# Patient Record
Sex: Female | Born: 1995 | Race: Black or African American | Hispanic: No | Marital: Single | Smoking: Former smoker
Health system: Southern US, Community
[De-identification: ages and names within clinical notes are randomized; demographics above are authoritative.]

## PROBLEM LIST (undated history)

## (undated) ENCOUNTER — Inpatient Hospital Stay (HOSPITAL_COMMUNITY): Payer: Self-pay

## (undated) ENCOUNTER — Emergency Department (HOSPITAL_COMMUNITY): Admission: EM | Source: Home / Self Care

## (undated) DIAGNOSIS — M1990 Unspecified osteoarthritis, unspecified site: Secondary | ICD-10-CM

## (undated) DIAGNOSIS — J45909 Unspecified asthma, uncomplicated: Secondary | ICD-10-CM

## (undated) HISTORY — PX: WISDOM TOOTH EXTRACTION: SHX21

## (undated) HISTORY — PX: TONSILLECTOMY: SUR1361

## (undated) HISTORY — PX: ADENOIDECTOMY: SUR15

---
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
## 2014-02-12 ENCOUNTER — Emergency Department (HOSPITAL_COMMUNITY)
Admission: EM | Admit: 2014-02-12 | Discharge: 2014-02-12 | Disposition: A | Discharge status: Home / Self Care | Payer: Medicaid Other | Attending: Emergency Medicine | Admitting: Emergency Medicine

## 2014-02-12 ENCOUNTER — Encounter (HOSPITAL_COMMUNITY): Payer: Self-pay | Admitting: *Deleted

## 2014-02-12 DIAGNOSIS — J029 Acute pharyngitis, unspecified: Secondary | ICD-10-CM | POA: Diagnosis present

## 2014-02-12 DIAGNOSIS — Z880 Allergy status to penicillin: Secondary | ICD-10-CM | POA: Insufficient documentation

## 2014-02-12 DIAGNOSIS — J329 Chronic sinusitis, unspecified: Secondary | ICD-10-CM | POA: Diagnosis not present

## 2014-02-12 DIAGNOSIS — B349 Viral infection, unspecified: Secondary | ICD-10-CM | POA: Diagnosis not present

## 2014-02-12 DIAGNOSIS — Z79899 Other long term (current) drug therapy: Secondary | ICD-10-CM | POA: Insufficient documentation

## 2014-02-12 DIAGNOSIS — H7493 Unspecified disorder of middle ear and mastoid, bilateral: Secondary | ICD-10-CM | POA: Diagnosis not present

## 2014-02-12 DIAGNOSIS — Z7952 Long term (current) use of systemic steroids: Secondary | ICD-10-CM | POA: Insufficient documentation

## 2014-02-12 DIAGNOSIS — B9789 Other viral agents as the cause of diseases classified elsewhere: Secondary | ICD-10-CM

## 2014-02-12 DIAGNOSIS — J45909 Unspecified asthma, uncomplicated: Secondary | ICD-10-CM | POA: Diagnosis not present

## 2014-02-12 DIAGNOSIS — Z7951 Long term (current) use of inhaled steroids: Secondary | ICD-10-CM | POA: Diagnosis not present

## 2014-02-12 DIAGNOSIS — R1110 Vomiting, unspecified: Secondary | ICD-10-CM | POA: Diagnosis not present

## 2014-02-12 DIAGNOSIS — M1990 Unspecified osteoarthritis, unspecified site: Secondary | ICD-10-CM | POA: Insufficient documentation

## 2014-02-12 DIAGNOSIS — R51 Headache: Secondary | ICD-10-CM | POA: Diagnosis not present

## 2014-02-12 MED ORDER — ONDANSETRON HCL 4 MG PO TABS: 4.0000 mg | ORAL_TABLET | Freq: Four times a day (QID) | ORAL | Status: DC

## 2014-02-12 MED ORDER — FLUTICASONE PROPIONATE 50 MCG/ACT NA SUSP: 2.0000 | Freq: Every day | NASAL | Status: DC

## 2014-02-12 MED ORDER — NAPROXEN 500 MG PO TABS
500.0000 mg | ORAL_TABLET | Freq: Once | ORAL | Status: AC
  Administered 2014-02-12: 500 mg via ORAL
  Filled 2014-02-12: qty 1

## 2014-02-12 MED ORDER — NAPROXEN 500 MG PO TABS: 500.0000 mg | ORAL_TABLET | Freq: Two times a day (BID) | ORAL | Status: DC

## 2014-02-12 MED ORDER — SALINE SPRAY 0.65 % NA SOLN
1.0000 | Freq: Once | NASAL | Status: AC
  Administered 2014-02-12: 1 via NASAL
  Filled 2014-02-12: qty 44

## 2014-02-12 MED ORDER — TRAMADOL HCL 50 MG PO TABS
50.0000 mg | ORAL_TABLET | Freq: Once | ORAL | Status: AC
  Administered 2014-02-12: 50 mg via ORAL
  Filled 2014-02-12: qty 1

## 2014-02-12 MED ORDER — ONDANSETRON HCL 4 MG PO TABS
4.0000 mg | ORAL_TABLET | Freq: Once | ORAL | Status: AC
  Administered 2014-02-12: 4 mg via ORAL
  Filled 2014-02-12: qty 1

## 2014-02-12 NOTE — Discharge Instructions (Signed)
Recommend that you use Flonase, 2 sprays in each nostril daily, for sinus congestion. Take naproxen for body aches and headache. You may also take this for fever. Take Zofran as prescribed for nausea to prevent further vomiting. Be sure to drink plenty of fluids and get plenty of rest. Follow-up with your primary care doctor to ensure resolution of symptoms.  Sinusitis Sinusitis is redness, soreness, and inflammation of the paranasal sinuses. Paranasal sinuses are air pockets within the bones of your face (beneath the eyes, the middle of the forehead, or above the eyes). In healthy paranasal sinuses, mucus is able to drain out, and air is able to circulate through them by way of your nose. However, when your paranasal sinuses are inflamed, mucus and air can become trapped. This can allow bacteria and other germs to grow and cause infection. Sinusitis can develop quickly and last only a short time (acute) or continue over a long period (chronic). Sinusitis that lasts for more than 12 weeks is considered chronic.  CAUSES  Causes of sinusitis include:  Allergies.  Structural abnormalities, such as displacement of the cartilage that separates your nostrils (deviated septum), which can decrease the air flow through your nose and sinuses and affect sinus drainage.  Functional abnormalities, such as when the small hairs (cilia) that line your sinuses and help remove mucus do not work properly or are not present. SIGNS AND SYMPTOMS  Symptoms of acute and chronic sinusitis are the same. The primary symptoms are pain and pressure around the affected sinuses. Other symptoms include:  Upper toothache.  Earache.  Headache.  Bad breath.  Decreased sense of smell and taste.  A cough, which worsens when you are lying flat.  Fatigue.  Fever.  Thick drainage from your nose, which often is green and may contain pus (purulent).  Swelling and warmth over the affected sinuses. DIAGNOSIS  Your health  care provider will perform a physical exam. During the exam, your health care provider may:  Look in your nose for signs of abnormal growths in your nostrils (nasal polyps).  Tap over the affected sinus to check for signs of infection.  View the inside of your sinuses (endoscopy) using an imaging device that has a light attached (endoscope). If your health care provider suspects that you have chronic sinusitis, one or more of the following tests may be recommended:  Allergy tests.  Nasal culture. A sample of mucus is taken from your nose, sent to a lab, and screened for bacteria.  Nasal cytology. A sample of mucus is taken from your nose and examined by your health care provider to determine if your sinusitis is related to an allergy. TREATMENT  Most cases of acute sinusitis are related to a viral infection and will resolve on their own within 10 days. Sometimes medicines are prescribed to help relieve symptoms (pain medicine, decongestants, nasal steroid sprays, or saline sprays).  However, for sinusitis related to a bacterial infection, your health care provider will prescribe antibiotic medicines. These are medicines that will help kill the bacteria causing the infection.  Rarely, sinusitis is caused by a fungal infection. In theses cases, your health care provider will prescribe antifungal medicine. For some cases of chronic sinusitis, surgery is needed. Generally, these are cases in which sinusitis recurs more than 3 times per year, despite other treatments. HOME CARE INSTRUCTIONS   Drink plenty of water. Water helps thin the mucus so your sinuses can drain more easily.  Use a humidifier.  Inhale steam 3  to 4 times a day (for example, sit in the bathroom with the shower running).  Apply a warm, moist washcloth to your face 3 to 4 times a day, or as directed by your health care provider.  Use saline nasal sprays to help moisten and clean your sinuses.  Take medicines only as  directed by your health care provider.  If you were prescribed either an antibiotic or antifungal medicine, finish it all even if you start to feel better. SEEK IMMEDIATE MEDICAL CARE IF:  You have increasing pain or severe headaches.  You have nausea, vomiting, or drowsiness.  You have swelling around your face.  You have vision problems.  You have a stiff neck.  You have difficulty breathing. MAKE SURE YOU:   Understand these instructions.  Will watch your condition.  Will get help right away if you are not doing well or get worse. Document Released: 02/11/2005 Document Revised: 06/28/2013 Document Reviewed: 02/26/2011 [facility] Patient Information 2015 [place], [place]. This information is not intended to replace advice given to you by your health care provider. Make sure you discuss any questions you have with your health care provider.

## 2014-02-12 NOTE — ED Notes (Addendum)
Pt reports "head cold", sore throat and h/a x 2 days.  Pt also c/o cough

## 2014-02-12 NOTE — ED Provider Notes (Signed)
CSN: [id number]     Arrival date & time 02/12/14  2102 History  This chart was scribed for non-physician practitioner, Antony MaduraKelly Carole Doner, PA-C, working with Rolan BuccoMelanie Belfi, MD by Evon Slackerrance Branch, ED Scribe. This patient was seen in room WTR9/WTR9 and the patient's care was started at 9:18 PM.    Chief Complaint  Patient presents with  . Sore Throat  . Generalized Body Aches   The history is provided by the patient. No language interpreter was used.   HPI Comments: Misty Sanders is a 18 y.o. female who presents to the Emergency Department complaining of sinus pressure onset 2 days prior. Pt states she has associated headache, sore throat, postnasal drip, congestion, rhinorrhea, subjective fever and vomiting x2 per day. Pt states she is not able to tolerate food or liquids secondary to nausea and emesis. She has had approximately 2 episodes of emesis per day over the last 48 hours. Pt states she has tried aleve that has provided some relief. She states she has tried Dayquil with no relief. Pt states she has also tried using her Nasonex with no relief. Pt states she is unsure of sick contacts. Denies difficulty swallowing, shortness of breath, drooling, abdominal pain, and diarrhea.  Past Medical History  Diagnosis Date  . Asthma   . Arthritis    Past Surgical History  Procedure Laterality Date  . Tonsillectomy    . Wisdom tooth extraction     Family History  Problem Relation Age of Onset  . Diabetes Mother   . Hypertension Mother   . Hyperlipidemia Mother    History  Substance Use Topics  . Smoking status: Never Smoker   . Smokeless tobacco: Not on file  . Alcohol Use: No   OB History    No data available      Review of Systems  Constitutional: Positive for fever.  HENT: Positive for congestion, postnasal drip, rhinorrhea, sinus pressure and sore throat. Negative for trouble swallowing.   Gastrointestinal: Positive for vomiting.  Neurological: Positive for headaches.  All  other systems reviewed and are negative.   Allergies  Peanuts; Penicillins; and Sulfa antibiotics  Home Medications   Prior to Admission medications   Medication Sig Start Date End Date Taking? Authorizing Provider  albuterol (PROVENTIL HFA;VENTOLIN HFA) 108 (90 BASE) MCG/ACT inhaler Inhale 1-2 puffs into the lungs every 6 (six) hours as needed for wheezing or shortness of breath.   Yes Historical Provider, MD  albuterol (PROVENTIL) (2.5 MG/3ML) 0.083% nebulizer solution Take 2.5 mg by nebulization every 6 (six) hours as needed for wheezing or shortness of breath.   Yes Historical Provider, MD  diphenhydrAMINE (BENADRYL) 25 mg capsule Take 1 capsule (25 mg total) by mouth every 6 (six) hours as needed for itching. 08/07/13  Yes Derwood KaplanAnkit Nanavati, MD  guaifenesin (ROBITUSSIN) 100 MG/5ML syrup Take 200 mg by mouth 3 (three) times daily as needed for cough.   Yes Historical Provider, MD  medroxyPROGESTERone (DEPO-PROVERA) 150 MG/ML injection Inject 150 mg into the muscle continuous.    Yes Historical Provider, MD  mometasone (NASONEX) 50 MCG/ACT nasal spray Place 2 sprays into the nose daily.   Yes Historical Provider, MD  naproxen sodium (ANAPROX) 220 MG tablet Take 440 mg by mouth 2 (two) times daily as needed (pain).   Yes Historical Provider, MD  Pseudoephedrine-Ibuprofen 30-200 MG TABS Take 2 tablets by mouth every 6 (six) hours as needed (cough).   Yes Historical Provider, MD  hydrocortisone cream 1 % Apply to affected  area 2 times daily Patient not taking: Reported on 02/12/2014 08/07/13   Derwood KaplanAnkit Nanavati, MD  naproxen (NAPROSYN) 500 MG tablet Take 1 tablet (500 mg total) by mouth 2 (two) times daily. 02/12/14   Antony MaduraKelly Pasha Gadison, PA-C  ondansetron (ZOFRAN) 4 MG tablet Take 1 tablet (4 mg total) by mouth every 6 (six) hours. 02/12/14   Antony MaduraKelly Dung Salinger, PA-C  predniSONE (DELTASONE) 50 MG tablet Take 1 tablet (50 mg total) by mouth daily. Patient not taking: Reported on 02/12/2014 08/07/13   Derwood KaplanAnkit Nanavati,  MD   Triage Vitals: BP 119/71 mmHg  Pulse 98  Temp(Src) 98.5 F (36.9 C) (Oral)  Resp 18  SpO2 100%  LMP 02/07/2014   Physical Exam  Constitutional: She is oriented to person, place, and time. She appears well-developed and well-nourished. No distress.  Nontoxic/nonseptic appearing  HENT:  Head: Normocephalic and atraumatic.  Right Ear: External ear and ear canal normal. Tympanic membrane is not retracted and not bulging. A middle ear effusion is present.  Left Ear: External ear and ear canal normal. Tympanic membrane is not retracted and not bulging. A middle ear effusion is present.  Nose: Right sinus exhibits maxillary sinus tenderness and frontal sinus tenderness. Left sinus exhibits maxillary sinus tenderness and frontal sinus tenderness.  Mouth/Throat: Uvula is midline, oropharynx is clear and moist and mucous membranes are normal.  +audible nasal congestion. B/l nares patent. Patient tolerating secretions without difficulty. Tonsils absent.  Eyes: Conjunctivae and EOM are normal. No scleral icterus.  Neck: Normal range of motion.  No nuchal rigidity or meningismus  Cardiovascular: Normal rate, regular rhythm and intact distal pulses.   Pulmonary/Chest: Effort normal and breath sounds normal. No respiratory distress. She has no wheezes. She has no rales.  Respirations even and unlabored.  Musculoskeletal: Normal range of motion.  Neurological: She is alert and oriented to person, place, and time. She exhibits normal muscle tone. Coordination normal.  GCS 15. Speech is goal oriented. Patient moves extremities without ataxia.  Skin: Skin is warm and dry. No rash noted. She is not diaphoretic. No erythema. No pallor.  Psychiatric: She has a normal mood and affect. Her behavior is normal.  Nursing note and vitals reviewed.   ED Course  Procedures (including critical care time) DIAGNOSTIC STUDIES: Oxygen Saturation is 100% on RA, normal by my interpretation.    COORDINATION OF  CARE: 9:40 PM-Discussed treatment plan with pt at bedside and pt agreed to plan.    Labs Review Labs Reviewed - No data to display  Imaging Review No results found.   EKG Interpretation None      MDM   Final diagnoses:  Viral sinusitis    Patient complaining of symptoms of sinusitis. Mild to moderate symptoms of clear/yellow nasal discharge/congestion and scratchy throat with cough for less than 10 days. Patient is afebrile. No concern for acute bacterial rhinosinusitis; likely viral in nature. Patient discharged with symptomatic treatment. Patient instructions given for warm saline nasal washes. Recommendations for follow-up with primary care physician. Patient discharged in good condition with no unaddressed concerns.  I personally performed the services described in this documentation, which was scribed in my presence. The recorded information has been reviewed and is accurate.   Filed Vitals:   02/12/14 2128  BP: 119/71  Pulse: 98  Temp: 98.5 F (36.9 C)  TempSrc: Oral  Resp: 18  SpO2: 100%      Antony MaduraKelly Drayke Grabel, PA-C 02/12/14 2222  Rolan BuccoMelanie Belfi, MD 02/12/14 2358

## 2014-06-05 ENCOUNTER — Encounter (HOSPITAL_COMMUNITY): Payer: Self-pay | Admitting: Emergency Medicine

## 2014-06-05 ENCOUNTER — Emergency Department (HOSPITAL_COMMUNITY): Payer: Medicaid Other

## 2014-06-05 ENCOUNTER — Emergency Department (HOSPITAL_COMMUNITY)
Admission: EM | Admit: 2014-06-05 | Discharge: 2014-06-05 | Disposition: A | Discharge status: Home / Self Care | Payer: Medicaid Other | Attending: Emergency Medicine | Admitting: Emergency Medicine

## 2014-06-05 DIAGNOSIS — Z880 Allergy status to penicillin: Secondary | ICD-10-CM | POA: Insufficient documentation

## 2014-06-05 DIAGNOSIS — Y9372 Activity, wrestling: Secondary | ICD-10-CM | POA: Diagnosis not present

## 2014-06-05 DIAGNOSIS — W2201XA Walked into wall, initial encounter: Secondary | ICD-10-CM | POA: Insufficient documentation

## 2014-06-05 DIAGNOSIS — Z79899 Other long term (current) drug therapy: Secondary | ICD-10-CM | POA: Insufficient documentation

## 2014-06-05 DIAGNOSIS — Y9289 Other specified places as the place of occurrence of the external cause: Secondary | ICD-10-CM | POA: Diagnosis not present

## 2014-06-05 DIAGNOSIS — Y998 Other external cause status: Secondary | ICD-10-CM | POA: Diagnosis not present

## 2014-06-05 DIAGNOSIS — S2232XA Fracture of one rib, left side, initial encounter for closed fracture: Secondary | ICD-10-CM | POA: Diagnosis not present

## 2014-06-05 DIAGNOSIS — S29001A Unspecified injury of muscle and tendon of front wall of thorax, initial encounter: Secondary | ICD-10-CM | POA: Diagnosis present

## 2014-06-05 DIAGNOSIS — Z8739 Personal history of other diseases of the musculoskeletal system and connective tissue: Secondary | ICD-10-CM | POA: Insufficient documentation

## 2014-06-05 DIAGNOSIS — J45909 Unspecified asthma, uncomplicated: Secondary | ICD-10-CM | POA: Insufficient documentation

## 2014-06-05 DIAGNOSIS — Z3202 Encounter for pregnancy test, result negative: Secondary | ICD-10-CM | POA: Insufficient documentation

## 2014-06-05 DIAGNOSIS — Z720 Tobacco use: Secondary | ICD-10-CM | POA: Diagnosis not present

## 2014-06-05 LAB — POC URINE PREG, ED: Preg Test, Ur: NEGATIVE

## 2014-06-05 MED ORDER — HYDROCODONE-ACETAMINOPHEN 5-325 MG PO TABS: 1.0000 | ORAL_TABLET | Freq: Four times a day (QID) | ORAL | Status: DC | PRN

## 2014-06-05 MED ORDER — IBUPROFEN 800 MG PO TABS: 800.0000 mg | ORAL_TABLET | Freq: Three times a day (TID) | ORAL | Status: DC | PRN

## 2014-06-05 NOTE — ED Provider Notes (Signed)
CSN: [id number]     Arrival date & time 06/05/14  1227 History   First MD Initiated Contact with Patient 06/05/14 1255     Chief Complaint  Patient presents with  . Chest Pain     (Consider location/radiation/quality/duration/timing/severity/associated sxs/prior Treatment) HPI Patient presents to the emergency department with upper left chest discomfort that started 2 days ago after roughhousing and wrestling with her cousin.  The patient states she was hit up against a wall and that is when the pain started.  She stated initially it started in her back and wrapped around to the front.  The main pain is in the front part of her upper chest.  She states that it hurts to take a deep breath and palpate the area over the rib.  She denies nausea, vomiting, weakness, dizziness, headache, blurred vision, back pain, neck pain, cough, runny nose, sore throat, fever, shortness of breath or syncope.  The patient states she did not take any medications prior to arrival Past Medical History  Diagnosis Date  . Asthma   . Arthritis    Past Surgical History  Procedure Laterality Date  . Tonsillectomy    . Wisdom tooth extraction     Family History  Problem Relation Age of Onset  . Diabetes Mother   . Hypertension Mother   . Hyperlipidemia Mother    History  Substance Use Topics  . Smoking status: Current Every Day Smoker    Types: Cigarettes  . Smokeless tobacco: Not on file  . Alcohol Use: No   OB History    No data available     Review of Systems  All other systems negative except as documented in the HPI. All pertinent positives and negatives as reviewed in the HPI.  Allergies  Peanuts; Penicillins; and Sulfa antibiotics  Home Medications   Prior to Admission medications   Medication Sig Start Date End Date Taking? Authorizing Provider  albuterol (PROVENTIL HFA;VENTOLIN HFA) 108 (90 BASE) MCG/ACT inhaler Inhale 1-2 puffs into the lungs every 6 (six) hours as needed for wheezing  or shortness of breath.   Yes Historical Provider, MD  ibuprofen (ADVIL,MOTRIN) 200 MG tablet Take 400 mg by mouth every 6 (six) hours as needed for mild pain.   Yes Historical Provider, MD  naproxen sodium (ANAPROX) 220 MG tablet Take 440 mg by mouth 2 (two) times daily as needed (pain).   Yes Historical Provider, MD  diphenhydrAMINE (BENADRYL) 25 mg capsule Take 1 capsule (25 mg total) by mouth every 6 (six) hours as needed for itching. Patient not taking: Reported on 06/05/2014 08/07/13   Derwood KaplanAnkit Nanavati, MD  hydrocortisone cream 1 % Apply to affected area 2 times daily Patient not taking: Reported on 02/12/2014 08/07/13   Derwood KaplanAnkit Nanavati, MD  medroxyPROGESTERone (DEPO-PROVERA) 150 MG/ML injection Inject 150 mg into the muscle continuous.     Historical Provider, MD  naproxen (NAPROSYN) 500 MG tablet Take 1 tablet (500 mg total) by mouth 2 (two) times daily. Patient not taking: Reported on 06/05/2014 02/12/14   Antony MaduraKelly Humes, PA-C  ondansetron (ZOFRAN) 4 MG tablet Take 1 tablet (4 mg total) by mouth every 6 (six) hours. Patient not taking: Reported on 06/05/2014 02/12/14   Antony MaduraKelly Humes, PA-C  predniSONE (DELTASONE) 50 MG tablet Take 1 tablet (50 mg total) by mouth daily. Patient not taking: Reported on 02/12/2014 08/07/13   Derwood KaplanAnkit Nanavati, MD   BP 104/56 mmHg  Pulse 81  Temp(Src) 98.2 F (36.8 C)  Resp 16  Ht   (1.676 m)  Wt 151 lb (68.493 kg)  BMI 24.38 kg/m2  SpO2 100% Physical Exam  Constitutional: She is oriented to person, place, and time. She appears well-developed and well-nourished. No distress.  HENT:  Head: Normocephalic and atraumatic.  Mouth/Throat: Oropharynx is clear and moist.  Eyes: Pupils are equal, round, and reactive to light.  Neck: Normal range of motion. Neck supple.  Cardiovascular: Normal rate, regular rhythm and normal heart sounds.  Exam reveals no gallop and no friction rub.   No murmur heard. Pulmonary/Chest: Effort normal and breath sounds normal. No  respiratory distress. She has no decreased breath sounds. She has no wheezes. She exhibits tenderness and bony tenderness.    Musculoskeletal: She exhibits no edema.  Neurological: She is alert and oriented to person, place, and time. She exhibits normal muscle tone. Coordination normal.  Skin: Skin is warm and dry. No rash noted. No erythema.  Nursing note and vitals reviewed.   ED Course  Procedures (including critical care time) Labs Review Labs Reviewed - No data to display  Imaging Review No results found.   EKG Interpretation   Date/Time:  Sunday June 05 2014 12:34:24 EDT Ventricular Rate:  71 PR Interval:  155 QRS Duration: 66 QT Interval:  355 QTC Calculation: 386 R Axis:   75 Text Interpretation:  Sinus rhythm Baseline wander When compared with ECG  of 05/01/2013 No significant change was found Confirmed by [facility]  MD,  Nicholos Johns [phone]) on 06/05/2014 1:01:19 PM      MDM   Final diagnoses:  None   patient be treated for a second rib fracture on the left.  Told to return here as needed.  The patient agrees the plan.  All questions are answered.  Also advised to follow-up with her primary care Dr. for recheck      Charlestine Night, PA-C 06/08/14 1523  Samuel Jester, DO 06/08/14 1553

## 2014-06-05 NOTE — ED Notes (Signed)
Pt c/o upper left side chest pain x 2 days.  Pt states that couple days ago she was wrestling with her cousin and hit the wall. Pt states that pain originated in her back and moved around to the front now. Pt states that pain is worse with movement and coughing.

## 2014-06-05 NOTE — Discharge Instructions (Signed)
REturn here as needed. Follow up with your doctor for a recheck °

## 2014-11-18 ENCOUNTER — Emergency Department (HOSPITAL_COMMUNITY)
Admission: EM | Admit: 2014-11-18 | Discharge: 2014-11-18 | Disposition: A | Discharge status: Home / Self Care | Payer: Medicaid Other | Attending: Emergency Medicine | Admitting: Emergency Medicine

## 2014-11-18 ENCOUNTER — Encounter (HOSPITAL_COMMUNITY): Payer: Self-pay | Admitting: *Deleted

## 2014-11-18 DIAGNOSIS — Z79899 Other long term (current) drug therapy: Secondary | ICD-10-CM | POA: Diagnosis not present

## 2014-11-18 DIAGNOSIS — L293 Anogenital pruritus, unspecified: Secondary | ICD-10-CM | POA: Diagnosis present

## 2014-11-18 DIAGNOSIS — J45909 Unspecified asthma, uncomplicated: Secondary | ICD-10-CM | POA: Diagnosis not present

## 2014-11-18 DIAGNOSIS — Z3202 Encounter for pregnancy test, result negative: Secondary | ICD-10-CM | POA: Diagnosis not present

## 2014-11-18 DIAGNOSIS — Z720 Tobacco use: Secondary | ICD-10-CM | POA: Diagnosis not present

## 2014-11-18 DIAGNOSIS — M1990 Unspecified osteoarthritis, unspecified site: Secondary | ICD-10-CM | POA: Insufficient documentation

## 2014-11-18 DIAGNOSIS — B9689 Other specified bacterial agents as the cause of diseases classified elsewhere: Secondary | ICD-10-CM

## 2014-11-18 DIAGNOSIS — Z880 Allergy status to penicillin: Secondary | ICD-10-CM | POA: Insufficient documentation

## 2014-11-18 DIAGNOSIS — N760 Acute vaginitis: Secondary | ICD-10-CM | POA: Insufficient documentation

## 2014-11-18 LAB — WET PREP, GENITAL
TRICH WET PREP: NONE SEEN
Yeast Wet Prep HPF POC: NONE SEEN

## 2014-11-18 LAB — PREGNANCY, URINE: Preg Test, Ur: NEGATIVE

## 2014-11-18 MED ORDER — IBUPROFEN 200 MG PO TABS: 600.0000 mg | ORAL_TABLET | Freq: Once | ORAL | Status: DC

## 2014-11-18 MED ORDER — METRONIDAZOLE 500 MG PO TABS: 500.0000 mg | ORAL_TABLET | Freq: Two times a day (BID) | ORAL | Status: DC

## 2014-11-18 NOTE — Progress Notes (Signed)
Pt states she had previous seen Dr Renaye Rakers but is looking for another pcp Pt encouraged to Please contact the Dept of Social services case worker to have a local medicaid accepting primary care doctor entered pror to going to one from list Cm also offered pt a list of [place] uninsured providers and resources CM discussed and provided written information for uninsured accepting pcps, discussed the importance of pcp vs EDP services for f/u care, www.needymeds.org, www.goodrx.com, discounted pharmacies and other [employer] such as [employer] [facility] [facility] , [employer], affordable care act, financial assistance, uninsured dental services, [place] med assist, DSS and  health department  Reviewed resources for [employer] uninsured accepting pcps like [facility], family medicine at [facility] [employer] [employer], [facility], [facility], local urgent care centers, [facility], [place] [facility], general medical clinics, family services of the [place], [facility] urgent care plus others, medication resources, [facility] out patient pharmacies and housing Pt voiced understanding and appreciation of resources provided   Provided [facility] contact information

## 2014-11-18 NOTE — ED Notes (Signed)
Pt presents to ed with c/o vaginal itching and discomfort x 4-5 days. Pt denies pain, sts "I don't know how to explain, it's not painful it's just discomfort". Pt sts she is sexually active and use depo as bc.

## 2014-11-18 NOTE — Discharge Instructions (Signed)
Bacterial Vaginosis Bacterial vaginosis is a vaginal infection that occurs when the normal balance of bacteria in the vagina is disrupted. It results from an overgrowth of certain bacteria. This is the most common vaginal infection in women of childbearing age. Treatment is important to prevent complications, especially in pregnant women, as it can cause a premature delivery. CAUSES  Bacterial vaginosis is caused by an increase in harmful bacteria that are normally present in smaller amounts in the vagina. Several different kinds of bacteria can cause bacterial vaginosis. However, the reason that the condition develops is not fully understood. RISK FACTORS Certain activities or behaviors can put you at an increased risk of developing bacterial vaginosis, including:  Having a new sex partner or multiple sex partners.  Douching.  Using an intrauterine device (IUD) for contraception. Women do not get bacterial vaginosis from toilet seats, bedding, swimming pools, or contact with objects around them. SIGNS AND SYMPTOMS  Some women with bacterial vaginosis have no signs or symptoms. Common symptoms include:  Grey vaginal discharge.  A fishlike odor with discharge, especially after sexual intercourse.  Itching or burning of the vagina and vulva.  Burning or pain with urination. DIAGNOSIS  Your health care provider will take a medical history and examine the vagina for signs of bacterial vaginosis. A sample of vaginal fluid may be taken. Your health care provider will look at this sample under a microscope to check for bacteria and abnormal cells. A vaginal pH test may also be done.  TREATMENT  Bacterial vaginosis may be treated with antibiotic medicines. These may be given in the form of a pill or a vaginal cream. A second round of antibiotics may be prescribed if the condition comes back after treatment.  HOME CARE INSTRUCTIONS   Only take over-the-counter or prescription medicines as  directed by your health care provider.  If antibiotic medicine was prescribed, take it as directed. Make sure you finish it even if you start to feel better.  Do not have sex until treatment is completed.  Tell all sexual partners that you have a vaginal infection. They should see their health care provider and be treated if they have problems, such as a mild rash or itching.  Practice safe sex by using condoms and only having one sex partner. SEEK MEDICAL CARE IF:   Your symptoms are not improving after 3 days of treatment.  You have increased discharge or pain.  You have a fever. MAKE SURE YOU:   Understand these instructions.  Will watch your condition.  Will get help right away if you are not doing well or get worse. FOR MORE INFORMATION  Centers for Disease Control and Prevention, Division of STD Prevention: www.cdc.gov/std American Sexual Health Association (ASHA): www.ashastd.org  Document Released: 02/11/2005 Document Revised: 12/02/2012 Document Reviewed: 09/23/2012 ExitCare Patient Information 2015 Exit[employer]. This information is not intended to replace advice given to you by your health care provider. Make sure you discuss any questions you have with your health care provider.  

## 2014-11-18 NOTE — ED Provider Notes (Signed)
CSN: [id number]     Arrival date & time 11/18/14  1335 History   First MD Initiated Contact with Patient 11/18/14 1524     Chief Complaint  Patient presents with  . Vaginal Itching     HPI Patient presents to emergency department with complaints of vaginal itching or vaginal discomfort.  No new sexual partners.  Denies dysuria or urinary frequency.  No herpetic lesions of the nose by the patient.  No history of herpes.   Past Medical History  Diagnosis Date  . Asthma   . Arthritis    Past Surgical History  Procedure Laterality Date  . Tonsillectomy    . Wisdom tooth extraction     Family History  Problem Relation Age of Onset  . Diabetes Mother   . Hypertension Mother   . Hyperlipidemia Mother    Social History  Substance Use Topics  . Smoking status: Current Every Day Smoker    Types: Cigarettes  . Smokeless tobacco: None  . Alcohol Use: No   OB History    No data available     Review of Systems  All other systems reviewed and are negative.     Allergies  Peanuts; Penicillins; and Sulfa antibiotics  Home Medications   Prior to Admission medications   Medication Sig Start Date End Date Taking? Authorizing Provider  albuterol (PROVENTIL HFA;VENTOLIN HFA) 108 (90 BASE) MCG/ACT inhaler Inhale 1-2 puffs into the lungs every 6 (six) hours as needed for wheezing or shortness of breath.   Yes Historical Provider, MD  diphenhydrAMINE (BENADRYL) 25 mg capsule Take 1 capsule (25 mg total) by mouth every 6 (six) hours as needed for itching. 08/07/13  Yes Derwood Kaplan, MD  medroxyPROGESTERone (DEPO-PROVERA) 150 MG/ML injection Inject 150 mg into the muscle every 3 (three) months.    Yes Historical Provider, MD  predniSONE (DELTASONE) 50 MG tablet Take 1 tablet (50 mg total) by mouth daily. Patient taking differently: Take 50 mg by mouth daily as needed (asthema).  08/07/13  Yes Ankit Nanavati, MD   BP 111/66 mmHg  Pulse 68  Temp(Src) 98.1 F (36.7 C) (Oral)  Resp 15   SpO2 100% Physical Exam  Constitutional: She is oriented to person, place, and time. She appears well-developed and well-nourished. No distress.  HENT:  Head: Normocephalic and atraumatic.  Eyes: EOM are normal.  Neck: Normal range of motion.  Cardiovascular: Normal rate, regular rhythm and normal heart sounds.   Pulmonary/Chest: Effort normal and breath sounds normal.  Abdominal: Soft. She exhibits no distension. There is no tenderness.  Genitourinary:  Normal external genitalia.  No lesions noted.  Moderate vaginal discharge noted.  No significant cervical motion tenderness.  No adnexal masses or fullness.  Musculoskeletal: Normal range of motion.  Neurological: She is alert and oriented to person, place, and time.  Skin: Skin is warm and dry.  Psychiatric: She has a normal mood and affect. Judgment normal.  Nursing note and vitals reviewed.   ED Course  Procedures (including critical care time) Labs Review Labs Reviewed  WET PREP, GENITAL - Abnormal; Notable for the following:    Clue Cells Wet Prep HPF POC MANY (*)    WBC, Wet Prep HPF POC MANY (*)    All other components within normal limits  PREGNANCY, URINE  GC/CHLAMYDIA PROBE AMP ([place]) NOT AT [facility]    Imaging Review No results found. I have personally reviewed and evaluated these images and lab results as part of my medical decision-making.  EKG Interpretation None      MDM   Final diagnoses:  None   Suspect bacterial vaginosis.  Discharge home with Flagyl.    Azalia Bilis, MD 11/18/14 [phone]

## 2014-11-21 LAB — GC/CHLAMYDIA PROBE AMP (~~LOC~~) NOT AT ARMC
Chlamydia: NEGATIVE
NEISSERIA GONORRHEA: NEGATIVE

## 2015-09-03 ENCOUNTER — Emergency Department (HOSPITAL_COMMUNITY)
Admission: EM | Admit: 2015-09-03 | Discharge: 2015-09-03 | Disposition: A | Discharge status: Home / Self Care | Payer: Self-pay | Attending: Emergency Medicine | Admitting: Emergency Medicine

## 2015-09-03 ENCOUNTER — Ambulatory Visit (HOSPITAL_COMMUNITY)
Admission: EM | Admit: 2015-09-03 | Discharge: 2015-09-03 | Discharge status: Absent without leave | Payer: No Typology Code available for payment source

## 2015-09-03 ENCOUNTER — Encounter (HOSPITAL_COMMUNITY): Payer: Self-pay

## 2015-09-03 DIAGNOSIS — Z91010 Allergy to peanuts: Secondary | ICD-10-CM | POA: Insufficient documentation

## 2015-09-03 DIAGNOSIS — J45909 Unspecified asthma, uncomplicated: Secondary | ICD-10-CM | POA: Insufficient documentation

## 2015-09-03 DIAGNOSIS — Z79899 Other long term (current) drug therapy: Secondary | ICD-10-CM | POA: Insufficient documentation

## 2015-09-03 DIAGNOSIS — Z91040 Latex allergy status: Secondary | ICD-10-CM | POA: Insufficient documentation

## 2015-09-03 DIAGNOSIS — F17210 Nicotine dependence, cigarettes, uncomplicated: Secondary | ICD-10-CM | POA: Insufficient documentation

## 2015-09-03 DIAGNOSIS — N6489 Other specified disorders of breast: Secondary | ICD-10-CM | POA: Insufficient documentation

## 2015-09-03 DIAGNOSIS — N644 Mastodynia: Secondary | ICD-10-CM

## 2015-09-03 DIAGNOSIS — Z3202 Encounter for pregnancy test, result negative: Secondary | ICD-10-CM | POA: Insufficient documentation

## 2015-09-03 LAB — URINE MICROSCOPIC-ADD ON: WBC UA: NONE SEEN WBC/hpf (ref 0–5)

## 2015-09-03 LAB — I-STAT BETA HCG BLOOD, ED (MC, WL, AP ONLY): I-stat hCG, quantitative: <5 m[IU]/mL (ref <5)

## 2015-09-03 LAB — URINALYSIS, ROUTINE W REFLEX MICROSCOPIC
BILIRUBIN URINE: NEGATIVE
GLUCOSE, UA: NEGATIVE mg/dL
KETONES UR: NEGATIVE mg/dL
Leukocytes, UA: NEGATIVE
Nitrite: NEGATIVE
PROTEIN: NEGATIVE mg/dL
Specific Gravity, Urine: 1.021 (ref 1.005–1.030)
pH: 7 (ref 5.0–8.0)

## 2015-09-03 NOTE — Discharge Instructions (Signed)

## 2015-09-03 NOTE — ED Notes (Addendum)
Patient complains of 3 days of breast tenderness and spotting, usually has no bleeding since on Depo, took home preg test several days ago that was negative. No discharge, no abdominal complaints, no cramping.

## 2015-09-03 NOTE — ED Provider Notes (Signed)
CSN: [id number]     Arrival date & time 09/03/15  1307 History   First MD Initiated Contact with Patient 09/03/15 1322     Chief Complaint  Patient presents with  . Pregnancy test   . breast soreness      (Consider location/radiation/quality/duration/timing/severity/associated sxs/prior Treatment) HPI   20 y.o. female presents today G0 P0 complaining of breast tenderness and thinks she may be pregnant. Unknown last menstrual period as she has been on Her shots. She states that her last shot was in March. She has not had any abdominal pain, vaginal discharge, or cramping. She feels that the right breast is somewhat more tender than the left breast. She has not noted any discharge, redness, or fluctuance. She has not had any similar symptoms in the past. Past Medical History  Diagnosis Date  . Asthma   . Arthritis    Past Surgical History  Procedure Laterality Date  . Tonsillectomy    . Wisdom tooth extraction     Family History  Problem Relation Age of Onset  . Diabetes Mother   . Hypertension Mother   . Hyperlipidemia Mother    Social History  Substance Use Topics  . Smoking status: Current Every Day Smoker    Types: Cigarettes  . Smokeless tobacco: None  . Alcohol Use: No   OB History    No data available     Review of Systems  All other systems reviewed and are negative.     Allergies  Peanuts; Penicillins; Sulfa antibiotics; and Latex  Home Medications   Prior to Admission medications   Medication Sig Start Date End Date Taking? Authorizing Provider  albuterol (PROVENTIL HFA;VENTOLIN HFA) 108 (90 BASE) MCG/ACT inhaler Inhale 1-2 puffs into the lungs every 6 (six) hours as needed for wheezing or shortness of breath.    Historical Provider, MD  diphenhydrAMINE (BENADRYL) 25 mg capsule Take 1 capsule (25 mg total) by mouth every 6 (six) hours as needed for itching. 08/07/13   Derwood KaplanAnkit Nanavati, MD  medroxyPROGESTERone (DEPO-PROVERA) 150 MG/ML injection Inject 150 mg  into the muscle every 3 (three) months.     Historical Provider, MD  metroNIDAZOLE (FLAGYL) 500 MG tablet Take 1 tablet (500 mg total) by mouth 2 (two) times daily. 11/18/14   Azalia BilisKevin Campos, MD  predniSONE (DELTASONE) 50 MG tablet Take 1 tablet (50 mg total) by mouth daily. Patient taking differently: Take 50 mg by mouth daily as needed (asthema).  08/07/13   Ankit Nanavati, MD   BP 104/59 mmHg  Pulse 61  Temp(Src) 98 F (36.7 C) (Oral)  Resp 16  SpO2 100% Physical Exam  Constitutional: She is oriented to person, place, and time. She appears well-developed and well-nourished. No distress.  HENT:  Head: Normocephalic and atraumatic.  Right Ear: External ear normal.  Left Ear: External ear normal.  Nose: Nose normal.  Eyes: Conjunctivae and EOM are normal. Pupils are equal, round, and reactive to light.  Neck: Normal range of motion. Neck supple.  Pulmonary/Chest: Effort normal.  Breasts are mildly tender bilaterally with no masses, or fluctuance or erythema noted.  Abdominal: Soft. Bowel sounds are normal.  Musculoskeletal: Normal range of motion.  Neurological: She is alert and oriented to person, place, and time. She exhibits normal muscle tone. Coordination normal.  Skin: Skin is warm and dry.  Psychiatric: She has a normal mood and affect. Her behavior is normal. Thought content normal.  Nursing note and vitals reviewed.   ED Course  Procedures (including  critical care time) Labs Review Labs Reviewed  URINALYSIS, ROUTINE W REFLEX MICROSCOPIC (NOT AT [facility]) - Abnormal; Notable for the following:    APPearance CLOUDY (*)    Hgb urine dipstick SMALL (*)    All other components within normal limits  URINE MICROSCOPIC-ADD ON - Abnormal; Notable for the following:    Squamous Epithelial / LPF 6-30 (*)    Bacteria, UA RARE (*)    All other components within normal limits  I-STAT BETA HCG BLOOD, ED ([facility], [facility], [facility] ONLY)    Imaging Review No results found. I have personally reviewed  and evaluated these images and lab results as part of my medical decision-making.   EKG Interpretation None      MDM   Final diagnoses:  Breast tenderness in female   Patient counseled regarding need for birth control and follow-up and voices understanding    Margarita Grizzle, MD 09/03/15 1512

## 2015-11-22 ENCOUNTER — Inpatient Hospital Stay (HOSPITAL_COMMUNITY)
Admission: AD | Admit: 2015-11-22 | Discharge: 2015-11-22 | Disposition: A | Discharge status: Home / Self Care | Payer: Self-pay | Source: Ambulatory Visit | Attending: Family Medicine | Admitting: Family Medicine

## 2015-11-22 ENCOUNTER — Encounter (HOSPITAL_COMMUNITY): Payer: Self-pay | Admitting: *Deleted

## 2015-11-22 DIAGNOSIS — A5901 Trichomonal vulvovaginitis: Secondary | ICD-10-CM | POA: Insufficient documentation

## 2015-11-22 DIAGNOSIS — F17210 Nicotine dependence, cigarettes, uncomplicated: Secondary | ICD-10-CM | POA: Insufficient documentation

## 2015-11-22 DIAGNOSIS — A599 Trichomoniasis, unspecified: Secondary | ICD-10-CM

## 2015-11-22 DIAGNOSIS — N730 Acute parametritis and pelvic cellulitis: Secondary | ICD-10-CM | POA: Insufficient documentation

## 2015-11-22 DIAGNOSIS — Z880 Allergy status to penicillin: Secondary | ICD-10-CM | POA: Insufficient documentation

## 2015-11-22 LAB — WET PREP, GENITAL
Sperm: NONE SEEN
Yeast Wet Prep HPF POC: NONE SEEN

## 2015-11-22 LAB — URINALYSIS, ROUTINE W REFLEX MICROSCOPIC
BILIRUBIN URINE: NEGATIVE
GLUCOSE, UA: NEGATIVE mg/dL
KETONES UR: NEGATIVE mg/dL
Nitrite: NEGATIVE
Protein, ur: NEGATIVE mg/dL
Specific Gravity, Urine: >1.03 — ABNORMAL HIGH (ref 1.005–1.030)
pH: 6 (ref 5.0–8.0)

## 2015-11-22 LAB — URINE MICROSCOPIC-ADD ON

## 2015-11-22 LAB — POCT PREGNANCY, URINE: Preg Test, Ur: NEGATIVE

## 2015-11-22 MED ORDER — AZITHROMYCIN 250 MG PO TABS
1000.0000 mg | ORAL_TABLET | Freq: Once | ORAL | Status: AC
  Administered 2015-11-22: 1000 mg via ORAL
  Filled 2015-11-22: qty 4

## 2015-11-22 MED ORDER — AZITHROMYCIN 500 MG PO TABS: 1000.0000 mg | ORAL_TABLET | Freq: Once | ORAL | 0 refills | Status: AC

## 2015-11-22 MED ORDER — METRONIDAZOLE 500 MG PO TABS
2000.0000 mg | ORAL_TABLET | Freq: Once | ORAL | Status: AC
  Administered 2015-11-22: 2000 mg via ORAL
  Filled 2015-11-22: qty 4

## 2015-11-22 MED ORDER — MOXIFLOXACIN HCL 400 MG PO TABS: 400.0000 mg | ORAL_TABLET | Freq: Every day | ORAL | 0 refills | Status: AC

## 2015-11-22 NOTE — Discharge Instructions (Signed)
Pelvic Inflammatory Disease °Pelvic inflammatory disease (PID) refers to an infection in some or all of the female organs. The infection can be in the uterus, ovaries, fallopian tubes, or the surrounding tissues in the pelvis. PID can cause abdominal or pelvic pain that comes on suddenly (acute pelvic pain). PID is a serious infection because it can lead to lasting (chronic) pelvic pain or the inability to have children (infertility). °CAUSES °This condition is most often caused by an infection that is spread during sexual contact. However, the infection can also be caused by the normal bacteria that are found in the vaginal tissues if these bacteria travel upward into the reproductive organs. PID can also occur following: °· The birth of a baby. °· A miscarriage. °· An abortion. °· Major pelvic surgery. °· The use of an intrauterine device (IUD). °· A sexual assault. °RISK FACTORS °This condition is more likely to develop in women who: °· Are younger than 21 years of age. °· Are sexually active at a young age. °· Use nonbarrier contraception. °· Have multiple sexual partners. °· Have sex with someone who has symptoms of an STD (sexually transmitted disease). °· Use oral contraception. °At times, certain behaviors can also increase the possibility of getting PID, such as: °· Using a vaginal douche. °· Having an IUD in place. °SYMPTOMS °Symptoms of this condition include: °· Abdominal or pelvic pain. °· Fever. °· Chills. °· Abnormal vaginal discharge. °· Abnormal uterine bleeding. °· Unusual pain shortly after the end of a menstrual period. °· Painful urination. °· Pain with sexual intercourse. °· Nausea and vomiting. °DIAGNOSIS °To diagnose this condition, your health care provider will do a physical exam and take your medical history. A pelvic exam typically reveals great tenderness in the uterus and the surrounding pelvic tissues. You may also have tests, such as: °· Lab tests, including a pregnancy test, blood  tests, and urine test. °· Culture tests of the vagina and cervix to check for an STD. °· Ultrasound. °· A laparoscopic procedure to look inside the pelvis. °· Examining vaginal secretions under a microscope. °TREATMENT °Treatment for this condition may involve one or more approaches. °· Antibiotic medicines may be prescribed to be taken by mouth. °· Sexual partners may need to be treated if the infection is caused by an STD. °· For more severe cases, hospitalization may be needed to give antibiotics directly into a vein through an IV tube. °· Surgery may be needed if other treatments do not help, but this is rare. °It may take weeks until you are completely well. If you are diagnosed with PID, you should also be checked for human immunodeficiency virus (HIV). Your health care provider may test you for infection again 3 months after treatment. You should not have unprotected sex. °HOME CARE INSTRUCTIONS °· Take over-the-counter and prescription medicines only as told by your health care provider. °· If you were prescribed an antibiotic medicine, take it as told by your health care provider. Do not stop taking the antibiotic even if you start to feel better. °· Do not have sexual intercourse until treatment is completed or as told by your health care provider. If PID is confirmed, your recent sexual partners will need treatment, especially if you had unprotected sex. °· Keep all follow-up visits as told by your health care provider. This is important. °SEEK MEDICAL CARE IF: °· You have increased or abnormal vaginal discharge. °· Your pain does not improve. °· You vomit. °· You have a fever. °· You   cannot tolerate your medicines.  Your partner has an STD.  You have pain when you urinate. SEEK IMMEDIATE MEDICAL CARE IF:  You have increased abdominal or pelvic pain.  You have chills.  Your symptoms are not better in 72 hours even with treatment.   This information is not intended to replace advice given to  you by your health care provider. Make sure you discuss any questions you have with your health care provider.   Document Released: 02/11/2005 Document Revised: 11/02/2014 Document Reviewed: 03/21/2014 Elsevier Interactive Patient Education 2016 Else[employer] Inc.  Trichomonas Test The trichomonas test is done to diagnose trichomoniasis, an infection caused by an organism called Trichomonas. Trichomoniasis is a sexually transmitted infection (STI). In women, it causes vaginal infections. In men, it can cause the tube that carries urine (urethra) to become inflamed (urethritis). You may have this test as a part of a routine screening for STIs or if you have symptoms of trichomoniasis. To perform the test, your health care provider will take a sample of discharge. The sample is taken from the vagina or cervix in women and from the urethra in men. A urine sample can also be used for testing. RESULTS It is your responsibility to obtain your test results. Ask the lab or department performing the test when and how you will get your results. Contact your health care provider to discuss any questions you have about your results.  Meaning of Negative Test Results A negative test means you do not have trichomoniasis. Follow your health care provider's directions about any follow-up testing.  Meaning of Positive Test Results A positive test result means you have an active infection that needs to be treated with antibiotic medicine. All your current sexual partners must also be treated or it is likely you will get reinfected.  If your test is positive, your health care provider will start you on medicine and may advise you to:  Not have sexual intercourse until your infection has cleared up.  Use a latex condom properly every time you have sexual intercourse.  Limit the number of sexual partners you have. The more partners you have, the greater your risk of contracting trichomoniasis or another STI.  Tell  all sexual partners about your infection so they can also be treated and to prevent reinfection.   This information is not intended to replace advice given to you by your health care provider. Make sure you discuss any questions you have with your health care provider.   Document Released: 03/16/2004 Document Revised: 03/04/2014 Document Reviewed: 02/23/2013 Elsevier Interactive Patient Education [employer] Inc[employer]

## 2015-11-22 NOTE — MAU Note (Signed)
Pt states she has a yeast infection which is very uncomfortable.  Unable to get an appointment with her PCP.  Has vaginal irritation & itching, has white discharge, is on her period right now.

## 2015-11-22 NOTE — MAU Provider Note (Signed)
History     CSN: [id number]  Arrival date and time: 11/22/15 [id number]   First Provider Initiated Contact with Patient 11/22/15 [phone]      Chief Complaint  Patient presents with  . vaginal irritation   HPI   [name] is a 20 y.o. female G0P0000 here with pelvic pain and vaginal irritation. Symptoms started last Friday however worsened in the last day. She can't get comfortable due to the irritation she feels in her vagina. She has one sexual partner, however they are not longer today. She denies a history of STI's. She denies fever.   OB History    Gravida Para Term Preterm AB Living   0 0 0 0 0 0   SAB TAB Ectopic Multiple Live Births   0 0 0 0 0      Past Medical History:  Diagnosis Date  . Arthritis   . Asthma     Past Surgical History:  Procedure Laterality Date  . ADENOIDECTOMY    . TONSILLECTOMY    . WISDOM TOOTH EXTRACTION      Family History  Problem Relation Age of Onset  . Diabetes Mother   . Hypertension Mother   . Hyperlipidemia Mother     Social History  Substance Use Topics  . Smoking status: Current Every Day Smoker    Packs/day: 0.25    Types: Cigarettes  . Smokeless tobacco: Never Used  . Alcohol use No    Allergies:  Allergies  Allergen Reactions  . Peanuts [Peanut Oil] Hives  . Penicillins Swelling    Has patient had a PCN reaction causing immediate rash, facial/tongue/throat swelling, SOB or lightheadedness with hypotension: Yes Has patient had a PCN reaction causing severe rash involving mucus membranes or skin necrosis: not sure Has patient had a PCN reaction that required hospitalization Yes Has patient had a PCN reaction occurring within the last 10 years: Yes If all of the above answers are "NO", then may proceed with Cephalosporin use.   . Sulfa Antibiotics Swelling  . Latex Rash    Prescriptions Prior to Admission  Medication Sig Dispense Refill Last Dose  . Multiple Vitamins-Minerals (MULTIVITAMIN PO) Take 1  tablet by mouth every morning.   Past Week at Unknown time  . albuterol (PROVENTIL HFA;VENTOLIN HFA) 108 (90 BASE) MCG/ACT inhaler Inhale 1-2 puffs into the lungs every 6 (six) hours as needed for wheezing or shortness of breath.   rescue  . diphenhydrAMINE (BENADRYL) 25 mg capsule Take 1 capsule (25 mg total) by mouth every 6 (six) hours as needed for itching. (Patient not taking: Reported on 11/22/2015) 30 capsule 0 Not Taking at Unknown time  . metroNIDAZOLE (FLAGYL) 500 MG tablet Take 1 tablet (500 mg total) by mouth 2 (two) times daily. (Patient not taking: Reported on 11/22/2015) 14 tablet 0 Not Taking at Unknown time  . predniSONE (DELTASONE) 50 MG tablet Take 1 tablet (50 mg total) by mouth daily. (Patient not taking: Reported on 11/22/2015) 5 tablet 0 Not Taking at Unknown time   Results for orders placed or performed during the hospital encounter of 11/22/15 (from the past 24 hour(s))  Urinalysis, Routine w reflex microscopic ([facility])     Status: Abnormal   Collection Time: 11/22/15  8:34 AM  Result Value Ref Range   Color, Urine YELLOW YELLOW   APPearance CLEAR CLEAR   Specific Gravity, Urine >1.030 (H) 1.005 - 1.030   pH 6.0 5.0 - 8.0   Glucose, UA NEGATIVE  NEGATIVE mg/dL   Hgb urine dipstick LARGE (A) NEGATIVE   Bilirubin Urine NEGATIVE NEGATIVE   Ketones, ur NEGATIVE NEGATIVE mg/dL   Protein, ur NEGATIVE NEGATIVE mg/dL   Nitrite NEGATIVE NEGATIVE   Leukocytes, UA MODERATE (A) NEGATIVE  Urine microscopic-add on     Status: Abnormal   Collection Time: 11/22/15  8:34 AM  Result Value Ref Range   Squamous Epithelial / LPF 0-5 (A) NONE SEEN   WBC, UA 6-30 0 - 5 WBC/hpf   RBC / HPF 0-5 0 - 5 RBC/hpf   Bacteria, UA FEW (A) NONE SEEN   Urine-Other TRICHOMONAS PRESENT   Pregnancy, urine POC     Status: None   Collection Time: 11/22/15  9:02 AM  Result Value Ref Range   Preg Test, Ur NEGATIVE NEGATIVE  Wet prep, genital     Status: Abnormal   Collection Time: 11/22/15  9:10 AM   Result Value Ref Range   Yeast Wet Prep HPF POC NONE SEEN NONE SEEN   Trich, Wet Prep PRESENT (A) NONE SEEN   Clue Cells Wet Prep HPF POC PRESENT (A) NONE SEEN   WBC, Wet Prep HPF POC MANY (A) NONE SEEN   Sperm NONE SEEN     Review of Systems  Constitutional: Negative for chills and fever.  Gastrointestinal: Positive for abdominal pain and nausea. Negative for vomiting.  Genitourinary: Negative for dysuria, frequency and urgency.   Physical Exam   Blood pressure 98/58, pulse 64, temperature 98.4 F (36.9 C), temperature source Oral, resp. rate 16, last menstrual period 11/20/2015.  Physical Exam  Constitutional: She is oriented to person, place, and time. She appears well-developed and well-nourished. No distress.  Respiratory: Effort normal.  GI: Soft. She exhibits no distension. There is no tenderness. There is no rebound.  Genitourinary:  Genitourinary Comments: Vagina - Small amount of pink, creamy vaginal discharge, no odor  Cervix -+ contact bleeding, no active bleeding  Bimanual exam: Cervix closed, + CMT  Adnexa non tender, no masses bilaterally GC/Chlam, wet prep done Chaperone present for exam.   Musculoskeletal: Normal range of motion.  Neurological: She is alert and oriented to person, place, and time.  Skin: Skin is warm. She is not diaphoretic.  Psychiatric: Her behavior is normal.    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   Assessment and Plan   A:  1. PID (acute pelvic inflammatory disease)   2. Trichomonas infection      P:  Discharge home in stable condition Rx: Moxifloxicin, Azithromycin to repeat in 1 week Partner needs to be notified Condoms always HIV, GC pending Return if symptoms worsen    Duane Lope, NP 11/22/2015 3:42 PM

## 2015-11-23 LAB — GC/CHLAMYDIA PROBE AMP (~~LOC~~) NOT AT ARMC
Chlamydia: NEGATIVE
Neisseria Gonorrhea: NEGATIVE

## 2015-11-23 LAB — HIV ANTIBODY (ROUTINE TESTING W REFLEX): HIV SCREEN 4TH GENERATION: NONREACTIVE

## 2015-11-24 ENCOUNTER — Other Ambulatory Visit: Payer: Self-pay | Admitting: Certified Nurse Midwife

## 2015-11-24 MED ORDER — LEVOFLOXACIN 500 MG PO TABS: 500.0000 mg | ORAL_TABLET | Freq: Every day | ORAL | 0 refills | Status: AC

## 2015-11-24 MED ORDER — LEVOFLOXACIN 500 MG PO TABS: 500.0000 mg | ORAL_TABLET | Freq: Every day | ORAL | 0 refills | Status: DC

## 2015-11-24 MED FILL — levoFLOXacin 500 MG TABS: 500 | 14 days supply | Qty: 14 | Fill #0

## 2015-11-24 NOTE — Progress Notes (Signed)
Pt seen 2 days ago in MAU for PID. She returns today requesting a less expensive alternative to Moxifloxacin since it's not covered by insurance. Rx for Levofloxacin given to pt.

## 2015-12-28 ENCOUNTER — Encounter (HOSPITAL_COMMUNITY): Payer: Self-pay | Admitting: *Deleted

## 2015-12-28 ENCOUNTER — Inpatient Hospital Stay (HOSPITAL_COMMUNITY)
Admission: AD | Admit: 2015-12-28 | Discharge: 2015-12-29 | Disposition: A | Discharge status: Home / Self Care | Payer: Medicare Other | Source: Ambulatory Visit | Attending: Obstetrics & Gynecology | Admitting: Obstetrics & Gynecology

## 2015-12-28 DIAGNOSIS — B373 Candidiasis of vulva and vagina: Secondary | ICD-10-CM

## 2015-12-28 DIAGNOSIS — R102 Pelvic and perineal pain: Secondary | ICD-10-CM | POA: Insufficient documentation

## 2015-12-28 DIAGNOSIS — Z880 Allergy status to penicillin: Secondary | ICD-10-CM | POA: Insufficient documentation

## 2015-12-28 DIAGNOSIS — Z3202 Encounter for pregnancy test, result negative: Secondary | ICD-10-CM | POA: Insufficient documentation

## 2015-12-28 DIAGNOSIS — F17210 Nicotine dependence, cigarettes, uncomplicated: Secondary | ICD-10-CM | POA: Insufficient documentation

## 2015-12-28 DIAGNOSIS — B3731 Acute candidiasis of vulva and vagina: Secondary | ICD-10-CM

## 2015-12-28 LAB — POCT PREGNANCY, URINE: PREG TEST UR: NEGATIVE

## 2015-12-28 NOTE — MAU Note (Signed)
Pt reports her stomach has been hurting today. Denies nausea, vomiting, diarrhea or fever.

## 2015-12-29 ENCOUNTER — Encounter (HOSPITAL_COMMUNITY): Payer: Self-pay | Admitting: *Deleted

## 2015-12-29 DIAGNOSIS — B373 Candidiasis of vulva and vagina: Secondary | ICD-10-CM | POA: Diagnosis not present

## 2015-12-29 LAB — URINE MICROSCOPIC-ADD ON

## 2015-12-29 LAB — URINALYSIS, ROUTINE W REFLEX MICROSCOPIC
BILIRUBIN URINE: NEGATIVE
Glucose, UA: NEGATIVE mg/dL
Hgb urine dipstick: NEGATIVE
Ketones, ur: NEGATIVE mg/dL
NITRITE: NEGATIVE
PH: 6.5 (ref 5.0–8.0)
Protein, ur: NEGATIVE mg/dL
SPECIFIC GRAVITY, URINE: 1.025 (ref 1.005–1.030)

## 2015-12-29 LAB — GC/CHLAMYDIA PROBE AMP (~~LOC~~) NOT AT ARMC
CHLAMYDIA, DNA PROBE: NEGATIVE
Neisseria Gonorrhea: NEGATIVE

## 2015-12-29 LAB — WET PREP, GENITAL
SPERM: NONE SEEN
TRICH WET PREP: NONE SEEN

## 2015-12-29 MED ORDER — FLUCONAZOLE 150 MG PO TABS: 150.0000 mg | ORAL_TABLET | Freq: Once | ORAL | 0 refills | Status: AC

## 2015-12-29 NOTE — MAU Provider Note (Signed)
History     CSN: [id number]  Arrival date and time: 12/28/15 2338   First Provider Initiated Contact with Patient 12/29/15 0024      Chief Complaint  Patient presents with  . Pelvic Pain  . Vaginal Discharge   Pelvic Pain  The patient's primary symptoms include pelvic pain and vaginal discharge. This is a new problem. The current episode started in the past 7 days. The problem occurs constantly. The problem has been gradually worsening. Pain severity now: 7/10. The problem affects both sides. She is not pregnant. Associated symptoms include abdominal pain. Pertinent negatives include no chills, constipation, diarrhea, dysuria, fever, frequency, nausea, urgency or vomiting. The vaginal discharge was white and malodorous. There has been no bleeding. Nothing aggravates the symptoms. She has tried nothing for the symptoms. She is sexually active. She uses nothing for contraception. Her menstrual history has been irregular (LMP sometime in september ).   Past Medical History:  Diagnosis Date  . Arthritis   . Asthma     Past Surgical History:  Procedure Laterality Date  . ADENOIDECTOMY    . TONSILLECTOMY    . WISDOM TOOTH EXTRACTION      Family History  Problem Relation Age of Onset  . Diabetes Mother   . Hypertension Mother   . Hyperlipidemia Mother     Social History  Substance Use Topics  . Smoking status: Current Every Day Smoker    Packs/day: 0.25    Types: Cigarettes  . Smokeless tobacco: Never Used  . Alcohol use No    Allergies:  Allergies  Allergen Reactions  . Peanuts [Peanut Oil] Hives  . Penicillins Swelling    Has patient had a PCN reaction causing immediate rash, facial/tongue/throat swelling, SOB or lightheadedness with hypotension: Yes Has patient had a PCN reaction causing severe rash involving mucus membranes or skin necrosis: not sure Has patient had a PCN reaction that required hospitalization Yes Has patient had a PCN reaction occurring within the  last 10 years: Yes If all of the above answers are "NO", then may proceed with Cephalosporin use.   . Sulfa Antibiotics Swelling  . Latex Rash    Prescriptions Prior to Admission  Medication Sig Dispense Refill Last Dose  . albuterol (PROVENTIL HFA;VENTOLIN HFA) 108 (90 BASE) MCG/ACT inhaler Inhale 1-2 puffs into the lungs every 6 (six) hours as needed for wheezing or shortness of breath.   rescue  . Multiple Vitamins-Minerals (MULTIVITAMIN PO) Take 1 tablet by mouth every morning.   Past Week at Unknown time    Review of Systems  Constitutional: Negative for chills and fever.  Gastrointestinal: Positive for abdominal pain. Negative for constipation, diarrhea, nausea and vomiting.  Genitourinary: Positive for pelvic pain and vaginal discharge. Negative for dysuria, frequency and urgency.   Physical Exam   Blood pressure (!) 99/52, pulse 70, temperature 99 F (37.2 C), temperature source Oral, resp. rate 17, height 5\' 6"  (1.676 m), weight 145 lb (65.8 kg), last menstrual period 11/10/2015, SpO2 100 %.  Physical Exam  Nursing note and vitals reviewed. Constitutional: She is oriented to person, place, and time. She appears well-developed and well-nourished. No distress.  HENT:  Head: Normocephalic.  Cardiovascular: Normal rate.   Respiratory: Effort normal.  GI: Soft. There is no tenderness. There is no rebound.  Genitourinary:  Genitourinary Comments:  External: no lesion Vagina: large amount of thick, white discharge Cervix: pink, smooth Uterus: Patient refuses bimanual exam.    Neurological: She is alert and oriented to person,  place, and time.  Skin: Skin is warm and dry.  Psychiatric: She has a normal mood and affect.    Results for orders placed or performed during the hospital encounter of 12/28/15 (from the past 24 hour(s))  Urinalysis, Routine w reflex microscopic ([facility])     Status: Abnormal   Collection Time: 12/28/15 11:50 PM  Result Value Ref Range    Color, Urine YELLOW YELLOW   APPearance CLEAR CLEAR   Specific Gravity, Urine 1.025 1.005 - 1.030   pH 6.5 5.0 - 8.0   Glucose, UA NEGATIVE NEGATIVE mg/dL   Hgb urine dipstick NEGATIVE NEGATIVE   Bilirubin Urine NEGATIVE NEGATIVE   Ketones, ur NEGATIVE NEGATIVE mg/dL   Protein, ur NEGATIVE NEGATIVE mg/dL   Nitrite NEGATIVE NEGATIVE   Leukocytes, UA SMALL (A) NEGATIVE  Urine microscopic-add on     Status: Abnormal   Collection Time: 12/28/15 11:50 PM  Result Value Ref Range   Squamous Epithelial / LPF 6-30 (A) NONE SEEN   WBC, UA 0-5 0 - 5 WBC/hpf   RBC / HPF 0-5 0 - 5 RBC/hpf   Bacteria, UA FEW (A) NONE SEEN   Crystals CA OXALATE CRYSTALS (A) NEGATIVE   Urine-Other MUCOUS PRESENT   Pregnancy, urine POC     Status: None   Collection Time: 12/29/15 12:00 AM  Result Value Ref Range   Preg Test, Ur NEGATIVE NEGATIVE  Wet prep, genital     Status: Abnormal   Collection Time: 12/29/15 12:20 AM  Result Value Ref Range   Yeast Wet Prep HPF POC PRESENT (A) NONE SEEN   Trich, Wet Prep NONE SEEN NONE SEEN   Clue Cells Wet Prep HPF POC PRESENT (A) NONE SEEN   WBC, Wet Prep HPF POC FEW (A) NONE SEEN   Sperm NONE SEEN     MAU Course  Procedures  MDM   Assessment and Plan   1. Yeast infection involving the vagina and surrounding area    DC home Comfort measures reviewed  RX: diflucan as directed #2  Return to MAU as needed  Follow-up Information    [facility] .   Contact information: [address] [place] [id number] [phone]           Tawnya CrookHogan, Kikue Gerhart Donovan 12/29/2015, 12:25 AM

## 2015-12-29 NOTE — Discharge Instructions (Signed)
Monilial Vaginitis °Vaginitis in a soreness, swelling and redness (inflammation) of the vagina and vulva. Monilial vaginitis is not a sexually transmitted infection. °CAUSES  °Yeast vaginitis is caused by yeast (candida) that is normally found in your vagina. With a yeast infection, the candida has overgrown in number to a point that upsets the chemical balance. °SYMPTOMS  °· White, thick vaginal discharge. °· Swelling, itching, redness and irritation of the vagina and possibly the lips of the vagina (vulva). °· Burning or painful urination. °· Painful intercourse. °DIAGNOSIS  °Things that may contribute to monilial vaginitis are: °· Postmenopausal and virginal states. °· Pregnancy. °· Infections. °· Being tired, sick or stressed, especially if you had monilial vaginitis in the past. °· Diabetes. Good control will help lower the chance. °· Birth control pills. °· Tight fitting garments. °· Using bubble bath, feminine sprays, douches or deodorant tampons. °· Taking certain medications that kill germs (antibiotics). °· Sporadic recurrence can occur if you become ill. °TREATMENT  °Your caregiver will give you medication. °· There are several kinds of anti monilial vaginal creams and suppositories specific for monilial vaginitis. For recurrent yeast infections, use a suppository or cream in the vagina 2 times a week, or as directed. °· Anti-monilial or steroid cream for the itching or irritation of the vulva may also be used. Get your caregiver's permission. °· Painting the vagina with methylene blue solution may help if the monilial cream does not work. °· Eating yogurt may help prevent monilial vaginitis. °HOME CARE INSTRUCTIONS  °· Finish all medication as prescribed. °· Do not have sex until treatment is completed or after your caregiver tells you it is okay. °· Take warm sitz baths. °· Do not douche. °· Do not use tampons, especially scented ones. °· Wear cotton underwear. °· Avoid tight pants and panty  hose. °· Tell your sexual partner that you have a yeast infection. They should go to their caregiver if they have symptoms such as mild rash or itching. °· Your sexual partner should be treated as well if your infection is difficult to eliminate. °· Practice safer sex. Use condoms. °· Some vaginal medications cause latex condoms to fail. Vaginal medications that harm condoms are: °· Cleocin cream. °· Butoconazole (Femstat®). °· Terconazole (Terazol®) vaginal suppository. °· Miconazole (Monistat®) (may be purchased over the counter). °SEEK MEDICAL CARE IF:  °· You have a temperature by mouth above 102° F (38.9° C). °· The infection is getting worse after 2 days of treatment. °· The infection is not getting better after 3 days of treatment. °· You develop blisters in or around your vagina. °· You develop vaginal bleeding, and it is not your menstrual period. °· You have pain when you urinate. °· You develop intestinal problems. °· You have pain with sexual intercourse. °  °This information is not intended to replace advice given to you by your health care provider. Make sure you discuss any questions you have with your health care provider. °  °Document Released: 11/21/2004 Document Revised: 05/06/2011 Document Reviewed: 08/15/2014 °Elsevier Interactive Patient Education ©2016 Elsevier Inc. ° °Probiotics °WHAT ARE PROBIOTICS? °Probiotics are the good bacteria and yeasts that live in your body and keep you and your digestive system healthy. Probiotics also help your body's defense (immune) system and protect your body against bad bacterial growth.  °Certain foods contain probiotics, such as yogurt. Probiotics can also be purchased as a supplement. As with any supplement or drug, it is important to discuss its use with your health care   provider.  °WHAT AFFECTS THE BALANCE OF BACTERIA IN MY BODY? °The balance of bacteria in your body can be affected by:  °· Antibiotic medicines. Antibiotics are sometimes necessary to  treat infection. Unfortunately, they may kill good or friendly bacteria in your body as well as the bad bacteria. This may lead to stomach problems like diarrhea, gas, and cramping. °· Disease. Some conditions are the result of an overgrowth of bad bacteria, yeasts, parasites, or fungi. These conditions include:   °¨ Infectious diarrhea. °¨ Stomach and respiratory infections. °¨ Skin infections. °¨ Irritable bowel syndrome (IBS). °¨ Inflammatory bowel diseases. °¨ Ulcer due to Helicobacter pylori (H. pylori) infection. °¨ Tooth decay and periodontal disease. °¨ Vaginal infections. °Stress and poor diet may also lower the good bacteria in your body.  °WHAT TYPE OF PROBIOTIC IS RIGHT FOR ME? °Probiotics are available over the counter at your local pharmacy, health food, or grocery store. They come in many different forms, combinations of strains, and dosing strengths. Some may need to be refrigerated. Always read the label for storage and usage instructions. °Specific strains have been shown to be more effective for certain conditions. Ask your health care provider what option is best for you.  °WHY WOULD I NEED PROBIOTICS? °There are many reasons your health care provider might recommend a probiotic supplement, including:  °· Diarrhea. °· Constipation. °· IBS. °· Respiratory infections. °· Yeast infections. °· Acne, eczema, and other skin conditions. °· Frequent urinary tract infections (UTIs). °ARE THERE SIDE EFFECTS OF PROBIOTICS? °Some people experience mild side effects when taking probiotics. Side effects are usually temporary and may include:  °· Gas. °· Bloating. °· Cramping. °Rarely, serious side effects, such as infection or immune system changes, may occur. °WHAT ELSE DO I NEED TO KNOW ABOUT PROBIOTICS?  °· There are many different strains of probiotics. Certain strains may be more effective depending on your condition. Probiotics are available in varying doses. Ask your health care provider which probiotic  you should use and how often.   °· If you are taking probiotics along with antibiotics, it is generally recommended to wait at least 2 hours between taking the antibiotic and taking the probiotic.   °FOR MORE INFORMATION:  °National Center for Complementary and Alternative Medicine http://nccam.nih.gov/ °  °This information is not intended to replace advice given to you by your health care provider. Make sure you discuss any questions you have with your health care provider. °  °Document Released: 09/08/2013 Document Reviewed: 09/08/2013 °Elsevier Interactive Patient Education ©2016 Elsevier Inc. ° °

## 2015-12-31 ENCOUNTER — Emergency Department (HOSPITAL_COMMUNITY): Payer: Medicaid Other

## 2015-12-31 ENCOUNTER — Encounter (HOSPITAL_COMMUNITY): Payer: Self-pay

## 2015-12-31 ENCOUNTER — Emergency Department (HOSPITAL_COMMUNITY)
Admission: EM | Admit: 2015-12-31 | Discharge: 2015-12-31 | Disposition: A | Discharge status: Home / Self Care | Payer: Medicaid Other | Attending: Emergency Medicine | Admitting: Emergency Medicine

## 2015-12-31 DIAGNOSIS — F1210 Cannabis abuse, uncomplicated: Secondary | ICD-10-CM

## 2015-12-31 DIAGNOSIS — R109 Unspecified abdominal pain: Secondary | ICD-10-CM

## 2015-12-31 DIAGNOSIS — R1084 Generalized abdominal pain: Secondary | ICD-10-CM

## 2015-12-31 DIAGNOSIS — Z91010 Allergy to peanuts: Secondary | ICD-10-CM | POA: Insufficient documentation

## 2015-12-31 DIAGNOSIS — Z91040 Latex allergy status: Secondary | ICD-10-CM | POA: Insufficient documentation

## 2015-12-31 DIAGNOSIS — J45909 Unspecified asthma, uncomplicated: Secondary | ICD-10-CM | POA: Insufficient documentation

## 2015-12-31 DIAGNOSIS — F17210 Nicotine dependence, cigarettes, uncomplicated: Secondary | ICD-10-CM | POA: Insufficient documentation

## 2015-12-31 LAB — URINALYSIS, ROUTINE W REFLEX MICROSCOPIC
BILIRUBIN URINE: NEGATIVE
Glucose, UA: NEGATIVE mg/dL
HGB URINE DIPSTICK: NEGATIVE
Ketones, ur: NEGATIVE mg/dL
Leukocytes, UA: NEGATIVE
Nitrite: NEGATIVE
PROTEIN: NEGATIVE mg/dL
Specific Gravity, Urine: 1.045 — ABNORMAL HIGH (ref 1.005–1.030)
pH: 7 (ref 5.0–8.0)

## 2015-12-31 LAB — COMPREHENSIVE METABOLIC PANEL
ALK PHOS: 58 U/L (ref 38–126)
ALT: 17 U/L (ref 14–54)
AST: 21 U/L (ref 15–41)
Albumin: 3.9 g/dL (ref 3.5–5.0)
Anion gap: 4 — ABNORMAL LOW (ref 5–15)
BILIRUBIN TOTAL: 0.8 mg/dL (ref 0.3–1.2)
BUN: 8 mg/dL (ref 6–20)
CALCIUM: 8.9 mg/dL (ref 8.9–10.3)
CO2: 23 mmol/L (ref 22–32)
CREATININE: 0.95 mg/dL (ref 0.44–1.00)
Chloride: 109 mmol/L (ref 101–111)
GFR calc Af Amer: >60 mL/min (ref >60)
Glucose, Bld: 88 mg/dL (ref 65–99)
Potassium: 4.2 mmol/L (ref 3.5–5.1)
Sodium: 136 mmol/L (ref 135–145)
TOTAL PROTEIN: 6.7 g/dL (ref 6.5–8.1)

## 2015-12-31 LAB — CBC WITH DIFFERENTIAL/PLATELET
BASOS ABS: 0 10*3/uL (ref 0.0–0.1)
Basophils Relative: 0 %
Eosinophils Absolute: 0.2 10*3/uL (ref 0.0–0.7)
Eosinophils Relative: 3 %
HEMATOCRIT: 37.3 % (ref 36.0–46.0)
Hemoglobin: 12.9 g/dL (ref 12.0–15.0)
LYMPHS PCT: 38 %
Lymphs Abs: 2.5 10*3/uL (ref 0.7–4.0)
MCH: 31.3 pg (ref 26.0–34.0)
MCHC: 34.6 g/dL (ref 30.0–36.0)
MCV: 90.5 fL (ref 78.0–100.0)
MONO ABS: 0.6 10*3/uL (ref 0.1–1.0)
Monocytes Relative: 10 %
NEUTROS ABS: 3.3 10*3/uL (ref 1.7–7.7)
Neutrophils Relative %: 49 %
Platelets: 184 10*3/uL (ref 150–400)
RBC: 4.12 MIL/uL (ref 3.87–5.11)
RDW: 13 % (ref 11.5–15.5)
WBC: 6.6 10*3/uL (ref 4.0–10.5)

## 2015-12-31 LAB — I-STAT BETA HCG BLOOD, ED (MC, WL, AP ONLY)

## 2015-12-31 MED ORDER — SODIUM CHLORIDE 0.9 % IV BOLUS (SEPSIS)
1000.0000 mL | Freq: Once | INTRAVENOUS | Status: AC
  Administered 2015-12-31: 1000 mL via INTRAVENOUS

## 2015-12-31 MED ORDER — MORPHINE SULFATE (PF) 2 MG/ML IV SOLN
2.0000 mg | Freq: Once | INTRAVENOUS | Status: AC
  Administered 2015-12-31: 2 mg via INTRAVENOUS
  Filled 2015-12-31: qty 1

## 2015-12-31 MED ORDER — ONDANSETRON 8 MG PO TBDP: ORAL_TABLET | ORAL | 0 refills | Status: DC

## 2015-12-31 MED ORDER — IOPAMIDOL (ISOVUE-300) INJECTION 61%
100.0000 mL | Freq: Once | INTRAVENOUS | Status: AC | PRN
  Administered 2015-12-31: 100 mL via INTRAVENOUS

## 2015-12-31 MED ORDER — MORPHINE SULFATE (PF) 2 MG/ML IV SOLN: 4.0000 mg | Freq: Once | INTRAVENOUS | Status: DC

## 2015-12-31 MED ORDER — MORPHINE SULFATE (PF) 2 MG/ML IV SOLN
4.0000 mg | Freq: Once | INTRAVENOUS | Status: AC
Start: 2015-12-31 — End: 2015-12-31
  Administered 2015-12-31: 4 mg via INTRAVENOUS
  Filled 2015-12-31: qty 2

## 2015-12-31 NOTE — ED Notes (Signed)
Bed: [id number] Expected date:  Expected time:  Means of arrival:  Comments: Lawerance CruelStarr

## 2015-12-31 NOTE — ED Notes (Signed)
Informed ER provider that pt complains of abdominal pain, legs being numb, being hungry, abdominal bloating.  No respiratory or acute distress noted alert and oriented x 3 call light in reach no reaction to medication noted.

## 2015-12-31 NOTE — ED Notes (Signed)
No respiratory or acute distress noted alert and oriented x 3 call light in reach. 

## 2015-12-31 NOTE — Discharge Instructions (Addendum)
1. Medications: zofran, usual home medications °2. Treatment: rest, drink plenty of fluids, advance diet slowly °3. Follow Up: Please followup with your primary doctor in 2 days for discussion of your diagnoses and further evaluation after today's visit; if you do not have a primary care doctor use the resource guide provided to find one; Please return to the ER for persistent vomiting, high fevers or worsening symptoms ° °

## 2015-12-31 NOTE — ED Triage Notes (Signed)
Right sided abdominal pain for 3-4 days [facility]

## 2015-12-31 NOTE — ED Provider Notes (Signed)
[facility]-EMERGENCY DEPT Provider Note   CSN: [id number] Arrival date & time: 12/31/15  0207     History   Chief Complaint Chief Complaint  Patient presents with  . Abdominal Pain    HPI Misty Sanders is a 20 y.o. female with a hx of asthma, arthritis presents to the Emergency Department complaining of gradual, persistent, progressively worsening right mid-low sided abdominal pain onset 3-4 days ago.  Pt was at the MAU on 12/29/15 and she was treated for a vaginal yeast infection.  She reports no relief with the treatment.  Pt reports she is anxious and vomiting x1.  Pt without melena, hematochezia, fever, chills.  No migration or radiation.  Pt vomited x1 around 8pm - stomach contents NBNB.  She reports no ASA or antiflammatory medications.  No vaginal or UA symptoms.   Nothing makes it better and nothing makes it worse.  Pt denies fever, chills, diarrhea .    The history is provided by the patient and medical records. No language interpreter was used.    Past Medical History:  Diagnosis Date  . Arthritis   . Asthma     There are no active problems to display for this patient.   Past Surgical History:  Procedure Laterality Date  . ADENOIDECTOMY    . TONSILLECTOMY    . WISDOM TOOTH EXTRACTION      OB History    Gravida Para Term Preterm AB Living   0 0 0 0 0 0   SAB TAB Ectopic Multiple Live Births   0 0 0 0 0       Home Medications    Prior to Admission medications   Medication Sig Start Date End Date Taking? Authorizing Provider  albuterol (PROVENTIL HFA;VENTOLIN HFA) 108 (90 BASE) MCG/ACT inhaler Inhale 1-2 puffs into the lungs every 6 (six) hours as needed for wheezing or shortness of breath.   Yes Historical Provider, MD  Multiple Vitamins-Minerals (MULTIVITAMIN PO) Take 1 tablet by mouth every morning.   Yes Historical Provider, MD  ondansetron (ZOFRAN ODT) 8 MG disintegrating tablet 8mg  ODT q4 hours prn nausea 12/31/15   Dierdre Forth, PA-C    Family  History Family History  Problem Relation Age of Onset  . Diabetes Mother   . Hypertension Mother   . Hyperlipidemia Mother     Social History Social History  Substance Use Topics  . Smoking status: Current Every Day Smoker    Packs/day: 0.25    Types: Cigarettes  . Smokeless tobacco: Never Used  . Alcohol use No     Allergies   Peanuts [peanut oil]; Penicillins; Sulfa antibiotics; and Latex   Review of Systems Review of Systems  Gastrointestinal: Positive for abdominal pain, nausea and vomiting (x1 today).  All other systems reviewed and are negative.    Physical Exam Updated Vital Signs BP 104/60   Pulse 71   Temp 97.8 F (36.6 C) (Oral)   Resp 18   LMP 11/10/2015   Physical Exam  Constitutional: She appears well-developed and well-nourished. No distress.  Awake, alert, nontoxic appearance  HENT:  Head: Normocephalic and atraumatic.  Mouth/Throat: Oropharynx is clear and moist. No oropharyngeal exudate.  Eyes: Conjunctivae are normal. No scleral icterus.  Neck: Normal range of motion. Neck supple.  Cardiovascular: Normal rate, regular rhythm and intact distal pulses.   Pulmonary/Chest: Effort normal and breath sounds normal. No respiratory distress. She has no wheezes.  Equal chest expansion  Abdominal: Soft. Bowel sounds are normal. She exhibits  no mass. There is no tenderness. There is no rebound and no guarding.  Musculoskeletal: Normal range of motion. She exhibits no edema.  Neurological: She is alert.  Speech is clear and goal oriented Moves extremities without ataxia  Skin: Skin is warm and dry. She is not diaphoretic.  Psychiatric: She has a normal mood and affect.  Nursing note and vitals reviewed.    ED Treatments / Results  Labs (all labs ordered are listed, but only abnormal results are displayed) Labs Reviewed  COMPREHENSIVE METABOLIC PANEL - Abnormal; Notable for the following:       Result Value   Anion gap 4 (*)    All other  components within normal limits  CBC WITH DIFFERENTIAL/PLATELET  URINALYSIS, ROUTINE W REFLEX MICROSCOPIC (NOT AT [facility])  I-STAT BETA HCG BLOOD, ED ([facility], [facility], [facility] ONLY)    Radiology Ct Abdomen Pelvis W Contrast  Result Date: 12/31/2015 CLINICAL DATA:  Right lower quadrant pain for 4 days. EXAM: CT ABDOMEN AND PELVIS WITH CONTRAST TECHNIQUE: Multidetector CT imaging of the abdomen and pelvis was performed using the standard protocol following bolus administration of intravenous contrast. CONTRAST:  100mL ISOVUE-300 IOPAMIDOL (ISOVUE-300) INJECTION 61% COMPARISON:  None. FINDINGS: Lower chest: No acute abnormality. Hepatobiliary: No focal liver abnormality is seen. No gallstones, gallbladder wall thickening, or biliary dilatation. Pancreas: Unremarkable. No pancreatic ductal dilatation or surrounding inflammatory changes. Spleen: Normal in size without focal abnormality. Adrenals/Urinary Tract: Adrenal glands are unremarkable. Kidneys are normal, without renal calculi, focal lesion, or hydronephrosis. Bladder is unremarkable. Stomach/Bowel: The appendix is normal. Stomach, small bowel and colon are normal. Vascular/Lymphatic: No significant vascular findings are present. No enlarged abdominal or pelvic lymph nodes. Reproductive: Uterus and bilateral adnexa are unremarkable. Other: No focal inflammatory changes are evident in the abdomen or pelvis. There is scant volume free pelvic fluid. Musculoskeletal: No acute or significant osseous findings. IMPRESSION: Normal appendix.  No acute findings. Electronically Signed   By: Ellery Plunkaniel R Mitchell M.D.   On: 12/31/2015 06:15    Procedures Procedures (including critical care time)  Medications Ordered in ED Medications  sodium chloride 0.9 % bolus 1,000 mL (0 mLs Intravenous Stopped 12/31/15 0556)  morphine 2 MG/ML injection 4 mg (4 mg Intravenous Given 12/31/15 0500)  iopamidol (ISOVUE-300) 61 % injection 100 mL (100 mLs Intravenous Contrast Given 12/31/15 0551)    morphine 2 MG/ML injection 2 mg (2 mg Intravenous Given 12/31/15 0519)     Initial Impression / Assessment and Plan / ED Course  I have reviewed the triage vital signs and the nursing notes.  Pertinent labs & imaging results that were available during my care of the patient were reviewed by me and considered in my medical decision making (see chart for details).  Clinical Course     Patient is nontoxic, nonseptic appearing, in no apparent distress.  Patient's pain and other symptoms adequately managed in emergency department.  Fluid bolus given.  Labs, imaging and vitals reviewed.  Patient does not meet the SIRS or Sepsis criteria.  On repeat exam patient does not have a surgical abdomin and there are no peritoneal signs.  No indication of appendicitis, bowel obstruction, bowel perforation, cholecystitis, diverticulitis, PID or ectopic pregnancy.  I suspect that patient's pain is secondary to her chronic marijuana usage. Long discussion with her about this. She states this is not true and is unwilling to hear the possibility of such. Patient discharged home with symptomatic treatment and given strict instructions for follow-up with their primary care physician.  I have also discussed reasons to return immediately to the ER.  Patient expresses understanding and agrees with plan.     Final Clinical Impressions(s) / ED Diagnoses   Final diagnoses:  Abdominal cramping  Marijuana abuse  Generalized abdominal pain    New Prescriptions New Prescriptions   ONDANSETRON (ZOFRAN ODT) 8 MG DISINTEGRATING TABLET    8mg  ODT q4 hours prn nausea     Dierdre ForthHannah Alixandra Alfieri, PA-C 12/31/15 [id number]    April Palumbo, MD 12/31/15 [phone]

## 2016-01-03 ENCOUNTER — Inpatient Hospital Stay (HOSPITAL_COMMUNITY)
Admission: AD | Admit: 2016-01-03 | Discharge: 2016-01-04 | Disposition: A | Discharge status: Home / Self Care | Payer: Medicare Other | Source: Ambulatory Visit | Attending: Obstetrics and Gynecology | Admitting: Obstetrics and Gynecology

## 2016-01-03 DIAGNOSIS — Z3202 Encounter for pregnancy test, result negative: Secondary | ICD-10-CM | POA: Insufficient documentation

## 2016-01-03 DIAGNOSIS — F17210 Nicotine dependence, cigarettes, uncomplicated: Secondary | ICD-10-CM | POA: Insufficient documentation

## 2016-01-03 DIAGNOSIS — K5900 Constipation, unspecified: Secondary | ICD-10-CM | POA: Insufficient documentation

## 2016-01-03 DIAGNOSIS — Z880 Allergy status to penicillin: Secondary | ICD-10-CM | POA: Insufficient documentation

## 2016-01-03 DIAGNOSIS — R1013 Epigastric pain: Secondary | ICD-10-CM

## 2016-01-03 LAB — POCT PREGNANCY, URINE: Preg Test, Ur: NEGATIVE

## 2016-01-03 NOTE — MAU Note (Addendum)
Pt c/o rib pain and mid abdominal pain that started about 1 week ago. Denies vag bleeding or discharge. LMP: irregular-pt on depo. Denies n/v/d or constipation. Last BM was today. [facility] and was given nausea medication-has not needed it. Had CT scan-everything was normal. Took oxycodone that someone had given her [facility], but none today. Has not tried to take any other medications such as Tylenol or Ibuprofen since.

## 2016-01-04 ENCOUNTER — Encounter (HOSPITAL_COMMUNITY): Payer: Self-pay | Admitting: *Deleted

## 2016-01-04 DIAGNOSIS — R1013 Epigastric pain: Secondary | ICD-10-CM | POA: Diagnosis not present

## 2016-01-04 LAB — URINALYSIS, ROUTINE W REFLEX MICROSCOPIC
Bilirubin Urine: NEGATIVE
Glucose, UA: NEGATIVE mg/dL
HGB URINE DIPSTICK: NEGATIVE
Ketones, ur: NEGATIVE mg/dL
LEUKOCYTES UA: NEGATIVE
NITRITE: NEGATIVE
PROTEIN: NEGATIVE mg/dL
Specific Gravity, Urine: 1.015 (ref 1.005–1.030)
pH: 7 (ref 5.0–8.0)

## 2016-01-04 LAB — CBC WITH DIFFERENTIAL/PLATELET
BASOS ABS: 0 10*3/uL (ref 0.0–0.1)
Basophils Relative: 0 %
Eosinophils Absolute: 0.4 10*3/uL (ref 0.0–0.7)
Eosinophils Relative: 5 %
HCT: 34.8 % — ABNORMAL LOW (ref 36.0–46.0)
Hemoglobin: 12 g/dL (ref 12.0–15.0)
LYMPHS PCT: 48 %
Lymphs Abs: 4 10*3/uL (ref 0.7–4.0)
MCH: 31.3 pg (ref 26.0–34.0)
MCHC: 34.5 g/dL (ref 30.0–36.0)
MCV: 90.6 fL (ref 78.0–100.0)
Monocytes Absolute: 0.4 10*3/uL (ref 0.1–1.0)
Monocytes Relative: 5 %
NEUTROS ABS: 3.4 10*3/uL (ref 1.7–7.7)
Neutrophils Relative %: 42 %
PLATELETS: 202 10*3/uL (ref 150–400)
RBC: 3.84 MIL/uL — AB (ref 3.87–5.11)
RDW: 13.1 % (ref 11.5–15.5)
WBC: 8.1 10*3/uL (ref 4.0–10.5)

## 2016-01-04 LAB — COMPREHENSIVE METABOLIC PANEL
ALT: 14 U/L (ref 14–54)
ANION GAP: 4 — AB (ref 5–15)
AST: 18 U/L (ref 15–41)
Albumin: 3.4 g/dL — ABNORMAL LOW (ref 3.5–5.0)
Alkaline Phosphatase: 54 U/L (ref 38–126)
BILIRUBIN TOTAL: 0.6 mg/dL (ref 0.3–1.2)
BUN: 9 mg/dL (ref 6–20)
CHLORIDE: 107 mmol/L (ref 101–111)
CO2: 28 mmol/L (ref 22–32)
Calcium: 9 mg/dL (ref 8.9–10.3)
Creatinine, Ser: 1 mg/dL (ref 0.44–1.00)
GFR calc Af Amer: >60 mL/min (ref >60)
Glucose, Bld: 89 mg/dL (ref 65–99)
POTASSIUM: 4 mmol/L (ref 3.5–5.1)
Sodium: 139 mmol/L (ref 135–145)
TOTAL PROTEIN: 6.3 g/dL — AB (ref 6.5–8.1)

## 2016-01-04 LAB — LIPASE, BLOOD: LIPASE: 21 U/L (ref 11–51)

## 2016-01-04 LAB — AMYLASE: AMYLASE: 78 U/L (ref 28–100)

## 2016-01-04 MED ORDER — GI COCKTAIL ~~LOC~~
30.0000 mL | Freq: Once | ORAL | Status: AC
  Administered 2016-01-04: 30 mL via ORAL
  Filled 2016-01-04: qty 30

## 2016-01-04 MED ORDER — KETOROLAC TROMETHAMINE 10 MG PO TABS: 10.0000 mg | ORAL_TABLET | Freq: Four times a day (QID) | ORAL | 0 refills | Status: DC | PRN

## 2016-01-04 MED ORDER — FAMOTIDINE 20 MG PO TABS: 20.0000 mg | ORAL_TABLET | Freq: Two times a day (BID) | ORAL | 1 refills | Status: DC

## 2016-01-04 MED ORDER — POLYETHYLENE GLYCOL 3350 17 GM/SCOOP PO POWD: 17.0000 g | Freq: Every day | ORAL | 2 refills | Status: DC | PRN

## 2016-01-04 MED ORDER — KETOROLAC TROMETHAMINE 60 MG/2ML IM SOLN
60.0000 mg | Freq: Once | INTRAMUSCULAR | Status: AC
  Administered 2016-01-04: 60 mg via INTRAMUSCULAR
  Filled 2016-01-04: qty 2

## 2016-01-04 NOTE — MAU Note (Signed)
Pt ate spaghetti and sausage biscuit today and reports her stomach has been bloated for the past four days.  She reports taking gas X  One week ago but not since. Says the jpain was up in her right rib earlier today and now in her mid abdomen just above her navel.

## 2016-01-04 NOTE — MAU Provider Note (Signed)
Chief Complaint: Abdominal Pain   First Provider Initiated Contact with Patient 01/04/16 0032     SUBJECTIVE HPI: Misty Sanders is a 20 y.o. female who presents to Maternity Admissions reporting upper abdominal pain 1 week. Was evaluated at [facility] ED 12/31/2015 for abdominal pain. Normal CT.  Location: Entire upper abdomen Quality: Soreness Severity: 7/10 on pain scale Duration: One week Context: None Timing: Constant Modifying factors: None. No relationship to eating or defecation. Hasn't tried anything for the pain. Was prescribed nausea medicine last week ADD. Did not take it because she does not think she needs it. Associated signs and symptoms: Positive for vomiting 11 week ago. Negative for fever, chills, nausea, diarrhea, constipation. However, upon further questioning she does report some straining with and difficulty passing some bowel movements  Past Medical History:  Diagnosis Date  . Arthritis   . Asthma    OB History  Gravida Para Term Preterm AB Living  0 0 0 0 0 0  SAB TAB Ectopic Multiple Live Births  0 0 0 0 0       Past Surgical History:  Procedure Laterality Date  . ADENOIDECTOMY    . TONSILLECTOMY    . WISDOM TOOTH EXTRACTION     Social History   Social History  . Marital status: Single    Spouse name: N/A  . Number of children: N/A  . Years of education: N/A   Occupational History  . Not on file.   Social History Main Topics  . Smoking status: Current Every Day Smoker    Packs/day: 0.25    Types: Cigarettes  . Smokeless tobacco: Never Used  . Alcohol use No  . Drug use:     Types: Marijuana     Comment: Nov 5th   . Sexual activity: Yes    Birth control/ protection: None     Comment: last sex 23 Dec 2015   Other Topics Concern  . Not on file   Social History Narrative  . No narrative on file   Family History  Problem Relation Age of Onset  . Diabetes Mother   . Hypertension Mother   . Hyperlipidemia Mother    No  current facility-administered medications on file prior to encounter.    Current Outpatient Prescriptions on File Prior to Encounter  Medication Sig Dispense Refill  . albuterol (PROVENTIL HFA;VENTOLIN HFA) 108 (90 BASE) MCG/ACT inhaler Inhale 1-2 puffs into the lungs every 6 (six) hours as needed for wheezing or shortness of breath.    . Multiple Vitamins-Minerals (MULTIVITAMIN PO) Take 1 tablet by mouth every morning.    . ondansetron (ZOFRAN ODT) 8 MG disintegrating tablet 8mg  ODT q4 hours prn nausea 4 tablet 0   Allergies  Allergen Reactions  . Peanuts [Peanut Oil] Hives  . Penicillins Swelling    Has patient had a PCN reaction causing immediate rash, facial/tongue/throat swelling, SOB or lightheadedness with hypotension: Yes Has patient had a PCN reaction causing severe rash involving mucus membranes or skin necrosis: not sure Has patient had a PCN reaction that required hospitalization Yes Has patient had a PCN reaction occurring within the last 10 years: Yes If all of the above answers are "NO", then may proceed with Cephalosporin use.   . Sulfa Antibiotics Swelling  . Latex Rash    I have reviewed patient's Past Medical Hx, Surgical Hx, Family Hx, Social Hx, medications and allergies.   Review of Systems  Constitutional: Negative for appetite change, chills and fever.  Gastrointestinal: Positive for abdominal pain, constipation and vomiting (1). Negative for abdominal distention, blood in stool, diarrhea and nausea.  Genitourinary: Negative for dysuria, flank pain, vaginal bleeding and vaginal discharge.  Musculoskeletal: Negative for back pain.  Allergic/Immunologic: Negative for food allergies.    OBJECTIVE Patient Vitals for the past 24 hrs:  BP Temp Temp src Pulse Resp SpO2 Height Weight  01/03/16 2337 113/64 98.1 F (36.7 C) Oral 71 18 100 % 5\' 6"  (1.676 m) 149 lb (67.6 kg)   Constitutional: Well-developed, well-nourished female in no acute distress.   Cardiovascular: normal rate Respiratory: normal rate and effort.  GI: Abd soft, non-tender. Pos BS x 4 Neurologic: Alert and oriented x 4.  GU: Neg CVAT.  SPECULUM EXAM: Deferred  LAB RESULTS Results for orders placed or performed during the hospital encounter of 01/03/16 (from the past 24 hour(s))  Urinalysis, Routine w reflex microscopic (not at [facility])     Status: None   Collection Time: 01/03/16 11:35 PM  Result Value Ref Range   Color, Urine YELLOW YELLOW   APPearance CLEAR CLEAR   Specific Gravity, Urine 1.015 1.005 - 1.030   pH 7.0 5.0 - 8.0   Glucose, UA NEGATIVE NEGATIVE mg/dL   Hgb urine dipstick NEGATIVE NEGATIVE   Bilirubin Urine NEGATIVE NEGATIVE   Ketones, ur NEGATIVE NEGATIVE mg/dL   Protein, ur NEGATIVE NEGATIVE mg/dL   Nitrite NEGATIVE NEGATIVE   Leukocytes, UA NEGATIVE NEGATIVE  Pregnancy, urine POC     Status: None   Collection Time: 01/03/16 11:43 PM  Result Value Ref Range   Preg Test, Ur NEGATIVE NEGATIVE  CBC with Differential/Platelet     Status: Abnormal   Collection Time: 01/04/16 12:16 AM  Result Value Ref Range   WBC 8.1 4.0 - 10.5 K/uL   RBC 3.84 (L) 3.87 - 5.11 MIL/uL   Hemoglobin 12.0 12.0 - 15.0 g/dL   HCT 16.1 (L) 09.6 - 04.5 %   MCV 90.6 78.0 - 100.0 fL   MCH 31.3 26.0 - 34.0 pg   MCHC 34.5 30.0 - 36.0 g/dL   RDW 40.9 81.1 - 91.4 %   Platelets 202 150 - 400 K/uL   Neutrophils Relative % 42 %   Neutro Abs 3.4 1.7 - 7.7 K/uL   Lymphocytes Relative 48 %   Lymphs Abs 4.0 0.7 - 4.0 K/uL   Monocytes Relative 5 %   Monocytes Absolute 0.4 0.1 - 1.0 K/uL   Eosinophils Relative 5 %   Eosinophils Absolute 0.4 0.0 - 0.7 K/uL   Basophils Relative 0 %   Basophils Absolute 0.0 0.0 - 0.1 K/uL  Comprehensive metabolic panel     Status: Abnormal   Collection Time: 01/04/16 12:16 AM  Result Value Ref Range   Sodium 139 135 - 145 mmol/L   Potassium 4.0 3.5 - 5.1 mmol/L   Chloride 107 101 - 111 mmol/L   CO2 28 22 - 32 mmol/L   Glucose, Bld 89 65 -  99 mg/dL   BUN 9 6 - 20 mg/dL   Creatinine, Ser 7.82 0.44 - 1.00 mg/dL   Calcium 9.0 8.9 - 95.6 mg/dL   Total Protein 6.3 (L) 6.5 - 8.1 g/dL   Albumin 3.4 (L) 3.5 - 5.0 g/dL   AST 18 15 - 41 U/L   ALT 14 14 - 54 U/L   Alkaline Phosphatase 54 38 - 126 U/L   Total Bilirubin 0.6 0.3 - 1.2 mg/dL   GFR calc non Af Amer >60 >60 mL/min  GFR calc Af Amer >60 >60 mL/min   Anion gap 4 (L) 5 - 15  Lipase, blood     Status: None   Collection Time: 01/04/16 12:16 AM  Result Value Ref Range   Lipase 21 11 - 51 U/L  Amylase     Status: None   Collection Time: 01/04/16 12:16 AM  Result Value Ref Range   Amylase 78 28 - 100 U/L    IMAGING Ct Abdomen Pelvis W Contrast  Result Date: 12/31/2015 CLINICAL DATA:  Right lower quadrant pain for 4 days. EXAM: CT ABDOMEN AND PELVIS WITH CONTRAST TECHNIQUE: Multidetector CT imaging of the abdomen and pelvis was performed using the standard protocol following bolus administration of intravenous contrast. CONTRAST:  100mL ISOVUE-300 IOPAMIDOL (ISOVUE-300) INJECTION 61% COMPARISON:  None. FINDINGS: Lower chest: No acute abnormality. Hepatobiliary: No focal liver abnormality is seen. No gallstones, gallbladder wall thickening, or biliary dilatation. Pancreas: Unremarkable. No pancreatic ductal dilatation or surrounding inflammatory changes. Spleen: Normal in size without focal abnormality. Adrenals/Urinary Tract: Adrenal glands are unremarkable. Kidneys are normal, without renal calculi, focal lesion, or hydronephrosis. Bladder is unremarkable. Stomach/Bowel: The appendix is normal. Stomach, small bowel and colon are normal. Vascular/Lymphatic: No significant vascular findings are present. No enlarged abdominal or pelvic lymph nodes. Reproductive: Uterus and bilateral adnexa are unremarkable. Other: No focal inflammatory changes are evident in the abdomen or pelvis. There is scant volume free pelvic fluid. Musculoskeletal: No acute or significant osseous findings.  IMPRESSION: Normal appendix.  No acute findings. Electronically Signed   By: Ellery Plunkaniel R Mitchell M.D.   On: 12/31/2015 06:15    MAU COURSE Orders Placed This Encounter  Procedures  . Urinalysis, Routine w reflex microscopic (not at [facility])  . CBC with Differential/Platelet  . Comprehensive metabolic panel  . Lipase, blood  . Amylase  . Pregnancy, urine POC  . Discharge patient   Meds ordered this encounter  Medications  . simethicone (MYLICON) 80 MG chewable tablet    Sig: Chew 80 mg by mouth every 6 (six) hours as needed for flatulence.  Marland Kitchen. ketorolac (TORADOL) injection 60 mg  . gi cocktail (Maalox,Lidocaine,Donnatal)   Pain now 2/10 on pain scale. Patient sleeping.  MDM - Upper abdominal pain of uncertain ideology. CBC, CNM, amylase, lipase normal. Suspect constipatio/trapped gas although patient denies these. May also be experiencing reflux. We'll treat for both of these and see if pain resolves. Patient is nontoxic-appearing. No surgical abdomen.  ASSESSMENT 1. Acute epigastric pain   2.      Constiption  PLAN Discharge home in stable condition. Abdominal pain precautions [facility] for non-OB/GYN emergencies. Follow-up Information    Primary care provider of your choice Follow up.   Why:  As needed if no improvement after 2-3 days of treatment.       [facility] .   Contact information: [address]  [place] [phone]       [facility] Follow up.   Contact information: [address] [place] [phone]           Medication List    STOP taking these medications   MULTIVITAMIN PO   ondansetron 8 MG disintegrating tablet Commonly known as:  ZOFRAN ODT     TAKE these medications   albuterol 108 (90 Base) MCG/ACT inhaler Commonly known as:  PROVENTIL HFA;VENTOLIN HFA Inhale 1-2 puffs into the lungs every 6 (six)  hours as  needed for wheezing or shortness of breath.   famotidine 20 MG tablet Commonly known as:  PEPCID Take 1 tablet (20 mg total) by mouth 2 (two) times daily.   ketorolac 10 MG tablet Commonly known as:  TORADOL Take 1 tablet (10 mg total) by mouth every 6 (six) hours as needed.   polyethylene glycol powder powder Commonly known as:  GLYCOLAX/MIRALAX Take 17 g by mouth daily as needed for moderate constipation.   simethicone 80 MG chewable tablet Commonly known as:  MYLICON Chew 80 mg by mouth every 6 (six) hours as needed for flatulence.        Dorathy KinsmanVirginia Darryle Dennie, CNM 01/04/2016  1:13 AM

## 2016-01-04 NOTE — Discharge Instructions (Signed)
° °  Abdominal Pain, Adult Many things can cause abdominal pain. Usually, abdominal pain is not caused by a disease and will improve without treatment. It can often be observed and treated at home. Your health care provider will do a physical exam and possibly order blood tests and X-rays to help determine the seriousness of your pain. However, in many cases, more time must pass before a clear cause of the pain can be found. Before that point, your health care provider may not know if you need more testing or further treatment. HOME CARE INSTRUCTIONS Monitor your abdominal pain for any changes. The following actions may help to alleviate any discomfort you are experiencing:  Only take over-the-counter or prescription medicines as directed by your health care provider.  Do not take laxatives unless directed to do so by your health care provider.  Try a clear liquid diet (broth, tea, or water) as directed by your health care provider. Slowly move to a bland diet as tolerated. SEEK MEDICAL CARE IF:  You have unexplained abdominal pain.  You have abdominal pain associated with nausea or diarrhea.  You have pain when you urinate or have a bowel movement.  You experience abdominal pain that wakes you in the night.  You have abdominal pain that is worsened or improved by eating food.  You have abdominal pain that is worsened with eating fatty foods.  You have a fever. SEEK IMMEDIATE MEDICAL CARE IF:  Your pain does not go away within 2 hours.  You keep throwing up (vomiting).  Your pain is felt only in portions of the abdomen, such as the right side or the left lower portion of the abdomen.  You pass bloody or black tarry stools. MAKE SURE YOU:  Understand these instructions.  Will watch your condition.  Will get help right away if you are not doing well or get worse.   This information is not intended to replace advice given to you by your health care provider. Make sure you  discuss any questions you have with your health care provider.   Document Released: 11/21/2004 Document Revised: 11/02/2014 Document Reviewed: 10/21/2012 Elsevier Interactive Patient Education [employer]2016 Elsevier Inc.  Primary Care Resources:  [facility]  [address] [place] [address] Ph [phone] Every 2nd Saturday 9am-12pm  [email or web address] FREE Services  - [facility]  [address] [place] Ph [phone]  [address] [place] Ph [phone]  [email or web address] $45 per visit/Walk-in only  - [facility]  [address] [place] [address] Ph [phone]  1st & 3rd Saturday of each month 9:30am-12:30pm [email or web address] Sliding fee scale/Call to make an appointment  - [facility]  [address] [place] Ph [phone]  Hours Mon-Fri 9am-7pm & Sat 9am-1pm [email or web address] Visits start at $45 per visit/Call to make an appointment  [facility]  [address] [place] [address] Ph [phone]  Hours Mon-Wed 8:30am-5pm & Thurs 8:30am-8pm $5 per visit/Call for an eligibility appointment

## 2016-05-25 ENCOUNTER — Inpatient Hospital Stay (HOSPITAL_COMMUNITY)
Admission: AD | Admit: 2016-05-25 | Discharge: 2016-05-25 | Disposition: A | Discharge status: Home / Self Care | Payer: Medicare Other | Source: Ambulatory Visit | Attending: Obstetrics & Gynecology | Admitting: Obstetrics & Gynecology

## 2016-05-25 ENCOUNTER — Encounter (HOSPITAL_COMMUNITY): Payer: Self-pay | Admitting: *Deleted

## 2016-05-25 DIAGNOSIS — B9689 Other specified bacterial agents as the cause of diseases classified elsewhere: Secondary | ICD-10-CM | POA: Diagnosis not present

## 2016-05-25 DIAGNOSIS — N760 Acute vaginitis: Secondary | ICD-10-CM | POA: Insufficient documentation

## 2016-05-25 LAB — URINALYSIS, ROUTINE W REFLEX MICROSCOPIC
BILIRUBIN URINE: NEGATIVE
GLUCOSE, UA: NEGATIVE mg/dL
HGB URINE DIPSTICK: NEGATIVE
Ketones, ur: NEGATIVE mg/dL
Leukocytes, UA: NEGATIVE
Nitrite: NEGATIVE
PROTEIN: NEGATIVE mg/dL
Specific Gravity, Urine: 1.016 (ref 1.005–1.030)
pH: 7 (ref 5.0–8.0)

## 2016-05-25 LAB — WET PREP, GENITAL
Sperm: NONE SEEN
Trich, Wet Prep: NONE SEEN
WBC WET PREP: NONE SEEN
Yeast Wet Prep HPF POC: NONE SEEN

## 2016-05-25 LAB — POCT PREGNANCY, URINE: PREG TEST UR: NEGATIVE

## 2016-05-25 MED ORDER — FLUCONAZOLE 150 MG PO TABS: 150.0000 mg | ORAL_TABLET | Freq: Every day | ORAL | 0 refills | Status: DC

## 2016-05-25 MED ORDER — METRONIDAZOLE 500 MG PO TABS: 500.0000 mg | ORAL_TABLET | Freq: Two times a day (BID) | ORAL | 0 refills | Status: DC

## 2016-05-25 NOTE — Discharge Instructions (Signed)

## 2016-05-25 NOTE — MAU Note (Signed)
Patient had period 05/06/16 has irregular periods, started noticing discharge which has an odor and thick. No itching.

## 2016-05-25 NOTE — Progress Notes (Signed)
Venia Carbon NP discussed scripts and d/c plan with pt in Triage. WRitten and verbal d/c instructions given and understanding voiced.

## 2016-05-25 NOTE — Progress Notes (Signed)
Swabs collected by 'blind' swab by RN

## 2016-05-25 NOTE — MAU Provider Note (Signed)
S:   [name] is a 21 y.o. female G0P0000 non pregnant, here in MAU with complaints of vaginitis and irritation for a few days. She tried over the counter yeast infection medication without relief.    O:  GENERAL: Well-developed, well-nourished female in no acute distress.  LUNGS: Effort normal SKIN: Warm, dry and without erythema PSYCH: Normal mood and affect  Vitals:   05/25/16 1817 05/25/16 2026  BP: 107/63 109/63  Pulse: 61 64  Resp: 16 16  Temp: 98.2 F (36.8 C)    Results for orders placed or performed during the hospital encounter of 05/25/16 (from the past 48 hour(s))  Urinalysis, Routine w reflex microscopic     Status: None   Collection Time: 05/25/16  6:20 PM  Result Value Ref Range   Color, Urine YELLOW YELLOW   APPearance CLEAR CLEAR   Specific Gravity, Urine 1.016 1.005 - 1.030   pH 7.0 5.0 - 8.0   Glucose, UA NEGATIVE NEGATIVE mg/dL   Hgb urine dipstick NEGATIVE NEGATIVE   Bilirubin Urine NEGATIVE NEGATIVE   Ketones, ur NEGATIVE NEGATIVE mg/dL   Protein, ur NEGATIVE NEGATIVE mg/dL   Nitrite NEGATIVE NEGATIVE   Leukocytes, UA NEGATIVE NEGATIVE  Pregnancy, urine POC     Status: None   Collection Time: 05/25/16  6:31 PM  Result Value Ref Range   Preg Test, Ur NEGATIVE NEGATIVE    Comment:        THE SENSITIVITY OF THIS METHODOLOGY IS >24 mIU/mL   Wet prep, genital     Status: Abnormal   Collection Time: 05/25/16  7:36 PM  Result Value Ref Range   Yeast Wet Prep HPF POC NONE SEEN NONE SEEN   Trich, Wet Prep NONE SEEN NONE SEEN   Clue Cells Wet Prep HPF POC PRESENT (A) NONE SEEN   WBC, Wet Prep HPF POC NONE SEEN NONE SEEN    Comment: MANY BACTERIA SEEN   Sperm NONE SEEN      MDM  Patient self collected swabs   A:  1. Bacterial vaginitis   2. Acute vaginitis     P:  Discharge home in stable condition Rx: Flagyl, diflucan to take once flagyl is complete Return to MAU for emergencies only  Duane Lope, NP 05/27/2016 8:40  AM

## 2016-05-25 NOTE — MAU Note (Signed)
Jennifer Rasch NP in Triage to discuss test results and plan of care with pt 

## 2016-05-27 LAB — GC/CHLAMYDIA PROBE AMP (~~LOC~~) NOT AT ARMC
CHLAMYDIA, DNA PROBE: NEGATIVE
NEISSERIA GONORRHEA: NEGATIVE

## 2016-07-18 ENCOUNTER — Emergency Department (HOSPITAL_COMMUNITY)
Admission: EM | Admit: 2016-07-18 | Discharge: 2016-07-18 | Disposition: A | Discharge status: Home / Self Care | Payer: Medicare Other | Attending: Emergency Medicine | Admitting: Emergency Medicine

## 2016-07-18 ENCOUNTER — Encounter (HOSPITAL_COMMUNITY): Payer: Self-pay | Admitting: Emergency Medicine

## 2016-07-18 DIAGNOSIS — J4520 Mild intermittent asthma, uncomplicated: Secondary | ICD-10-CM | POA: Diagnosis not present

## 2016-07-18 DIAGNOSIS — Z87891 Personal history of nicotine dependence: Secondary | ICD-10-CM | POA: Insufficient documentation

## 2016-07-18 DIAGNOSIS — Z91040 Latex allergy status: Secondary | ICD-10-CM | POA: Insufficient documentation

## 2016-07-18 DIAGNOSIS — J069 Acute upper respiratory infection, unspecified: Secondary | ICD-10-CM | POA: Diagnosis not present

## 2016-07-18 DIAGNOSIS — Z91010 Allergy to peanuts: Secondary | ICD-10-CM | POA: Diagnosis not present

## 2016-07-18 DIAGNOSIS — Z79899 Other long term (current) drug therapy: Secondary | ICD-10-CM | POA: Insufficient documentation

## 2016-07-18 DIAGNOSIS — R0602 Shortness of breath: Secondary | ICD-10-CM | POA: Diagnosis present

## 2016-07-18 MED ORDER — ALBUTEROL SULFATE HFA 108 (90 BASE) MCG/ACT IN AERS
2.0000 | INHALATION_SPRAY | RESPIRATORY_TRACT | Status: DC | PRN
  Administered 2016-07-18: 2 via RESPIRATORY_TRACT
  Filled 2016-07-18: qty 6.7

## 2016-07-18 MED ORDER — GUAIFENESIN 100 MG/5ML PO LIQD: 100.0000 mg | ORAL | 0 refills | Status: DC | PRN

## 2016-07-18 MED ORDER — PREDNISONE 20 MG PO TABS: ORAL_TABLET | ORAL | 0 refills | Status: DC

## 2016-07-18 MED ORDER — ALBUTEROL SULFATE (2.5 MG/3ML) 0.083% IN NEBU
5.0000 mg | INHALATION_SOLUTION | Freq: Once | RESPIRATORY_TRACT | Status: AC
  Administered 2016-07-18: 5 mg via RESPIRATORY_TRACT
  Filled 2016-07-18: qty 6

## 2016-07-18 NOTE — ED Provider Notes (Signed)
[facility]-EMERGENCY DEPT Provider Note   CSN: [id number] Arrival date & time: 07/18/16  2053   By signing my name below, I, Clarisse GougeXavier Herndon, attest that this documentation has been prepared under the direction [id number] in the presence of Fayrene HelperBowie Sahand Gosch, PA-C. Electronically signed, Clarisse GougeXavier Herndon, ED Scribe. 07/18/16. 9:57 PM.   History   Chief Complaint Chief Complaint  Patient presents with  . Asthma   The history is provided by the patient and medical records. No language interpreter was used.    Misty Sanders is a 21 y.o. female with h/o asthma who presents to the Emergency Department with concern for SOB onset while driving. Associated wheezing noted. She states this is consistent with h/o asthma flare ups. Productive cough noted also; she notes 1 sick contact with URI symptoms. Pt adds headache, congestion, body aches and sore throat. Pt states she has not had an inhaler since last year; she reports no asthma flare ups in 3 years. No fever or rhinorrhea noted. No other complaints at this time.     Past Medical History:  Diagnosis Date  . Arthritis   . Asthma     There are no active problems to display for this patient.   Past Surgical History:  Procedure Laterality Date  . ADENOIDECTOMY    . TONSILLECTOMY    . WISDOM TOOTH EXTRACTION      OB History    Gravida Para Term Preterm AB Living   0 0 0 0 0 0   SAB TAB Ectopic Multiple Live Births   0 0 0 0 0       Home Medications    Prior to Admission medications   Medication Sig Start Date End Date Taking? Authorizing Provider  albuterol (PROVENTIL HFA;VENTOLIN HFA) 108 (90 BASE) MCG/ACT inhaler Inhale 1-2 puffs into the lungs every 6 (six) hours as needed for wheezing or shortness of breath.    [provider]  famotidine (PEPCID) 20 MG tablet Take 1 tablet (20 mg total) by mouth 2 (two) times daily. 01/04/16   Katrinka BlazingSmith, [place], CNM  fluconazole (DIFLUCAN) 150 MG tablet Take 1 tablet (150 mg total) by mouth daily.  05/25/16   Rasch, Victorino DikeJennifer I, NP  ketorolac (TORADOL) 10 MG tablet Take 1 tablet (10 mg total) by mouth every 6 (six) hours as needed. 01/04/16   Katrinka BlazingSmith, [place], CNM  metroNIDAZOLE (FLAGYL) 500 MG tablet Take 1 tablet (500 mg total) by mouth 2 (two) times daily. 05/25/16   Rasch, Victorino DikeJennifer I, NP  polyethylene glycol powder (GLYCOLAX/MIRALAX) powder Take 17 g by mouth daily as needed for moderate constipation. 01/04/16   Katrinka BlazingSmith, [place], CNM  simethicone (MYLICON) 80 MG chewable tablet Chew 80 mg by mouth every 6 (six) hours as needed for flatulence.    [provider]    Family History Family History  Problem Relation Age of Onset  . Diabetes Mother   . Hypertension Mother   . Hyperlipidemia Mother     Social History Social History  Substance Use Topics  . Smoking status: Former Smoker    Packs/day: 0.25    Types: Cigarettes  . Smokeless tobacco: Never Used  . Alcohol use No     Allergies   Peanuts [peanut oil]; Penicillins; Sulfa antibiotics; and Latex   Review of Systems Review of Systems  Constitutional: Negative for fever.  HENT: Positive for congestion and sore throat. Negative for rhinorrhea.   Respiratory: Positive for cough, shortness of breath and wheezing.   Gastrointestinal: Negative for abdominal  pain.  Musculoskeletal: Positive for arthralgias and myalgias.  All other systems reviewed and are negative.    Physical Exam Updated Vital Signs BP 112/61 (BP Location: Right Arm)   Pulse 82   Temp 97.9 F (36.6 C) (Oral)   Resp (!) 24   Ht 5\' 6"  (1.676 m)   Wt 145 lb (65.8 kg)   SpO2 96%   BMI 23.40 kg/m   Physical Exam  Constitutional: She appears well-developed and well-nourished. No distress.  HENT:  Head: Atraumatic.  Right Ear: External ear normal.  Left Ear: External ear normal.  Nose: Rhinorrhea (mild) present.  Eyes: Conjunctivae are normal.  Neck: Neck supple.  Cardiovascular: Normal rate, regular rhythm and normal heart sounds.     Pulmonary/Chest: Effort normal and breath sounds normal. No respiratory distress. She has no wheezes. She has no rales.  Neurological: She is alert.  Skin: No rash noted.  Psychiatric: She has a normal mood and affect.  Nursing note and vitals reviewed.    ED Treatments / Results  DIAGNOSTIC STUDIES: Oxygen Saturation is 96% on RA, NL by my interpretation.    COORDINATION OF CARE: 9:56 PM-Discussed next steps with pt. Pt verbalized understanding and is agreeable with the plan. Will order medication. Pt prepared for d/c, advised of symptomatic care at home and return precautions.    Labs (all labs ordered are listed, but only abnormal results are displayed) Labs Reviewed - No data to display  EKG  EKG Interpretation None       Radiology No results found.  Procedures Procedures (including critical care time)  Medications Ordered in ED Medications  albuterol (PROVENTIL) (2.5 MG/3ML) 0.083% nebulizer solution 5 mg (5 mg Nebulization Given 07/18/16 2108)     Initial Impression / Assessment and Plan / ED Course  I have reviewed the triage vital signs and the nursing notes.  Pertinent labs & imaging results that were available during my care of the patient were reviewed by me and considered in my medical decision making (see chart for details).      Pt symptoms consistent with URI with mild asthma exacerbation. Pt will be discharged with symptomatic treatment.  Discussed return precautions.  Pt is hemodynamically stable & in NAD prior to discharge.   BP 112/61 (BP Location: Right Arm)   Pulse 82   Temp 97.9 F (36.6 C) (Oral)   Resp (!) 24   Ht 5\' 6"  (1.676 m)   Wt 65.8 kg (145 lb)   SpO2 96%   BMI 23.40 kg/m    Final Clinical Impressions(s) / ED Diagnoses   Final diagnoses:  Mild intermittent asthma without complication  Acute URI    New Prescriptions New Prescriptions   GUAIFENESIN (ROBITUSSIN) 100 MG/5ML LIQUID    Take 5-10 mLs (100-200 mg total) by  mouth every 4 (four) hours as needed for cough.   PREDNISONE (DELTASONE) 20 MG TABLET    3 tabs po day one, then 2 tabs daily x 4 days  \ I personally performed the services described in this documentation, which was scribed in my presence. The recorded information has been reviewed and is accurate.      Fayrene Helper, PA-C 07/18/16 2212    Little, Ambrose [place], MD 07/23/16 [phone]

## 2016-07-18 NOTE — ED Triage Notes (Signed)
Pt reports her asthma started flaring up today around 1300.  Pt exhibiting expiratory wheezes.  States she does not have an inhaler at home.  Pt ambulatory and A&O x4.

## 2016-07-19 ENCOUNTER — Emergency Department (HOSPITAL_COMMUNITY): Payer: Medicare Other

## 2016-07-19 ENCOUNTER — Encounter (HOSPITAL_COMMUNITY): Payer: Self-pay | Admitting: Emergency Medicine

## 2016-07-19 ENCOUNTER — Emergency Department (HOSPITAL_COMMUNITY)
Admission: EM | Admit: 2016-07-19 | Discharge: 2016-07-19 | Disposition: A | Discharge status: Home / Self Care | Payer: Medicare Other | Attending: Emergency Medicine | Admitting: Emergency Medicine

## 2016-07-19 DIAGNOSIS — Z79899 Other long term (current) drug therapy: Secondary | ICD-10-CM | POA: Diagnosis not present

## 2016-07-19 DIAGNOSIS — Z91040 Latex allergy status: Secondary | ICD-10-CM | POA: Diagnosis not present

## 2016-07-19 DIAGNOSIS — Z91010 Allergy to peanuts: Secondary | ICD-10-CM | POA: Diagnosis not present

## 2016-07-19 DIAGNOSIS — Z87891 Personal history of nicotine dependence: Secondary | ICD-10-CM | POA: Insufficient documentation

## 2016-07-19 DIAGNOSIS — R062 Wheezing: Secondary | ICD-10-CM | POA: Diagnosis present

## 2016-07-19 DIAGNOSIS — J45901 Unspecified asthma with (acute) exacerbation: Secondary | ICD-10-CM

## 2016-07-19 DIAGNOSIS — J45998 Other asthma: Secondary | ICD-10-CM | POA: Insufficient documentation

## 2016-07-19 MED ORDER — IPRATROPIUM-ALBUTEROL 0.5-2.5 (3) MG/3ML IN SOLN
3.0000 mL | Freq: Once | RESPIRATORY_TRACT | Status: AC
  Administered 2016-07-19: 3 mL via RESPIRATORY_TRACT
  Filled 2016-07-19: qty 3

## 2016-07-19 MED ORDER — PREDNISONE 20 MG PO TABS
60.0000 mg | ORAL_TABLET | Freq: Once | ORAL | Status: AC
  Administered 2016-07-19: 60 mg via ORAL
  Filled 2016-07-19: qty 3

## 2016-07-19 MED ORDER — ONDANSETRON 8 MG PO TBDP
8.0000 mg | ORAL_TABLET | Freq: Once | ORAL | Status: AC
  Administered 2016-07-19: 8 mg via ORAL
  Filled 2016-07-19: qty 1

## 2016-07-19 NOTE — ED Triage Notes (Signed)
Pt reports being seen on 07/18/16 for shortness of breath and wheezing. Pt was discharged and used prescribed inhaler. Pt states that she has been having increasing wheezing and coughing. Pt currently alert and oriented x 4. Wheezing noted throughout predominantly on right field.

## 2016-07-19 NOTE — ED Notes (Signed)
Patient currently vomiting light green liquid.

## 2016-07-19 NOTE — ED Notes (Signed)
MD notified patient wants pain medication.

## 2016-07-19 NOTE — ED Provider Notes (Signed)
[facility]-EMERGENCY DEPT Provider Note   CSN: [id number] Arrival date & time: 07/19/16  0227     History   Chief Complaint Chief Complaint  Patient presents with  . Wheezing    HPI Misty Sanders is a 21 y.o. female.  Patient presents to the emergency department with chief complaint of wheezing. She states that her asthma has flared up again. She was seen earlier this evening for the same. Was given an inhaler and a prescription for prednisone. She has not gotten the prednisone yet. She states that her symptoms worsened while she was sleeping. She was given a breathing treatment in triage, and reports some improvement. She states that she still feels tight in her chest. She denies any fevers or chills, but does report that she has had a cough. There are no other associated symptoms or modifying factors.   The history is provided by the patient. No language interpreter was used.    Past Medical History:  Diagnosis Date  . Arthritis   . Asthma     There are no active problems to display for this patient.   Past Surgical History:  Procedure Laterality Date  . ADENOIDECTOMY    . TONSILLECTOMY    . WISDOM TOOTH EXTRACTION      OB History    Gravida Para Term Preterm AB Living   0 0 0 0 0 0   SAB TAB Ectopic Multiple Live Births   0 0 0 0 0       Home Medications    Prior to Admission medications   Medication Sig Start Date End Date Taking? Authorizing Provider  albuterol (PROVENTIL HFA;VENTOLIN HFA) 108 (90 BASE) MCG/ACT inhaler Inhale 1-2 puffs into the lungs every 6 (six) hours as needed for wheezing or shortness of breath.    [provider]  famotidine (PEPCID) 20 MG tablet Take 1 tablet (20 mg total) by mouth 2 (two) times daily. 01/04/16   Katrinka Blazing, [place], CNM  fluconazole (DIFLUCAN) 150 MG tablet Take 1 tablet (150 mg total) by mouth daily. 05/25/16   Rasch, Victorino Dike I, NP  guaiFENesin (ROBITUSSIN) 100 MG/5ML liquid Take 5-10 mLs (100-200 mg total) by  mouth every 4 (four) hours as needed for cough. 07/18/16   Fayrene Helper, PA-C  ketorolac (TORADOL) 10 MG tablet Take 1 tablet (10 mg total) by mouth every 6 (six) hours as needed. 01/04/16   Katrinka Blazing, [place], CNM  metroNIDAZOLE (FLAGYL) 500 MG tablet Take 1 tablet (500 mg total) by mouth 2 (two) times daily. 05/25/16   Rasch, Victorino Dike I, NP  polyethylene glycol powder (GLYCOLAX/MIRALAX) powder Take 17 g by mouth daily as needed for moderate constipation. 01/04/16   Katrinka Blazing, [place], CNM  predniSONE (DELTASONE) 20 MG tablet 3 tabs po day one, then 2 tabs daily x 4 days 07/18/16   Fayrene Helper, PA-C  simethicone (MYLICON) 80 MG chewable tablet Chew 80 mg by mouth every 6 (six) hours as needed for flatulence.    [provider]    Family History Family History  Problem Relation Age of Onset  . Diabetes Mother   . Hypertension Mother   . Hyperlipidemia Mother     Social History Social History  Substance Use Topics  . Smoking status: Former Smoker    Packs/day: 0.25    Types: Cigarettes  . Smokeless tobacco: Never Used  . Alcohol use No     Allergies   Peanuts [peanut oil]; Penicillins; Sulfa antibiotics; and Latex   Review of Systems Review  of Systems  All other systems reviewed and are negative.    Physical Exam Updated Vital Signs BP 120/69 (BP Location: Left Arm)   Pulse 97   Temp 98.4 F (36.9 C) (Oral)   Resp (!) 24   Ht 5\' 6"  (1.676 m)   Wt 65.8 kg (145 lb)   LMP 06/06/2016   SpO2 99%   BMI 23.40 kg/m   Physical Exam  Constitutional: She is oriented to person, place, and time. She appears well-developed and well-nourished.  HENT:  Head: Normocephalic and atraumatic.  Eyes: Conjunctivae and EOM are normal. Pupils are equal, round, and reactive to light.  Neck: Normal range of motion. Neck supple.  Cardiovascular: Normal rate and regular rhythm.  Exam reveals no gallop and no friction rub.   No murmur heard. Pulmonary/Chest: Effort normal and breath sounds  normal. No respiratory distress. She has no wheezes. She has no rales. She exhibits no tenderness.  CTAB  Abdominal: Soft. Bowel sounds are normal. She exhibits no distension and no mass. There is no tenderness. There is no rebound and no guarding.  Musculoskeletal: Normal range of motion. She exhibits no edema or tenderness.  Neurological: She is alert and oriented to person, place, and time.  Skin: Skin is warm and dry.  Psychiatric: She has a normal mood and affect. Her behavior is normal. Judgment and thought content normal.  Nursing note and vitals reviewed.    ED Treatments / Results  Labs (all labs ordered are listed, but only abnormal results are displayed) Labs Reviewed - No data to display  EKG  EKG Interpretation None       Radiology Dg Chest 2 View  Result Date: 07/19/2016 CLINICAL DATA:  Increased wheezing and coughing. Previously seen on 07/18/2016 for shortness of breath and wheezing and was discharged with inhaler. History of asthma. Former smoker, quit 2 months ago. EXAM: CHEST  2 VIEW COMPARISON:  06/05/2014 FINDINGS: Normal heart size and pulmonary vascularity. No focal airspace disease or consolidation in the lungs. No blunting of costophrenic angles. No pneumothorax. Mediastinal contours appear intact. IMPRESSION: No active cardiopulmonary disease. Electronically Signed   By: Burman NievesWilliam  Stevens M.D.   On: 07/19/2016 03:21    Procedures Procedures (including critical care time)  Peak flow is adequate.  Medications Ordered in ED Medications  predniSONE (DELTASONE) tablet 60 mg (not administered)  ipratropium-albuterol (DUONEB) 0.5-2.5 (3) MG/3ML nebulizer solution 3 mL (not administered)  ondansetron (ZOFRAN-ODT) disintegrating tablet 8 mg (not administered)  ipratropium-albuterol (DUONEB) 0.5-2.5 (3) MG/3ML nebulizer solution 3 mL (3 mLs Nebulization Given 07/19/16 0250)     Initial Impression / Assessment and Plan / ED Course  I have reviewed the triage  vital signs and the nursing notes.  Pertinent labs & imaging results that were available during my care of the patient were reviewed by me and considered in my medical decision making (see chart for details).     Patient ambulated in ED with O2 saturations maintained >90, no current signs of respiratory distress. Lung exam improved after nebulizer treatment. Prednisone given in the ED and pt will bd dc with 5 day burst (given earlier today). Pt states they are breathing at baseline. Pt has been instructed to continue using prescribed medications and to speak with PCP about today's exacerbation.    Final Clinical Impressions(s) / ED Diagnoses   Final diagnoses:  Exacerbation of asthma, unspecified asthma severity, unspecified whether persistent    New Prescriptions New Prescriptions   No medications on file  Roxy Horseman, PA-C 07/19/16 4034    Gilda Crease, MD 07/19/16 [phone]

## 2016-07-19 NOTE — ED Notes (Signed)
Called Resp to do peak flow and treatment.

## 2016-07-19 NOTE — ED Notes (Signed)
Pt. Walked down the hall and back to her room on 97% room air and 114 heart rate. Pt. Gait steady on her feet.

## 2016-09-17 ENCOUNTER — Emergency Department (HOSPITAL_COMMUNITY)
Admission: EM | Admit: 2016-09-17 | Discharge: 2016-09-17 | Disposition: A | Discharge status: Home / Self Care | Payer: Medicare Other | Attending: Emergency Medicine | Admitting: Emergency Medicine

## 2016-09-17 ENCOUNTER — Encounter (HOSPITAL_COMMUNITY): Payer: Self-pay | Admitting: Emergency Medicine

## 2016-09-17 DIAGNOSIS — B9689 Other specified bacterial agents as the cause of diseases classified elsewhere: Secondary | ICD-10-CM

## 2016-09-17 DIAGNOSIS — N760 Acute vaginitis: Secondary | ICD-10-CM

## 2016-09-17 DIAGNOSIS — Z91010 Allergy to peanuts: Secondary | ICD-10-CM | POA: Insufficient documentation

## 2016-09-17 DIAGNOSIS — Z87891 Personal history of nicotine dependence: Secondary | ICD-10-CM | POA: Insufficient documentation

## 2016-09-17 DIAGNOSIS — B353 Tinea pedis: Secondary | ICD-10-CM | POA: Insufficient documentation

## 2016-09-17 DIAGNOSIS — J45909 Unspecified asthma, uncomplicated: Secondary | ICD-10-CM | POA: Insufficient documentation

## 2016-09-17 DIAGNOSIS — Z91040 Latex allergy status: Secondary | ICD-10-CM | POA: Insufficient documentation

## 2016-09-17 DIAGNOSIS — N898 Other specified noninflammatory disorders of vagina: Secondary | ICD-10-CM | POA: Diagnosis present

## 2016-09-17 LAB — URINALYSIS, ROUTINE W REFLEX MICROSCOPIC
Bilirubin Urine: NEGATIVE
Glucose, UA: NEGATIVE mg/dL
Hgb urine dipstick: NEGATIVE
Ketones, ur: 20 mg/dL — AB
Leukocytes, UA: NEGATIVE
Nitrite: NEGATIVE
Protein, ur: NEGATIVE mg/dL
Specific Gravity, Urine: 1.029 (ref 1.005–1.030)
pH: 6 (ref 5.0–8.0)

## 2016-09-17 LAB — WET PREP, GENITAL
Sperm: NONE SEEN
Trich, Wet Prep: NONE SEEN
Yeast Wet Prep HPF POC: NONE SEEN

## 2016-09-17 MED ORDER — METRONIDAZOLE 500 MG PO TABS
500.0000 mg | ORAL_TABLET | Freq: Once | ORAL | Status: AC
  Administered 2016-09-17: 500 mg via ORAL
  Filled 2016-09-17: qty 1

## 2016-09-17 MED ORDER — FLUCONAZOLE 200 MG PO TABS: 200.0000 mg | ORAL_TABLET | ORAL | 0 refills | Status: AC

## 2016-09-17 MED ORDER — METRONIDAZOLE 500 MG PO TABS: 500.0000 mg | ORAL_TABLET | Freq: Two times a day (BID) | ORAL | 0 refills | Status: AC

## 2016-09-17 MED FILL — FLUCONAZOLE 200 MG TABLET: 200 | 42 days supply | Qty: 6 | Fill #0

## 2016-09-17 MED FILL — metroNIDAZOLE 500 MG TABS: 500 | 7 days supply | Qty: 14 | Fill #0

## 2016-09-17 NOTE — ED Provider Notes (Signed)
Tinea pedis [facility]-EMERGENC[facility] DEPT Provider Note   CSN: [id number] Arrival date & time: 09/17/16  1041     History   Chief Complaint Chief Complaint  Patient presents with  . Vaginal Discharge  . Foot Pain    HPI Misty Sanders is a 21 y.o. female who presents today with complaints of persistent white vaginal discharge for 1 month and pruritic skin changes to bilateral feet for greater than 1 year. She states that vaginal discharge is malodorous and presents after her menstrual cycle frequently. Last menstrual cycle was 3 weeks ago and her symptoms began shortly thereafter. She states that she comes to the ED frequently and is treated for BV and yeast infections. Denies vaginal pain, abnormal vaginal bleeding, or vaginal itching. Has not tried anything for her symptoms. States that she is sexually active with one female partner who is not complaining of symptoms. Triage note makes mention of abdominal pain, but patient denies this. She also denies nausea, vomiting, fevers, chills, melena, hematochezia, dysuria, hematuria. She does endorse urinary frequency which she feels is related to her vaginal discharge.  Also complaining of pruritic rash to bilateral feet which has been progressively worsening for the past year. Rash is not painful. She has skin changes between the toes as well. She has tried multiple rounds of topical medication given to her by her primary care physician which have not been helpful. Also notes discoloration and brittle great toenails.   The history is provided by the patient.    Past Medical History:  Diagnosis Date  . Arthritis   . Asthma     There are no active problems to display for this patient.   Past Surgical History:  Procedure Laterality Date  . ADENOIDECTOMY    . TONSILLECTOMY    . WISDOM TOOTH EXTRACTION      OB History    Gravida Para Term Preterm AB Living   0 0 0 0 0 0   SAB TAB Ectopic Multiple Live Births   0 0 0 0 0       Home  Medications    Prior to Admission medications   Medication Sig Start Date End Date Taking? Authorizing Provider  albuterol (PROVENTIL HFA;VENTOLIN HFA) 108 (90 BASE) MCG/ACT inhaler Inhale 1-2 puffs into the lungs every 6 (six) hours as needed for wheezing or shortness of breath.   Yes [provider]  fluconazole (DIFLUCAN) 200 MG tablet Take 1 tablet (200 mg total) by mouth once a week. 09/17/16 09/23/16  Michela PitcherFawze, Kevis Qu A, PA-C  metroNIDAZOLE (FLAGYL) 500 MG tablet Take 1 tablet (500 mg total) by mouth 2 (two) times daily. 09/17/16 09/24/16  Jeanie SewerFawze, Mirriam Vadala A, PA-C    Family History Family History  Problem Relation Age of Onset  . Diabetes Mother   . Hypertension Mother   . Hyperlipidemia Mother     Social History Social History  Substance Use Topics  . Smoking status: Former Smoker    Packs/day: 0.25    Types: Cigarettes  . Smokeless tobacco: Never Used  . Alcohol use No     Allergies   Penicillins; Peanuts [peanut oil]; Sulfa antibiotics; and Latex   Review of Systems Review of Systems  Constitutional: Negative for chills and fever.  Respiratory: Negative for shortness of breath.   Cardiovascular: Negative for chest pain.  Gastrointestinal: Negative for abdominal pain, blood in stool, constipation, diarrhea, nausea and vomiting.  Genitourinary: Positive for frequency and vaginal discharge. Negative for dysuria, hematuria, urgency, vaginal bleeding and  vaginal pain.  Musculoskeletal: Negative for back pain.  Skin: Positive for rash.  Neurological: Negative for syncope.     Physical Exam Updated Vital Signs BP 108/75 (BP Location: Right Arm)   Pulse 70   Temp 98.2 F (36.8 C) (Oral)   Resp 17   SpO2 100%   Physical Exam  Constitutional: She appears well-developed and well-nourished. No distress.  HENT:  Head: Normocephalic and atraumatic.  Eyes: Conjunctivae are normal. Right eye exhibits no discharge. Left eye exhibits no discharge.  Neck: No JVD present.  No tracheal deviation present.  Cardiovascular: Normal rate and intact distal pulses.   2+ DP/PT pulses bilaterally  Pulmonary/Chest: Effort normal.  Abdominal: Soft. Bowel sounds are normal. She exhibits no distension. There is no tenderness.  Genitourinary:  Genitourinary Comments: Examination performed in the presence of a chaperone. No external lesions to the genitalia. Copious white malodorous discharge in the vaginal vault. Cervical os is closed with no bleeding. No masses or lesions to the vaginal wall. No cervical motion tenderness. no adnexal tenderness.  Musculoskeletal: Normal range of motion. She exhibits no edema.  Neurological: She is alert.  Skin: Skin is warm and dry. Capillary refill takes less than 2 seconds. Rash noted. No erythema.  Multiple hyperpigmented annular lesions to bilateral soles of feet. White, macerated skin in between the toes noted L>R. bilateral great toes dark and discolored and brittle in appearance. No tenderness to palpation elicited on palpation of the foot. No significant erythema, swelling, fluctuance, or abnormal drainage.  Psychiatric: She has a normal mood and affect. Her behavior is normal.  Nursing note and vitals reviewed.    ED Treatments / Results  Labs (all labs ordered are listed, but only abnormal results are displayed) Labs Reviewed  WET PREP, GENITAL - Abnormal; Notable for the following:       Result Value   Clue Cells Wet Prep HPF POC PRESENT (*)    WBC, Wet Prep HPF POC MANY (*)    All other components within normal limits  URINALYSIS, ROUTINE W REFLEX MICROSCOPIC - Abnormal; Notable for the following:    Ketones, ur 20 (*)    All other components within normal limits  GC/CHLAMYDIA PROBE AMP ([place]) NOT AT [facility]    EKG  EKG Interpretation None       Radiology No results found.  Procedures Procedures (including critical care time)  Medications Ordered in ED Medications  metroNIDAZOLE (FLAGYL) tablet 500 mg  (500 mg Oral Given 09/17/16 1431)     Initial Impression / Assessment and Plan / ED Course  I have reviewed the triage vital signs and the nursing notes.  Pertinent labs & imaging results that were available during my care of the patient were reviewed by me and considered in my medical decision making (see chart for details).     Patient with complaint of ongoing vaginal discharge which has been recurrent after every menstrual cycle, most recent 3 weeks ago. Also complaining of rash to both feet for 1 year. No complaint of abdominal pain and abdominal examination is unremarkable. Afebrile, vital signs are stable. Wet prep shows clue cells and WBCs consistent with BV. No evidence of yeast or trichomoniasis. Patient declines syphilis and HIV workup, which I feel is reasonable at this time. Will treat for BV with Flagyl. UA not concerning for UTI or nephrolithiasis. Patient also given prescription for boric acid vaginal suppositories. Recommend follow-up with OB/GYN for reevaluation of recurrent BV and yeast infections. Low suspicion of  PID, TOA, ectopic pregnancy, or ovarian torsion in the absence of pain or fever. Rash to bilateral feet consistent with tinea pedis. No evidence of superimposed cellulitis or abscess or other infection. Patient has failed topical antifungals, will treat with 6 week course of fluconazole per Up-to-Date recommendations. Recommend follow-up with podiatry for reevaluation. Discussed indications for return to the ED. Pt verbalized understanding of and agreement with plan and is safe for discharge home at this time.  Final Clinical Impressions(s) / ED Diagnoses   Final diagnoses:  BV (bacterial vaginosis)  Tinea pedis of both feet    New Prescriptions Discharge Medication List as of 09/17/2016  2:20 PM    START taking these medications   Details  fluconazole (DIFLUCAN) 200 MG tablet Take 1 tablet (200 mg total) by mouth once a week., Starting Tue 09/17/2016, Until Mon  09/23/2016, Print    metroNIDAZOLE (FLAGYL) 500 MG tablet Take 1 tablet (500 mg total) by mouth 2 (two) times daily., Starting Tue 09/17/2016, Until Tue 09/24/2016, Print         Luevenia Maxin, [place] A, PA-C 09/17/16 1448    Bethann Berkshire, MD 09/17/16 [phone]

## 2016-09-17 NOTE — ED Triage Notes (Signed)
Pt c/o low abdominal pain x 1 day, smooth and curdy malodorous vaginal discharge x several months, reports history of multiple yeast infections that are treated and then return. No vaginal pain, pruritus, or bleeding. Pt also c/o right foot pain x about one year, plantar surface of right foot covered in hyperpigmented macular lesions, with hypopigmented, boggy skin between toes, pt states these bleed intermittently, not currently bleeding.

## 2016-09-17 NOTE — Discharge Instructions (Signed)
Please take all of your antibiotics until finished!   You may develop abdominal discomfort or diarrhea from the antibiotic.  You may help offset this with probiotics which you can buy or get in yogurt. Do not eat  or take the probiotics until 2 hours after your antibiotic. Do not drink alcohol while on this antibiotic. Follow up with an OB/GYN for reevaluation of your recurrent BV and [place] infections.  Take the Diflucan once weekly for the next 6 weeks for treatment of your athlete's foot. Keep feet clean and dry. Wear clean socks. Follow-up with podiatry for reevaluation. Return to the ED immediately if any concerning signs or symptoms develop.

## 2016-09-18 LAB — GC/CHLAMYDIA PROBE AMP (~~LOC~~) NOT AT ARMC
Chlamydia: NEGATIVE
Neisseria Gonorrhea: NEGATIVE

## 2016-12-20 ENCOUNTER — Encounter (HOSPITAL_COMMUNITY): Payer: Self-pay | Admitting: Emergency Medicine

## 2016-12-20 ENCOUNTER — Emergency Department (HOSPITAL_COMMUNITY)
Admission: EM | Admit: 2016-12-20 | Discharge: 2016-12-20 | Disposition: A | Discharge status: Home / Self Care | Payer: Medicare Other | Attending: Emergency Medicine | Admitting: Emergency Medicine

## 2016-12-20 DIAGNOSIS — J069 Acute upper respiratory infection, unspecified: Secondary | ICD-10-CM | POA: Diagnosis not present

## 2016-12-20 DIAGNOSIS — Z79899 Other long term (current) drug therapy: Secondary | ICD-10-CM | POA: Insufficient documentation

## 2016-12-20 DIAGNOSIS — Z87891 Personal history of nicotine dependence: Secondary | ICD-10-CM | POA: Insufficient documentation

## 2016-12-20 DIAGNOSIS — J45909 Unspecified asthma, uncomplicated: Secondary | ICD-10-CM | POA: Insufficient documentation

## 2016-12-20 DIAGNOSIS — R05 Cough: Secondary | ICD-10-CM | POA: Diagnosis present

## 2016-12-20 DIAGNOSIS — Z91010 Allergy to peanuts: Secondary | ICD-10-CM | POA: Diagnosis not present

## 2016-12-20 DIAGNOSIS — Z91040 Latex allergy status: Secondary | ICD-10-CM | POA: Insufficient documentation

## 2016-12-20 MED ORDER — ALBUTEROL SULFATE HFA 108 (90 BASE) MCG/ACT IN AERS: 1.0000 | INHALATION_SPRAY | Freq: Four times a day (QID) | RESPIRATORY_TRACT | 0 refills | Status: DC | PRN

## 2016-12-20 MED ORDER — BENZONATATE 100 MG PO CAPS: 100.0000 mg | ORAL_CAPSULE | Freq: Three times a day (TID) | ORAL | 0 refills | Status: DC

## 2016-12-20 MED FILL — BENZONATATE 100 MG CAP: 100 | 7 days supply | Qty: 21 | Fill #0

## 2016-12-20 NOTE — ED Provider Notes (Signed)
[facility]   CSN: [id number] Arrival date & time: 12/20/16  0605     History   Chief Complaint Chief Complaint  Patient presents with  . Influenza    HPI Misty Sanders is a 21 y.o. female.  HPI   21 year old female presents today with complaints of upper respiratory congestion.  Patient reports approximately 2 days ago she started to develop nasal congestion and rhinorrhea.  She notes this slowly developed into sore throat and dry nonproductive cough.  Patient notes chills at home, no objective fever.  She notes using Mucinex last night, no medications prior to arrival.  Patient denies any shortness of breath.  Patient does report a history of asthma, reports that she does not use a daily inhaler.  Past Medical History:  Diagnosis Date  . Arthritis   . Asthma     There are no active problems to display for this patient.   Past Surgical History:  Procedure Laterality Date  . ADENOIDECTOMY    . TONSILLECTOMY    . WISDOM TOOTH EXTRACTION      OB History    Gravida Para Term Preterm AB Living   0 0 0 0 0 0   SAB TAB Ectopic Multiple Live Births   0 0 0 0 0       Home Medications    Prior to Admission medications   Medication Sig Start Date End Date Taking? Authorizing Provider  albuterol (PROVENTIL HFA;VENTOLIN HFA) 108 (90 Base) MCG/ACT inhaler Inhale 1-2 puffs into the lungs every 6 (six) hours as needed for wheezing or shortness of breath. 12/20/16   Waris Rodger, Tinnie GensJeffrey, Misty Sanders  benzonatate (TESSALON) 100 MG capsule Take 1 capsule (100 mg total) by mouth every 8 (eight) hours. 12/20/16   Misty MechanicHedges, Saidee Geremia, Misty Sanders    Family History Family History  Problem Relation Age of Onset  . Diabetes Mother   . Hypertension Mother   . Hyperlipidemia Mother     Social History Social History  Substance Use Topics  . Smoking status: Former Smoker    Packs/day: 0.25    Types: Cigarettes  . Smokeless tobacco: Never  Used  . Alcohol use No     Allergies   Penicillins; Peanuts [peanut oil]; Sulfa antibiotics; and Latex   Review of Systems Review of Systems  All other systems reviewed and are negative.    Physical Exam Updated Vital Signs BP 116/85   Pulse 84   Temp 99.7 F (37.6 C) (Oral)   Resp 18   LMP 12/16/2016 (Exact Date)   SpO2 100%   Physical Exam  Constitutional: She is oriented to person, place, and time. She appears well-developed and well-nourished.  HENT:  Head: Normocephalic and atraumatic.  Mouth/Throat: Uvula is midline and oropharynx is clear and moist. No oropharyngeal exudate, posterior oropharyngeal edema, posterior oropharyngeal erythema or tonsillar abscesses.  Eyes: Pupils are equal, round, and reactive to light. Conjunctivae are normal. Right eye exhibits no discharge. Left eye exhibits no discharge. No scleral icterus.  Neck: Normal range of motion. No JVD present. No tracheal deviation present.  Pulmonary/Chest: Effort normal and breath sounds normal. No stridor. No respiratory distress. She has no wheezes. She has no rales. She exhibits no tenderness.  Neurological: She is alert and oriented to person, place, and time. Coordination normal.  Psychiatric: She has a normal mood and affect. Her behavior is normal. Judgment and thought content normal.  Nursing note and vitals reviewed.    ED  Treatments / Results  Labs (all labs ordered are listed, but only abnormal results are displayed) Labs Reviewed - No data to display  EKG  EKG Interpretation None       Radiology No results found.  Procedures Procedures (including critical care time)  Medications Ordered in ED Medications - No data to display   Initial Impression / Assessment and Plan / ED Course  I have reviewed the triage vital signs and the nursing notes.  Pertinent labs & imaging results that were available during my care of the patient were reviewed by me and considered in my medical  decision making (see chart for details).     Final Clinical Impressions(s) / ED Diagnoses   Final diagnoses:  Upper respiratory tract infection, unspecified type    Labs:   Imaging:  Consults:  Therapeutics:  Discharge Meds: Albuterol, Tessalon Perles  Assessment/Plan: 21 year old female presents today with likely viral URI.  Low suspicion for influenza in this patient giving slow onset, afebrile and well appearance.  Patient has clear lung sounds here, low suspicion for acute bacterial infection.  Patient will be discharged home with cough medication, strict return precautions for any new or worsening signs or symptoms.  She will be given a refill of her albuterol in the event she does need it, no need for breathing treatment now.  Patient verbalized understanding and agree concerns at time discharge.     New Prescriptions Discharge Medication List as of 12/20/2016  7:55 AM    START taking these medications   Details  benzonatate (TESSALON) 100 MG capsule Take 1 capsule (100 mg total) by mouth every 8 (eight) hours., Starting Fri 12/20/2016, Print         Misty Sanders, Misty Sanders, Misty Sanders 12/20/16 0756    Misty Mechanic, Misty Sanders 12/20/16 1005    Palumbo, April, MD 12/21/16 1610

## 2016-12-20 NOTE — Discharge Instructions (Signed)
Please read attached information. If you experience any new or worsening signs or symptoms please return to the emergency room for evaluation. Please follow-up with your primary care provider or specialist as discussed. Please use medication prescribed only as directed and discontinue taking if you have any concerning signs or symptoms.   °

## 2016-12-20 NOTE — ED Triage Notes (Signed)
Patient here with fever, cough, runny nose and achyness.  Patient thinks she has the flu.  States that it has been going on for 2 days.  She took mucinex yesterday.

## 2016-12-20 NOTE — ED Notes (Signed)
ED Provider at bedside. 

## 2016-12-20 NOTE — ED Notes (Signed)
Patient verbalizes understanding of discharge instructions. Opportunity for questioning and answers were provided. 

## 2017-02-07 ENCOUNTER — Encounter (HOSPITAL_COMMUNITY): Payer: Self-pay

## 2017-02-07 ENCOUNTER — Emergency Department (HOSPITAL_COMMUNITY)
Admission: EM | Admit: 2017-02-07 | Discharge: 2017-02-07 | Disposition: A | Discharge status: Home / Self Care | Payer: Medicare Other | Attending: Emergency Medicine | Admitting: Emergency Medicine

## 2017-02-07 ENCOUNTER — Other Ambulatory Visit: Payer: Self-pay

## 2017-02-07 ENCOUNTER — Emergency Department (HOSPITAL_COMMUNITY): Payer: Medicare Other

## 2017-02-07 DIAGNOSIS — Y9389 Activity, other specified: Secondary | ICD-10-CM | POA: Insufficient documentation

## 2017-02-07 DIAGNOSIS — M542 Cervicalgia: Secondary | ICD-10-CM | POA: Diagnosis not present

## 2017-02-07 DIAGNOSIS — J45909 Unspecified asthma, uncomplicated: Secondary | ICD-10-CM | POA: Diagnosis not present

## 2017-02-07 DIAGNOSIS — M7918 Myalgia, other site: Secondary | ICD-10-CM | POA: Diagnosis not present

## 2017-02-07 DIAGNOSIS — S0990XA Unspecified injury of head, initial encounter: Secondary | ICD-10-CM | POA: Diagnosis present

## 2017-02-07 DIAGNOSIS — Z91040 Latex allergy status: Secondary | ICD-10-CM | POA: Insufficient documentation

## 2017-02-07 DIAGNOSIS — M546 Pain in thoracic spine: Secondary | ICD-10-CM

## 2017-02-07 DIAGNOSIS — Y998 Other external cause status: Secondary | ICD-10-CM | POA: Insufficient documentation

## 2017-02-07 DIAGNOSIS — Z87891 Personal history of nicotine dependence: Secondary | ICD-10-CM | POA: Insufficient documentation

## 2017-02-07 DIAGNOSIS — Z91010 Allergy to peanuts: Secondary | ICD-10-CM | POA: Insufficient documentation

## 2017-02-07 DIAGNOSIS — Y929 Unspecified place or not applicable: Secondary | ICD-10-CM | POA: Insufficient documentation

## 2017-02-07 MED ORDER — ACETAMINOPHEN 325 MG PO TABS
650.0000 mg | ORAL_TABLET | Freq: Once | ORAL | Status: AC
  Administered 2017-02-07: 650 mg via ORAL
  Filled 2017-02-07: qty 2

## 2017-02-07 MED ORDER — METHOCARBAMOL 750 MG PO TABS: 750.0000 mg | ORAL_TABLET | Freq: Four times a day (QID) | ORAL | 0 refills | Status: DC

## 2017-02-07 MED ORDER — KETOROLAC TROMETHAMINE 60 MG/2ML IM SOLN
60.0000 mg | Freq: Once | INTRAMUSCULAR | Status: AC
  Administered 2017-02-07: 60 mg via INTRAMUSCULAR
  Filled 2017-02-07: qty 2

## 2017-02-07 MED ORDER — TRAMADOL HCL 50 MG PO TABS
50.0000 mg | ORAL_TABLET | Freq: Once | ORAL | Status: AC
  Administered 2017-02-07: 50 mg via ORAL
  Filled 2017-02-07: qty 1

## 2017-02-07 MED ORDER — IBUPROFEN 400 MG PO TABS: 400.0000 mg | ORAL_TABLET | Freq: Four times a day (QID) | ORAL | 0 refills | Status: DC | PRN
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
## 2017-02-07 NOTE — ED Notes (Signed)
Patient transported to X-ray 
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
## 2017-02-07 NOTE — Discharge Instructions (Signed)
Your images were okay no signs of broken bones or bleeding.  This is likely musculoskeletal pain.  Continue using her anti-inflammatories and muscle relaxers at home.  Follow-up with a primary care doctor.  Return to the ED with any worsening symptoms.
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
## 2017-02-07 NOTE — ED Notes (Signed)
ED Provider at bedside. 

## 2017-02-07 NOTE — ED Notes (Signed)
Pt departed in NAD, refused use of wheelchair.  
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
## 2018-10-20 ENCOUNTER — Other Ambulatory Visit: Payer: Self-pay

## 2018-10-20 ENCOUNTER — Encounter (HOSPITAL_COMMUNITY): Payer: Self-pay

## 2018-10-20 ENCOUNTER — Emergency Department (HOSPITAL_COMMUNITY)
Admission: EM | Admit: 2018-10-20 | Discharge: 2018-10-20 | Disposition: A | Discharge status: Home / Self Care | Payer: Medicare Other | Attending: Emergency Medicine | Admitting: Emergency Medicine

## 2018-10-20 DIAGNOSIS — J45909 Unspecified asthma, uncomplicated: Secondary | ICD-10-CM | POA: Insufficient documentation

## 2018-10-20 DIAGNOSIS — T7840XA Allergy, unspecified, initial encounter: Secondary | ICD-10-CM | POA: Insufficient documentation

## 2018-10-20 DIAGNOSIS — R05 Cough: Secondary | ICD-10-CM | POA: Insufficient documentation

## 2018-10-20 DIAGNOSIS — R059 Cough, unspecified: Secondary | ICD-10-CM

## 2018-10-20 DIAGNOSIS — Z87891 Personal history of nicotine dependence: Secondary | ICD-10-CM | POA: Insufficient documentation

## 2018-10-20 DIAGNOSIS — B353 Tinea pedis: Secondary | ICD-10-CM | POA: Insufficient documentation

## 2018-10-20 MED ORDER — LORATADINE 10 MG PO TABS: 10.0000 mg | ORAL_TABLET | Freq: Every day | ORAL | 0 refills | Status: DC

## 2018-10-20 MED ORDER — ALBUTEROL SULFATE HFA 108 (90 BASE) MCG/ACT IN AERS: 1.0000 | INHALATION_SPRAY | Freq: Four times a day (QID) | RESPIRATORY_TRACT | 1 refills | Status: DC | PRN

## 2018-10-20 MED ORDER — TOLNAFTATE 1 % EX POWD: 1.0000 "application " | Freq: Two times a day (BID) | CUTANEOUS | 3 refills | Status: DC

## 2018-10-20 NOTE — ED Provider Notes (Signed)
[facility] Emergency Department Provider Note MRN:  [id number]  Arrival date & time: 10/20/18     Chief Complaint   Cough and Asthma   History of Present Illness   Misty Sanders is a 23 y.o. year-old female with a history of asthma presenting to the ED with chief complaint of cough and asthma.  Woke up this morning with a chest heaviness, more noticeable with coughing.  She explains that she frequently experiences this during seasonal change due to allergies.  She denies chest pain, no shortness of breath, no fever, no body aches, no sick contacts.  No leg pain or swelling, no birth control pills, no personal history of DVT.  Review of Systems  A complete 10 system review of systems was obtained and all systems are negative except as noted in the HPI and PMH.   Patient's Health History    Past Medical History:  Diagnosis Date  . Arthritis   . Asthma     Past Surgical History:  Procedure Laterality Date  . ADENOIDECTOMY    . TONSILLECTOMY    . WISDOM TOOTH EXTRACTION      Family History  Problem Relation Age of Onset  . Diabetes Mother   . Hypertension Mother   . Hyperlipidemia Mother     Social History   Socioeconomic History  . Marital status: Single    Spouse name: Not on file  . Number of children: Not on file  . Years of education: Not on file  . Highest education level: Not on file  Occupational History  . Not on file  Social Needs  . Financial resource strain: Not on file  . Food insecurity    Worry: Not on file    Inability: Not on file  . Transportation needs    Medical: Not on file    Non-medical: Not on file  Tobacco Use  . Smoking status: Former Smoker    Packs/day: 0.25    Types: Cigarettes  . Smokeless tobacco: Never Used  Substance and Sexual Activity  . Alcohol use: No  . Drug use: Yes    Types: Marijuana    Comment: denies any use currently  . Sexual activity: Yes    Birth control/protection: None   Comment: last sex 23 Dec 2015  Lifestyle  . Physical activity    Days per week: Not on file    Minutes per session: Not on file  . Stress: Not on file  Relationships  . Social Herbalist on phone: Not on file    Gets together: Not on file    Attends religious service: Not on file    Active member of club or organization: Not on file    Attends meetings of clubs or organizations: Not on file    Relationship status: Not on file  . Intimate partner violence    Fear of current or ex partner: Not on file    Emotionally abused: Not on file    Physically abused: Not on file    Forced sexual activity: Not on file  Other Topics Concern  . Not on file  Social History Narrative  . Not on file     Physical Exam  Vital Signs and Nursing Notes reviewed Vitals:   10/20/18 1134 10/20/18 1200  BP: 114/68 100/62  Pulse: (!) 58 (!) 58  Resp: 16 16  Temp:    SpO2: 100% 98%    CONSTITUTIONAL: Well-appearing, NAD NEURO:  Alert  and oriented x 3, no focal deficits EYES:  eyes equal and reactive ENT/NECK:  no LAD, no JVD CARDIO: Regular rate, well-perfused, normal S1 and S2 PULM:  CTAB no wheezing or rhonchi GI/GU:  normal bowel sounds, non-distended, non-tender MSK/SPINE:  No gross deformities, no edema SKIN:  no rash, atraumatic PSYCH:  Appropriate speech and behavior  Diagnostic and Interventional Summary    EKG Interpretation  Date/Time:  Tuesday October 20 2018 10:12:36 EDT Ventricular Rate:  63 PR Interval:    QRS Duration: 77 QT Interval:  378 QTC Calculation: 387 R Axis:   78 Text Interpretation:  Sinus rhythm Confirmed by Kennis CarinaBero, Agusta Hackenberg [phone]) on 10/20/2018 12:17:03 PM      Labs Reviewed - No data to display  No orders to display    Medications - No data to display   Procedures Critical Care  ED Course and Medical Decision Making  I have reviewed the triage vital signs and the nursing notes.  Pertinent labs & imaging results that were available during  my care of the patient were reviewed by me and considered in my medical decision making (see below for details).  PERC negative, lungs are clear, EKG is without acute concerns.  Chest discomfort is in the setting of cough, favored to be due to allergies.  No indication for imaging today.  Appropriate for discharge with symptomatic control.  Patient advised to keep an eye out for more infectious symptoms and if present to begin home quarantine.  Elmer SowMichael M. Pilar PlateBero, MD [facility] [email or web address]  Final Clinical Impressions(s) / ED Diagnoses     ICD-10-CM   1. Cough  R05   2. Allergic state, initial encounter  T78.40XA   3. Tinea pedis of both feet  B35.3     ED Discharge Orders         Ordered    albuterol (VENTOLIN HFA) 108 (90 Base) MCG/ACT inhaler  Every 6 hours PRN     10/20/18 1226    loratadine (CLARITIN) 10 MG tablet  Daily     10/20/18 1226    tolnaftate (TINACTIN) 1 % powder  2 times daily     10/20/18 1226          Discharge Instructions Discussed with and Provided to Patient:   Discharge Instructions     You were evaluated in the Emergency Department and after careful evaluation, we did not find any emergent condition requiring admission or further testing in the hospital.  Your symptoms today seem to be due to allergies, which can sometimes flareup your asthma.  Please use the albuterol as needed at home.  You can also use the Claritin medication to help control your allergies.  We have also provided you with a powder for your athlete's foot.  If your cough worsens or you begin experiencing fever or body aches, this would be more concerning for a virus we would encourage you to stay home to avoid spreading the virus to others.  Please return to the Emergency Department if you experience any worsening of your condition.  We encourage you to follow up with a primary care provider.  Thank you for allowing us to be a part  of your care.       Sabas SousBero, Sandee Bernath M, MD 10/20/18 1229

## 2018-10-20 NOTE — ED Triage Notes (Signed)
Patient reports dry cough X1 week.   Hx. Asthma Patient reports waking up this morning with shob.    No distress noted by this RN in triage.    A/Ox4 Ambulatory in triage.

## 2018-10-20 NOTE — Discharge Instructions (Addendum)
You were evaluated in the Emergency Department and after careful evaluation, we did not find any emergent condition requiring admission or further testing in the hospital.  Your symptoms today seem to be due to allergies, which can sometimes flareup your asthma.  Please use the albuterol as needed at home.  You can also use the Claritin medication to help control your allergies.  We have also provided you with a powder for your athlete's foot.  If your cough worsens or you begin experiencing fever or body aches, this would be more concerning for a virus we would encourage you to stay home to avoid spreading the virus to others.  Please return to the Emergency Department if you experience any worsening of your condition.  We encourage you to follow up with a primary care provider.  Thank you for allowing [place] to be a part of your care.

## 2018-10-20 NOTE — ED Notes (Signed)
Mother at bedside.

## 2018-11-02 ENCOUNTER — Emergency Department (HOSPITAL_COMMUNITY): Payer: Self-pay

## 2018-11-02 ENCOUNTER — Emergency Department (HOSPITAL_COMMUNITY)
Admission: EM | Admit: 2018-11-02 | Discharge: 2018-11-02 | Disposition: A | Discharge status: Home / Self Care | Payer: Self-pay | Attending: Emergency Medicine | Admitting: Emergency Medicine

## 2018-11-02 ENCOUNTER — Other Ambulatory Visit: Payer: Self-pay

## 2018-11-02 ENCOUNTER — Encounter (HOSPITAL_COMMUNITY): Payer: Self-pay

## 2018-11-02 DIAGNOSIS — Z79899 Other long term (current) drug therapy: Secondary | ICD-10-CM | POA: Insufficient documentation

## 2018-11-02 DIAGNOSIS — Z87891 Personal history of nicotine dependence: Secondary | ICD-10-CM | POA: Insufficient documentation

## 2018-11-02 DIAGNOSIS — J45909 Unspecified asthma, uncomplicated: Secondary | ICD-10-CM | POA: Insufficient documentation

## 2018-11-02 DIAGNOSIS — Z91040 Latex allergy status: Secondary | ICD-10-CM | POA: Insufficient documentation

## 2018-11-02 DIAGNOSIS — Z20828 Contact with and (suspected) exposure to other viral communicable diseases: Secondary | ICD-10-CM | POA: Insufficient documentation

## 2018-11-02 DIAGNOSIS — Z91010 Allergy to peanuts: Secondary | ICD-10-CM | POA: Insufficient documentation

## 2018-11-02 DIAGNOSIS — R0789 Other chest pain: Secondary | ICD-10-CM | POA: Insufficient documentation

## 2018-11-02 DIAGNOSIS — R059 Cough, unspecified: Secondary | ICD-10-CM

## 2018-11-02 DIAGNOSIS — R05 Cough: Secondary | ICD-10-CM | POA: Insufficient documentation

## 2018-11-02 MED ORDER — GUAIFENESIN-CODEINE 100-10 MG/5ML PO SOLN
10.0000 mL | Freq: Once | ORAL | Status: AC
  Administered 2018-11-02: 10 mL via ORAL
  Filled 2018-11-02: qty 10

## 2018-11-02 MED ORDER — GUAIFENESIN 100 MG/5ML PO LIQD: 100.0000 mg | ORAL | 0 refills | Status: DC | PRN

## 2018-11-02 MED ORDER — PREDNISONE 20 MG PO TABS: 60.0000 mg | ORAL_TABLET | Freq: Every day | ORAL | 0 refills | Status: DC

## 2018-11-02 MED ORDER — IBUPROFEN 800 MG PO TABS
800.0000 mg | ORAL_TABLET | Freq: Once | ORAL | Status: AC
  Administered 2018-11-02: 02:00:00 800 mg via ORAL
  Filled 2018-11-02: qty 1

## 2018-11-02 MED ORDER — ALBUTEROL SULFATE HFA 108 (90 BASE) MCG/ACT IN AERS
4.0000 | INHALATION_SPRAY | Freq: Once | RESPIRATORY_TRACT | Status: AC
  Administered 2018-11-02: 02:00:00 4 via RESPIRATORY_TRACT
  Filled 2018-11-02: qty 6.7

## 2018-11-02 MED ORDER — PREDNISONE 20 MG PO TABS
60.0000 mg | ORAL_TABLET | Freq: Once | ORAL | Status: AC
  Administered 2018-11-02: 02:00:00 60 mg via ORAL
  Filled 2018-11-02: qty 3

## 2018-11-02 NOTE — ED Provider Notes (Signed)
TIME SEEN: 1:29 AM  CHIEF COMPLAINT: Cough  HPI: Patient is a 23 year old female with history of asthma who presents to the emergency department with dry cough for the past day.  States she is coughing so hard it is causing chest pain.  Was seen here on 10/20/2018 for the same.  Given albuterol inhaler which she has been using at home without relief.  She states she has been intermittently wheezing.  No fevers or chills.  No known COVID exposures.  No travel.  ROS: See HPI Constitutional: no fever  Eyes: no drainage  ENT: no runny nose   Cardiovascular:   chest pain  Resp:  SOB  GI: no vomiting GU: no dysuria Integumentary: no rash  Allergy: no hives  Musculoskeletal: no leg swelling  Neurological: no slurred speech ROS otherwise negative  PAST MEDICAL HISTORY/PAST SURGICAL HISTORY:  Past Medical History:  Diagnosis Date  . Arthritis   . Asthma     MEDICATIONS:  Prior to Admission medications   Medication Sig Start Date End Date Taking? Authorizing Provider  albuterol (VENTOLIN HFA) 108 (90 Base) MCG/ACT inhaler Inhale 1-2 puffs into the lungs every 6 (six) hours as needed for wheezing or shortness of breath. 10/20/18   Maudie Flakes, MD  benzonatate (TESSALON) 100 MG capsule Take 1 capsule (100 mg total) by mouth every 8 (eight) hours. Patient not taking: Reported on 02/07/2017 12/20/16   Hedges, Dellis Filbert, PA-C  ibuprofen (ADVIL,MOTRIN) 400 MG tablet Take 1 tablet (400 mg total) by mouth every 6 (six) hours as needed. Patient not taking: Reported on 10/20/2018 02/07/17   Lacretia Leigh, MD  loratadine (CLARITIN) 10 MG tablet Take 1 tablet (10 mg total) by mouth daily. 10/20/18   Maudie Flakes, MD  methocarbamol (ROBAXIN-750) 750 MG tablet Take 1 tablet (750 mg total) by mouth 4 (four) times daily. Patient not taking: Reported on 10/20/2018 02/07/17   Lacretia Leigh, MD  tolnaftate (TINACTIN) 1 % powder Apply 1 application topically 2 (two) times daily. 10/20/18   Maudie Flakes,  MD    ALLERGIES:  Allergies  Allergen Reactions  . Penicillins Anaphylaxis and Other (See Comments)    Has patient had a PCN reaction causing immediate rash, facial/tongue/throat swelling, SOB or lightheadedness with hypotension: Yes Has patient had a PCN reaction causing severe rash involving mucus membranes or skin necrosis: No Has patient had a PCN reaction that required hospitalization: No Has patient had a PCN reaction occurring within the last 10 years: No If all of the above answers are "NO", then may proceed with Cephalosporin use.  . Eggs Or Egg-Derived Products Itching and Swelling  . Peanuts [Peanut Oil] Hives  . Sulfa Antibiotics Hives  . Latex Rash    SOCIAL HISTORY:  Social History   Tobacco Use  . Smoking status: Former Smoker    Packs/day: 0.25    Types: Cigarettes  . Smokeless tobacco: Never Used  Substance Use Topics  . Alcohol use: No    FAMILY HISTORY: Family History  Problem Relation Age of Onset  . Diabetes Mother   . Hypertension Mother   . Hyperlipidemia Mother     EXAM: BP 112/70 (BP Location: Left Arm)   Pulse 80   Temp 98.5 F (36.9 C) (Oral)   Resp 18   Ht 5\' 6"  (1.676 m)   Wt 65.8 kg   SpO2 94%   BMI 23.40 kg/m  CONSTITUTIONAL: Alert and oriented and responds appropriately to questions.  Appears uncomfortable with coughing.  Has dry nonproductive cough on exam HEAD: Normocephalic EYES: Conjunctivae clear, pupils appear equal, EOMI ENT: normal nose; moist mucous membranes NECK: Supple, no meningismus, no nuchal rigidity, no LAD  CARD: RRR; S1 and S2 appreciated; no murmurs, no clicks, no rubs, no gallops RESP: Normal chest excursion without splinting or tachypnea; breath sounds clear and equal bilaterally; no wheezes, no rhonchi, no rales, no hypoxia or respiratory distress, speaking full sentences ABD/GI: Normal bowel sounds; non-distended; soft, non-tender, no rebound, no guarding, no peritoneal signs, no hepatosplenomegaly BACK:   The back appears normal and is non-tender to palpation, there is no CVA tenderness EXT: Normal ROM in all joints; non-tender to palpation; no edema; normal capillary refill; no cyanosis, no calf tenderness or swelling    SKIN: Normal color for age and race; warm; no rash NEURO: Moves all extremities equally PSYCH: The patient's mood and manner are appropriate. Grooming and personal hygiene are appropriate.  MEDICAL DECISION MAKING: Patient here with complaints of cough.  May be related to her asthma but will test for COVID.  Will obtain chest x-ray, EKG.  Low suspicion for ACS, PE or dissection.  She is PERC negative.  Will treat symptomatically with ibuprofen, guaifenesin with codeine, albuterol, prednisone.  ED PROGRESS: EKG shows no ischemic abnormality.  Chest x-ray is clear.  2:45 AM  Pt reports feeling much better.  She is no longer coughing.  I feel she is safe to be discharged home.  At this time, I do not feel there is any life-threatening condition present. I have reviewed and discussed all results (EKG, imaging, lab, urine as appropriate) and exam findings with patient/family. I have reviewed nursing notes and appropriate previous records.  I feel the patient is safe to be discharged home without further emergent workup and can continue workup as an outpatient as needed. Discussed usual and customary return precautions. Patient/family verbalize understanding and are comfortable with this plan.  Outpatient follow-up has been provided as needed. All questions have been answered.  Misty Sanders was evaluated in Emergency Department on 11/02/2018 for the symptoms described in the history of present illness. She was evaluated in the context of the global COVID-19 pandemic, which necessitated consideration that the patient might be at risk for infection with the SARS-CoV-2 virus that causes COVID-19. Institutional protocols and algorithms that pertain to the evaluation of patients at risk for  COVID-19 are in a state of rapid change based on information released by regulatory bodies including the CDC and federal and state organizations. These policies and algorithms were followed during the patient's care in the ED.    EKG Interpretation  Date/Time:  Monday November 02 2018 01:52:13 EDT Ventricular Rate:  65 PR Interval:    QRS Duration: 70 QT Interval:  382 QTC Calculation: 398 R Axis:   72 Text Interpretation:  Sinus rhythm No significant change since last tracing Confirmed by Joffre Lucks, Baxter HireKristen [phone]([id number]) on 11/02/2018 2:05:49 AM          Cyndie Woodbeck, Layla MawKristen N, DO 11/02/18 [id number]

## 2018-11-02 NOTE — Discharge Instructions (Addendum)
You may alternate Tylenol 1000 mg every 6 hours as needed for pain and Ibuprofen 800 mg every 8 hours as needed for pain.  Please take Ibuprofen with food.  You may use her albuterol inhaler 2 to 4 puffs every 2-4 hours as needed for shortness of breath and wheezing.  We have swabbed you for COVID.  These results will be back in the next 24 to 48 hours.  You may follow-up on your results in my chart.  If your test is positive, you will need to be out of work for at least 10 days after the onset of symptoms and be symptom-free for 3 days before returning to work.   Steps to find a Primary Care Provider (PCP):  Call [phone] or [phone] to access "[facility]."  2.  You may also go on the [facility] website at [email or web address]  3.  [facility] also frequently accepts new patients.  [place] [phone]  4.  There are also multiple [facility], [facility] and [facility] practices throughout the [place] that are frequently accepting new patients. You may find a clinic that is close to your home and contact them.  [facility] [email or web address] [phone]  [facility] [email or web address]  [facility] [email or web address] [place] [email or web address] [phone]  5.  Local Health Departments also can provide primary care services.  [facility]  [place] [address] [phone]  [facility] [place] [address] [place] [facility] [place] [phone]

## 2018-11-02 NOTE — ED Triage Notes (Signed)
Pt reports dry cough starting tonight. Denies fever. She reports a hx of asthma.

## 2018-11-03 LAB — NOVEL CORONAVIRUS, NAA (HOSP ORDER, SEND-OUT TO REF LAB; TAT 18-24 HRS): SARS-CoV-2, NAA: NOT DETECTED

## 2019-02-05 ENCOUNTER — Other Ambulatory Visit: Payer: Self-pay

## 2019-02-05 ENCOUNTER — Encounter (HOSPITAL_COMMUNITY): Payer: Self-pay

## 2019-02-05 ENCOUNTER — Emergency Department (HOSPITAL_COMMUNITY)
Admission: EM | Admit: 2019-02-05 | Discharge: 2019-02-05 | Discharge status: Against Medical Advice | Payer: Medicaid Other | Attending: Emergency Medicine | Admitting: Emergency Medicine

## 2019-02-05 DIAGNOSIS — Z5321 Procedure and treatment not carried out due to patient leaving prior to being seen by health care provider: Secondary | ICD-10-CM | POA: Insufficient documentation

## 2019-02-05 NOTE — ED Triage Notes (Signed)
Pt reports itchy hives in her groin and on her buttocks. She states that she wore nylon panties 2 days ago and the hives have been there since. States that she has been treating at home with benadryl without improvement.

## 2019-02-05 NOTE — ED Notes (Signed)
Pt told registration that they were leaving.  °

## 2019-02-08 ENCOUNTER — Other Ambulatory Visit: Payer: Self-pay

## 2019-02-08 ENCOUNTER — Emergency Department (HOSPITAL_COMMUNITY)
Admission: EM | Admit: 2019-02-08 | Discharge: 2019-02-08 | Disposition: A | Discharge status: Home / Self Care | Payer: Medicaid Other | Attending: Emergency Medicine | Admitting: Emergency Medicine

## 2019-02-08 ENCOUNTER — Encounter (HOSPITAL_COMMUNITY): Payer: Self-pay

## 2019-02-08 DIAGNOSIS — R21 Rash and other nonspecific skin eruption: Secondary | ICD-10-CM | POA: Insufficient documentation

## 2019-02-08 DIAGNOSIS — Z91040 Latex allergy status: Secondary | ICD-10-CM | POA: Insufficient documentation

## 2019-02-08 DIAGNOSIS — Z87891 Personal history of nicotine dependence: Secondary | ICD-10-CM | POA: Insufficient documentation

## 2019-02-08 DIAGNOSIS — J45909 Unspecified asthma, uncomplicated: Secondary | ICD-10-CM | POA: Insufficient documentation

## 2019-02-08 MED ORDER — HYDROXYZINE HCL 10 MG PO TABS: 10.0000 mg | ORAL_TABLET | Freq: Four times a day (QID) | ORAL | 0 refills | Status: DC | PRN

## 2019-02-08 MED ORDER — HYDROXYZINE HCL 10 MG PO TABS
10.0000 mg | ORAL_TABLET | Freq: Once | ORAL | Status: AC
  Administered 2019-02-08: 06:00:00 10 mg via ORAL
  Filled 2019-02-08: qty 1

## 2019-02-08 MED ORDER — CLINDAMYCIN HCL 150 MG PO CAPS: 300.0000 mg | ORAL_CAPSULE | Freq: Three times a day (TID) | ORAL | 0 refills | Status: DC

## 2019-02-08 MED ORDER — CLINDAMYCIN HCL 300 MG PO CAPS
300.0000 mg | ORAL_CAPSULE | Freq: Once | ORAL | Status: AC
  Administered 2019-02-08: 300 mg via ORAL
  Filled 2019-02-08: qty 1

## 2019-02-08 NOTE — Discharge Instructions (Signed)
Recommend using a gentle soap like dove, dial, ivory, etc. Do not shave genital region again until this completely clears up. Take the prescribed medication as directed.  Warm compresses may help as well. Follow-up with women's clinic if any ongoing issues. Return to the ED for new or worsening symptoms.

## 2019-02-08 NOTE — ED Triage Notes (Signed)
Patient arrived stating that she has had an allergic reaction to Nylon panties a few days ago but did not get seen due to wait times. Taking over the counter medication with no relief. Reports itching and rash

## 2019-02-08 NOTE — ED Provider Notes (Signed)
[facility] DEPT Provider Note   CSN: [id number] Arrival date & time: 02/08/19  0458     History Chief Complaint  Patient presents with  . Allergic Reaction    Misty Sanders is a 23 y.o. female.  The history is provided by the patient and medical records.  Allergic Reaction Presenting symptoms: rash     23 y.o. F with hx of arthritis, asthma, presenting to the ED for allergic reaction.  Patient reports a few days ago she wore nylon panties with glitter on them and developed a rash in her genital area.  States that night she started itching so took them off and showered but used a different shower gel than normal-- very scented and fragrant (usually uses dove, dial, etc).  States rash got a little worse so she decided to shave and after that she developed small pustules all over her genital region.  States she has a history of the same in the past that was treated with antibiotics.  She does continue to feel some itching/burning of the genital region.  Denies vaginal discharge, pelvic pain, new sexual partner, urinary symptoms, or concern for STD.  She did try taking OTC benadryl without relief.  Past Medical History:  Diagnosis Date  . Arthritis   . Asthma     There are no problems to display for this patient.   Past Surgical History:  Procedure Laterality Date  . ADENOIDECTOMY    . TONSILLECTOMY    . WISDOM TOOTH EXTRACTION       OB History    Gravida  0   Para  0   Term  0   Preterm  0   AB  0   Living  0     SAB  0   TAB  0   Ectopic  0   Multiple  0   Live Births  0           Family History  Problem Relation Age of Onset  . Diabetes Mother   . Hypertension Mother   . Hyperlipidemia Mother     Social History   Tobacco Use  . Smoking status: Former Smoker    Packs/day: 0.25    Types: Cigarettes  . Smokeless tobacco: Never Used  Substance Use Topics  . Alcohol use: No  . Drug use: Yes    Types:  Marijuana    Comment: denies any use currently    Home Medications Prior to Admission medications   Medication Sig Start Date End Date Taking? Authorizing Provider  albuterol (VENTOLIN HFA) 108 (90 Base) MCG/ACT inhaler Inhale 1-2 puffs into the lungs every 6 (six) hours as needed for wheezing or shortness of breath. 10/20/18  Yes Bero, Barth Kirks, MD  benzonatate (TESSALON) 100 MG capsule Take 1 capsule (100 mg total) by mouth every 8 (eight) hours. Patient not taking: Reported on 02/07/2017 12/20/16   Hedges, Dellis Filbert, PA-C  guaiFENesin (ROBITUSSIN) 100 MG/5ML liquid Take 5-10 mLs (100-200 mg total) by mouth every 4 (four) hours as needed for cough. Patient not taking: Reported on 02/08/2019 11/02/18   Ward, Delice Bison, DO  ibuprofen (ADVIL,MOTRIN) 400 MG tablet Take 1 tablet (400 mg total) by mouth every 6 (six) hours as needed. Patient not taking: Reported on 10/20/2018 02/07/17   Lacretia Leigh, MD  loratadine (CLARITIN) 10 MG tablet Take 1 tablet (10 mg total) by mouth daily. Patient not taking: Reported on 02/08/2019 10/20/18   Maudie Flakes, MD  methocarbamol (ROBAXIN-750) 750 MG tablet Take 1 tablet (750 mg total) by mouth 4 (four) times daily. Patient not taking: Reported on 10/20/2018 02/07/17   Lorre NickAllen, Anthony, MD  predniSONE (DELTASONE) 20 MG tablet Take 3 tablets (60 mg total) by mouth daily. Patient not taking: Reported on 02/08/2019 11/02/18   Ward, Layla MawKristen N, DO  tolnaftate (TINACTIN) 1 % powder Apply 1 application topically 2 (two) times daily. Patient not taking: Reported on 02/08/2019 10/20/18   Sabas SousBero, Michael M, MD    Allergies    Penicillins, Eggs or egg-derived products, Peanuts [peanut oil], Sulfa antibiotics, and Latex  Review of Systems   Review of Systems  Skin: Positive for rash.  All other systems reviewed and are negative.   Physical Exam Updated Vital Signs BP 105/67   Pulse 65   Temp 98 F (36.7 C) (Oral)   Resp 18   Ht 5\' 6"  (1.676 m)   Wt 65.8 kg   SpO2  97%   BMI 23.40 kg/m   Physical Exam Vitals and nursing note reviewed.  Constitutional:      Appearance: She is well-developed.  HENT:     Head: Normocephalic and atraumatic.  Eyes:     Conjunctiva/sclera: Conjunctivae normal.     Pupils: Pupils are equal, round, and reactive to light.  Cardiovascular:     Rate and Rhythm: Normal rate and regular rhythm.     Heart sounds: Normal heart sounds.  Pulmonary:     Effort: Pulmonary effort is normal.     Breath sounds: Normal breath sounds.  Abdominal:     General: Bowel sounds are normal.     Palpations: Abdomen is soft.  Genitourinary:    Comments: Genital area has recently been shaved, multiple, scattered pustules noted on the mons pubis without extension to the labia, no ulcerated or vesicular lesions, no lymphadenopathy Musculoskeletal:        General: Normal range of motion.     Cervical back: Normal range of motion.  Skin:    General: Skin is warm and dry.  Neurological:     Mental Status: She is alert and oriented to person, place, and time.     ED Results / Procedures / Treatments   Labs (all labs ordered are listed, but only abnormal results are displayed) Labs Reviewed - No data to display  EKG None  Radiology No results found.  Procedures Procedures (including critical care time)  Medications Ordered in ED Medications  clindamycin (CLEOCIN) capsule 300 mg (300 mg Oral Given 02/08/19 0608)  hydrOXYzine (ATARAX/VISTARIL) tablet 10 mg (10 mg Oral Given 02/08/19 0608)    ED Course  I have reviewed the triage vital signs and the nursing notes.  Pertinent labs & imaging results that were available during my care of the patient were reviewed by me and considered in my medical decision making (see chart for details).    MDM Rules/Calculators/A&P  23 y.o. F here with rash of genital region.  Worse after shaving, also used a new soap a few days ago.  She is afebrile and nontoxic.  On exam she has multiple,  scattered pustules of her mons pubis but no ulcerated or vesicular lesions.  No vaginal discharge, pelvic pain, etc.  No expressed concern for STD.  Symptoms and exam findings are concerning for folliculitis.  Rash does not appear consistent with HSV.  Will start on course of clindamycin, encouraged warm compresses.  Will follow-up with OB-GYN-- given information for women's clinic.  May return here  for any new/acute changes.  Final Clinical Impression(s) / ED Diagnoses Final diagnoses:  Rash    Rx / DC Orders ED Discharge Orders         Ordered    clindamycin (CLEOCIN) 150 MG capsule  3 times daily     02/08/19 0614    hydrOXYzine (ATARAX/VISTARIL) 10 MG tablet  Every 6 hours PRN     02/08/19 0614           Garlon Hatchet, PA-C 02/08/19 5916    Zadie Rhine, MD 02/08/19 2317

## 2019-05-18 ENCOUNTER — Emergency Department (HOSPITAL_COMMUNITY)
Admission: EM | Admit: 2019-05-18 | Discharge: 2019-05-18 | Discharge status: Absent without leave | Payer: Medicaid Other

## 2019-05-18 ENCOUNTER — Other Ambulatory Visit: Payer: Self-pay

## 2019-05-19 ENCOUNTER — Emergency Department (HOSPITAL_COMMUNITY)
Admission: EM | Admit: 2019-05-19 | Discharge: 2019-05-19 | Disposition: A | Discharge status: Home / Self Care | Payer: Self-pay | Attending: Emergency Medicine | Admitting: Emergency Medicine

## 2019-05-19 ENCOUNTER — Emergency Department (HOSPITAL_COMMUNITY): Payer: Self-pay

## 2019-05-19 DIAGNOSIS — R451 Restlessness and agitation: Secondary | ICD-10-CM | POA: Insufficient documentation

## 2019-05-19 DIAGNOSIS — Y939 Activity, unspecified: Secondary | ICD-10-CM | POA: Insufficient documentation

## 2019-05-19 DIAGNOSIS — Z87891 Personal history of nicotine dependence: Secondary | ICD-10-CM | POA: Insufficient documentation

## 2019-05-19 DIAGNOSIS — Y929 Unspecified place or not applicable: Secondary | ICD-10-CM | POA: Insufficient documentation

## 2019-05-19 DIAGNOSIS — Y999 Unspecified external cause status: Secondary | ICD-10-CM | POA: Insufficient documentation

## 2019-05-19 DIAGNOSIS — Z91040 Latex allergy status: Secondary | ICD-10-CM | POA: Insufficient documentation

## 2019-05-19 DIAGNOSIS — M546 Pain in thoracic spine: Secondary | ICD-10-CM

## 2019-05-19 DIAGNOSIS — M545 Low back pain, unspecified: Secondary | ICD-10-CM

## 2019-05-19 DIAGNOSIS — M5126 Other intervertebral disc displacement, lumbar region: Secondary | ICD-10-CM | POA: Insufficient documentation

## 2019-05-19 DIAGNOSIS — Z79899 Other long term (current) drug therapy: Secondary | ICD-10-CM | POA: Insufficient documentation

## 2019-05-19 DIAGNOSIS — J45909 Unspecified asthma, uncomplicated: Secondary | ICD-10-CM | POA: Insufficient documentation

## 2019-05-19 DIAGNOSIS — W19XXXA Unspecified fall, initial encounter: Secondary | ICD-10-CM | POA: Insufficient documentation

## 2019-05-19 DIAGNOSIS — Z91010 Allergy to peanuts: Secondary | ICD-10-CM | POA: Insufficient documentation

## 2019-05-19 DIAGNOSIS — M5136 Other intervertebral disc degeneration, lumbar region: Secondary | ICD-10-CM

## 2019-05-19 LAB — I-STAT BETA HCG BLOOD, ED (MC, WL, AP ONLY): I-stat hCG, quantitative: <5 m[IU]/mL (ref <5)

## 2019-05-19 MED ORDER — METHOCARBAMOL 500 MG PO TABS: 500.0000 mg | ORAL_TABLET | Freq: Two times a day (BID) | ORAL | 0 refills | Status: DC

## 2019-05-19 MED ORDER — PREDNISONE 20 MG PO TABS: 40.0000 mg | ORAL_TABLET | Freq: Every day | ORAL | 0 refills | Status: AC

## 2019-05-19 MED ORDER — NAPROXEN 500 MG PO TABS: 500.0000 mg | ORAL_TABLET | Freq: Two times a day (BID) | ORAL | 0 refills | Status: DC

## 2019-05-19 MED ORDER — HYDROCODONE-ACETAMINOPHEN 5-325 MG PO TABS
1.0000 | ORAL_TABLET | Freq: Once | ORAL | Status: AC
  Administered 2019-05-19: 1 via ORAL
  Filled 2019-05-19: qty 1

## 2019-05-19 MED ORDER — LIDOCAINE 5 % EX PTCH
2.0000 | MEDICATED_PATCH | CUTANEOUS | Status: DC
  Administered 2019-05-19: 2 via TRANSDERMAL
  Filled 2019-05-19: qty 2

## 2019-05-19 MED FILL — predniSONE 20 MG TABS: 20 | 5 days supply | Qty: 10 | Fill #0

## 2019-05-19 MED FILL — NAPROXEN 500 MG TABS: 500 | 15 days supply | Qty: 30 | Fill #0

## 2019-05-19 MED FILL — METHOCARBAMOL 500 MG TABS: 500 | 10 days supply | Qty: 20 | Fill #0

## 2019-05-19 NOTE — ED Provider Notes (Signed)
Called back to room after discharge patient.  Patient notes she does not have a ride home and was told by the nurse she cannot leave since she was given narcotics.  She also notes that she is still in significant amount of pain.  I offered IM Toradol for added pain relief and she declined. Patient was offered bus/cab voucher to leave or wait 30 more minutes to allow for 4 hours after medication was given; however, patient refused and walked out of the ER. Patient's gait was normal. She was ambulating without difficulty. Patient became extremely agitated and uncooperative.    Misty Sanders 05/19/19 1336    Charlynne Pander, MD 05/19/19 [phone]

## 2019-05-19 NOTE — ED Triage Notes (Signed)
Pt here for back pain that started four days ago.

## 2019-05-19 NOTE — ED Notes (Signed)
PA at bedside.

## 2019-05-19 NOTE — ED Provider Notes (Signed)
[facility] Provider Note   CSN: [id number] Arrival date & time: 05/19/19  [id number]     History No chief complaint on file.   Misty Sanders is a 24 y.o. female with a past medical history significant for arthritis and asthma who presents to the ED due to worsening mid and low back after falling directly on her back 5 days ago.  Pain is constant and worse with movement especially ambulation.  She admits to intermittent bilateral posterior lower extremity numbness/tingling.  She admits to chronic low back pain due to scoliosis, but notes this feels different than her normal back pain.  Denies IV drug use, history of cancer, fever/chills, urinary symptoms, abdominal pain, and saddle paresthesias.  She admits to occasional lower extremity weakness unclear if it is due to pain or not.  She has tried muscle relaxers and over-the-counter pain medication with no relief.  During the fall, patient denies head injury and loss of consciousness.  Denies bowel/bladder incontinence.  History obtained from patient and past medical records. No interpreter used during encounter.      Past Medical History:  Diagnosis Date  . Arthritis   . Asthma     There are no problems to display for this patient.   Past Surgical History:  Procedure Laterality Date  . ADENOIDECTOMY    . TONSILLECTOMY    . WISDOM TOOTH EXTRACTION       OB History    Gravida  0   Para  0   Term  0   Preterm  0   AB  0   Living  0     SAB  0   TAB  0   Ectopic  0   Multiple  0   Live Births  0           Family History  Problem Relation Age of Onset  . Diabetes Mother   . Hypertension Mother   . Hyperlipidemia Mother     Social History   Tobacco Use  . Smoking status: Former Smoker    Packs/day: 0.25    Types: Cigarettes  . Smokeless tobacco: Never Used  Substance Use Topics  . Alcohol use: No  . Drug use: Yes    Types: Marijuana    Comment: denies any  use currently    Home Medications Prior to Admission medications   Medication Sig Start Date End Date Taking? Authorizing Provider  albuterol (VENTOLIN HFA) 108 (90 Base) MCG/ACT inhaler Inhale 1-2 puffs into the lungs every 6 (six) hours as needed for wheezing or shortness of breath. 10/20/18   Sabas SousBero, Michael M, MD  benzonatate (TESSALON) 100 MG capsule Take 1 capsule (100 mg total) by mouth every 8 (eight) hours. Patient not taking: Reported on 02/07/2017 12/20/16   Hedges, Tinnie GensJeffrey, PA-C  clindamycin (CLEOCIN) 150 MG capsule Take 2 capsules (300 mg total) by mouth 3 (three) times daily. May dispense as 150mg  capsules 02/08/19   Garlon HatchetSanders, Lisa M, PA-C  guaiFENesin (ROBITUSSIN) 100 MG/5ML liquid Take 5-10 mLs (100-200 mg total) by mouth every 4 (four) hours as needed for cough. Patient not taking: Reported on 02/08/2019 11/02/18   Ward, Layla MawKristen N, DO  hydrOXYzine (ATARAX/VISTARIL) 10 MG tablet Take 1 tablet (10 mg total) by mouth every 6 (six) hours as needed for itching. 02/08/19   Garlon HatchetSanders, Lisa M, PA-C  ibuprofen (ADVIL,MOTRIN) 400 MG tablet Take 1 tablet (400 mg total) by mouth every 6 (six) hours as needed. Patient  not taking: Reported on 10/20/2018 02/07/17   Lacretia Leigh, MD  loratadine (CLARITIN) 10 MG tablet Take 1 tablet (10 mg total) by mouth daily. Patient not taking: Reported on 02/08/2019 10/20/18   Maudie Flakes, MD  methocarbamol (ROBAXIN) 500 MG tablet Take 1 tablet (500 mg total) by mouth 2 (two) times daily. 05/19/19   Suzy Bouchard, PA-C  methocarbamol (ROBAXIN-750) 750 MG tablet Take 1 tablet (750 mg total) by mouth 4 (four) times daily. Patient not taking: Reported on 10/20/2018 02/07/17   Lacretia Leigh, MD  naproxen (NAPROSYN) 500 MG tablet Take 1 tablet (500 mg total) by mouth 2 (two) times daily. 05/19/19   Suzy Bouchard, PA-C  predniSONE (DELTASONE) 20 MG tablet Take 3 tablets (60 mg total) by mouth daily. Patient not taking: Reported on 02/08/2019 11/02/18   Ward,  Delice Bison, DO  predniSONE (DELTASONE) 20 MG tablet Take 2 tablets (40 mg total) by mouth daily for 5 days. 05/19/19 05/24/19  Suzy Bouchard, PA-C  tolnaftate (TINACTIN) 1 % powder Apply 1 application topically 2 (two) times daily. Patient not taking: Reported on 02/08/2019 10/20/18   Maudie Flakes, MD    Allergies    Penicillins, Eggs or egg-derived products, Peanuts [peanut oil], Sulfa antibiotics, and Latex  Review of Systems   Review of Systems  Constitutional: Negative for chills and fever.  Gastrointestinal: Negative for abdominal pain, diarrhea, nausea and vomiting.  Genitourinary: Negative for dysuria and vaginal discharge.  Musculoskeletal: Positive for back pain and gait problem. Negative for neck pain.  Neurological: Positive for weakness.  All other systems reviewed and are negative.   Physical Exam Updated Vital Signs BP 132/89 (BP Location: Right Arm)   Pulse 89   Temp 97.8 F (36.6 C) (Oral)   Resp 20   SpO2 97%   Physical Exam Vitals and nursing note reviewed.  Constitutional:      General: She is not in acute distress.    Appearance: She is not toxic-appearing.     Comments: Appears to be very uncomfortable standing in the room leaning over the bedside table.  Unable to sit down due to extreme pain.  Tearful on exam  HENT:     Head: Normocephalic.  Eyes:     Pupils: Pupils are equal, round, and reactive to light.  Neck:     Comments: No cervical midline tenderness. Cardiovascular:     Rate and Rhythm: Normal rate and regular rhythm.     Pulses: Normal pulses.     Heart sounds: Normal heart sounds. No murmur. No friction rub. No gallop.   Pulmonary:     Effort: Pulmonary effort is normal.     Breath sounds: Normal breath sounds.  Abdominal:     General: Abdomen is flat. Bowel sounds are normal. There is no distension.     Palpations: Abdomen is soft.     Tenderness: There is no abdominal tenderness. There is no guarding or rebound.    Musculoskeletal:     Cervical back: Neck supple.     Comments: Thoracic and lumbar midline tenderness.  No thoracic paraspinal tenderness.  Significant lumbar paraspinal tenderness.  No crepitus, deformity, or step-off. able to ambulate a few steps in the room with extreme difficulty due to pain.  Lower extremities neurovascularly intact.  Skin:    General: Skin is warm and dry.  Neurological:     General: No focal deficit present.     Mental Status: She is alert.  Psychiatric:  Mood and Affect: Mood normal.        Behavior: Behavior normal.     ED Results / Procedures / Treatments   Labs (all labs ordered are listed, but only abnormal results are displayed) Labs Reviewed  I-STAT BETA HCG BLOOD, ED ([facility], [facility], [facility] ONLY)    EKG None  Radiology CT Thoracic Spine Wo Contrast  Result Date: 05/19/2019 CLINICAL DATA:  Back pain for 4 days. Injured playing dodge ball. EXAM: CT THORACIC AND LUMBAR SPINE WITHOUT CONTRAST TECHNIQUE: Multidetector CT imaging of the thoracic and lumbar spine was performed without contrast. Multiplanar CT image reconstructions were also generated. COMPARISON:  None. FINDINGS: CT THORACIC SPINE FINDINGS Alignment: Normal Vertebrae: Normal Paraspinal and other soft tissues: No significant paraspinal findings. No mediastinal or hilar mass is identified. Residual thymic tissue noted in the anterior mediastinum. The visualized lungs are grossly clear. No worrisome pulmonary lesions. The esophagus is grossly normal. No significant upper abdominal retroperitoneal findings. Disc levels: No thoracic disc protrusions, spinal or foraminal stenosis. CT LUMBAR SPINE FINDINGS Segmentation: There are five lumbar type vertebral bodies. The last full intervertebral disc space is labeled L5-S1. Alignment: Normal Vertebrae: Normal Paraspinal and other soft tissues: No significant paraspinal or retroperitoneal findings. Disc levels: T12-L1: No significant findings. L1-2: No  significant findings. L2-3: No significant findings. L3-4: Mild bulging annulus with slight flattening of the ventral thecal sac but no significant disc protrusion, spinal or foraminal stenosis. L4-5: Bulging annulus and shallow central disc protrusion with mild mass effect on the ventral thecal sac. No foraminal stenosis L5-S1: Broad-based bulging annulus and right paracentral disc protrusion with mass effect on the thecal sac centrally and to the right. No significant foraminal stenosis. IMPRESSION: 1. Normal alignment and no acute bony findings. 2. Broad-based bulging annulus and right paracentral disc protrusion at L5-S1 with mass effect on the thecal sac centrally and to the right. No significant foraminal stenosis. 3. Mild bulging annulus and shallow central disc protrusion at L4-5. 4. No thoracic spine disc protrusions or canal stenosis. Electronically Signed   By: Rudie Meyer M.D.   On: 05/19/2019 12:46   CT Lumbar Spine Wo Contrast  Result Date: 05/19/2019 CLINICAL DATA:  Back pain for 4 days. Injured playing dodge ball. EXAM: CT THORACIC AND LUMBAR SPINE WITHOUT CONTRAST TECHNIQUE: Multidetector CT imaging of the thoracic and lumbar spine was performed without contrast. Multiplanar CT image reconstructions were also generated. COMPARISON:  None. FINDINGS: CT THORACIC SPINE FINDINGS Alignment: Normal Vertebrae: Normal Paraspinal and other soft tissues: No significant paraspinal findings. No mediastinal or hilar mass is identified. Residual thymic tissue noted in the anterior mediastinum. The visualized lungs are grossly clear. No worrisome pulmonary lesions. The esophagus is grossly normal. No significant upper abdominal retroperitoneal findings. Disc levels: No thoracic disc protrusions, spinal or foraminal stenosis. CT LUMBAR SPINE FINDINGS Segmentation: There are five lumbar type vertebral bodies. The last full intervertebral disc space is labeled L5-S1. Alignment: Normal Vertebrae: Normal  Paraspinal and other soft tissues: No significant paraspinal or retroperitoneal findings. Disc levels: T12-L1: No significant findings. L1-2: No significant findings. L2-3: No significant findings. L3-4: Mild bulging annulus with slight flattening of the ventral thecal sac but no significant disc protrusion, spinal or foraminal stenosis. L4-5: Bulging annulus and shallow central disc protrusion with mild mass effect on the ventral thecal sac. No foraminal stenosis L5-S1: Broad-based bulging annulus and right paracentral disc protrusion with mass effect on the thecal sac centrally and to the right. No significant foraminal stenosis. IMPRESSION: 1.  Normal alignment and no acute bony findings. 2. Broad-based bulging annulus and right paracentral disc protrusion at L5-S1 with mass effect on the thecal sac centrally and to the right. No significant foraminal stenosis. 3. Mild bulging annulus and shallow central disc protrusion at L4-5. 4. No thoracic spine disc protrusions or canal stenosis. Electronically Signed   By: Rudie Meyer M.D.   On: 05/19/2019 12:46    Procedures Procedures (including critical care time)  Medications Ordered in ED Medications  lidocaine (LIDODERM) 5 % 2 patch (2 patches Transdermal Patch Applied 05/19/19 1104)  HYDROcodone-acetaminophen (NORCO/VICODIN) 5-325 MG per tablet 1 tablet (1 tablet Oral Given 05/19/19 1104)    ED Course  I have reviewed the triage vital signs and the nursing notes.  Pertinent labs & imaging results that were available during my care of the patient were reviewed by me and considered in my medical decision making (see chart for details).  Clinical Course as of May 18 1308  Wed May 19, 2019  1134 I-stat hCG, quantitative: <5.0 [CA]    Clinical Course User Index [CA] Mannie Stabile, PA-C   MDM Rules/Calculators/A&P                     24 year old female presents to the ED due to severe mid and low back pain after falling directly on her back 5  days ago.  Denies bowel/bladder incontinence, fever/chills, IV drug use, history of cancer, saddle paresthesias, abdominal pain, and urinary symptoms.  She does admit to intermittent bilateral lower extremity numbness/tingling and intermittent weakness.  Vitals all within normal limits.  Patient is afebrile, not tachycardic or hypoxic.  Patient no acute distress and nontoxic-appearing however appears very uncomfortable during initial exam and tearful.  Physical exam significant for midline thoracic and lumbar tenderness.  Patient only able to ambulate a few steps in the room due to severe pain.  Lower extremities neurovascularly intact.  Given patient's midline tenderness, will obtain CT thoracic and lumbar to rule out compression fractures due to patient's history of a fall.  Will treat pain here in the ED and reassess.  CT scans personally reviewed which demonstrates: IMPRESSION:  1. Normal alignment and no acute bony findings.  2. Broad-based bulging annulus and right paracentral disc protrusion  at L5-S1 with mass effect on the thecal sac centrally and to the  right. No significant foraminal stenosis.  3. Mild bulging annulus and shallow central disc protrusion at L4-5.  4. No thoracic spine disc protrusions or canal stenosis.     Discussed results with Dr. Silverio Lay, my supervising attending, who agrees with outpatient follow-up. No concern for central cord compression or cauda equina at this time. Will discharge patient with Robaxin, naproxen, and prednisone.  Patient instructed that Robaxin can cause drowsiness and do not drive or operate machinery while on the medication.  Neurosurgery referral given to patient at discharge.  Advised patient to call today to schedule an appointment for further evaluation. Strict ED precautions discussed with patient. Patient states understanding and agrees to plan. Patient discharged home in no acute distress and stable vitals.  Final Clinical Impression(s) / ED  Diagnoses Final diagnoses:  Bulging lumbar disc  Acute bilateral low back pain without sciatica  Acute midline thoracic back pain    Rx / DC Orders ED Discharge Orders         Ordered    naproxen (NAPROSYN) 500 MG tablet  2 times daily     05/19/19 1257  predniSONE (DELTASONE) 20 MG tablet  Daily     05/19/19 1257    methocarbamol (ROBAXIN) 500 MG tablet  2 times daily     05/19/19 1257           Jesusita Oka 05/19/19 1310    Charlynne Pander, MD 05/19/19 [phone]

## 2019-05-19 NOTE — Discharge Instructions (Addendum)
As discussed, your CT scans were negative for any broken bones.  It did show some pinched nerves in your back.  I am sending you home with a muscle relaxer, pain medication, and steroid.  Take as prescribed.  The muscle relaxer can cause drowsiness and do not drive or operate machinery while on the medication.  I have included the number for the neurosurgeon.  Call today to schedule an appointment for further evaluation.  Return to the ER if you develop issues urinating/having bowel movements, numbness and tingling in the groin, or worsening of symptoms.  You may also purchase over-the-counter Lidoderm patches and Voltaren gel as added pain relief.

## 2019-05-19 NOTE — ED Notes (Signed)
This nurse in pt room to review discharge summary , Pt verbalizes understanding. noted that pt received Norco, this nurse offered to assist with arranging transportation, cab/bus. Pt refusing, reports she feels fine, and that medication had no affect on her. Pt alert and oriented. Pt becoming increasingly agitated , stating she feels the same as when she came to ED.  This nurse notified PA.

## 2019-05-19 NOTE — ED Notes (Signed)
This nurse back to pt room, pt not willing to stay in the ED to be monitored, becoming increasingly agitated, pt walked out of the department.

## 2019-05-19 NOTE — ED Notes (Signed)
Pt transported to CT ?

## 2019-07-29 ENCOUNTER — Ambulatory Visit: Payer: Medicaid Other | Admitting: Podiatry

## 2019-08-13 ENCOUNTER — Ambulatory Visit (INDEPENDENT_AMBULATORY_CARE_PROVIDER_SITE_OTHER): Payer: Medicaid Other | Admitting: Podiatry

## 2019-08-13 DIAGNOSIS — Z5329 Procedure and treatment not carried out because of patient's decision for other reasons: Secondary | ICD-10-CM

## 2019-08-13 NOTE — Progress Notes (Signed)
No show for appt. 

## 2019-10-18 ENCOUNTER — Other Ambulatory Visit: Payer: Self-pay

## 2019-10-18 ENCOUNTER — Encounter (HOSPITAL_COMMUNITY): Payer: Self-pay | Admitting: Emergency Medicine

## 2019-10-18 ENCOUNTER — Emergency Department (HOSPITAL_COMMUNITY)
Admission: EM | Admit: 2019-10-18 | Discharge: 2019-10-18 | Disposition: A | Discharge status: Home / Self Care | Payer: Medicaid Other | Attending: Emergency Medicine | Admitting: Emergency Medicine

## 2019-10-18 DIAGNOSIS — Z5321 Procedure and treatment not carried out due to patient leaving prior to being seen by health care provider: Secondary | ICD-10-CM | POA: Insufficient documentation

## 2019-10-18 NOTE — ED Triage Notes (Signed)
Patient states she had a period at the beginning of August, had unprotected sex at the end of her period so she took plan B on August 16th to cover herself for pregnancy, now she has had vaginal bleeding for the past x3 days. 2-4 pads and tampons per day.

## 2019-10-18 NOTE — ED Notes (Signed)
Patient stated that she is leaving, she just wanted to make sure the bleeding she is having is normal. Told her she was welcome to wait in the lobby to be evaluated, however, she states she wants to leave.

## 2019-11-16 ENCOUNTER — Other Ambulatory Visit: Payer: Self-pay

## 2019-11-16 ENCOUNTER — Emergency Department (HOSPITAL_COMMUNITY)
Admission: EM | Admit: 2019-11-16 | Discharge: 2019-11-16 | Disposition: A | Discharge status: Home / Self Care | Payer: Medicaid Other | Attending: Emergency Medicine | Admitting: Emergency Medicine

## 2019-11-16 ENCOUNTER — Encounter (HOSPITAL_COMMUNITY): Payer: Self-pay | Admitting: Emergency Medicine

## 2019-11-16 DIAGNOSIS — L0291 Cutaneous abscess, unspecified: Secondary | ICD-10-CM

## 2019-11-16 DIAGNOSIS — N764 Abscess of vulva: Secondary | ICD-10-CM | POA: Insufficient documentation

## 2019-11-16 DIAGNOSIS — J45909 Unspecified asthma, uncomplicated: Secondary | ICD-10-CM | POA: Insufficient documentation

## 2019-11-16 DIAGNOSIS — Z79899 Other long term (current) drug therapy: Secondary | ICD-10-CM | POA: Insufficient documentation

## 2019-11-16 MED ORDER — CLINDAMYCIN HCL 300 MG PO CAPS
300.0000 mg | ORAL_CAPSULE | Freq: Once | ORAL | Status: AC
  Administered 2019-11-16: 20:00:00 300 mg via ORAL
  Filled 2019-11-16: qty 1

## 2019-11-16 MED ORDER — CLINDAMYCIN HCL 300 MG PO CAPS: 300.0000 mg | ORAL_CAPSULE | Freq: Four times a day (QID) | ORAL | 0 refills | Status: DC

## 2019-11-16 MED ORDER — HYDROCODONE-ACETAMINOPHEN 5-325 MG PO TABS: 1.0000 | ORAL_TABLET | Freq: Four times a day (QID) | ORAL | 0 refills | Status: DC | PRN

## 2019-11-16 MED ORDER — IBUPROFEN 200 MG PO TABS
600.0000 mg | ORAL_TABLET | Freq: Once | ORAL | Status: AC
  Administered 2019-11-16: 600 mg via ORAL
  Filled 2019-11-16: qty 3

## 2019-11-16 NOTE — ED Triage Notes (Signed)
Patient reports abscess to labia with drainage worsening x1 week.

## 2019-11-16 NOTE — ED Provider Notes (Signed)
[facility]-EMERGENCY DEPT Provider Note   CSN: [id number] Arrival date & time: 11/16/19  1849     History Chief Complaint  Patient presents with  . Abscess    Misty Sanders is a 24 y.o. female.  Patient is a 24 year old female who presents with abscess to her right labia. She says she has had recurrent episodes of the same abscess. She said she normally takes antibiotics and it goes away for a while but it always comes back. She says is been swollen and intermittently draining for the last week. She denies any fevers. She previously was seen her PCP regarding this recurrent abscess.        Past Medical History:  Diagnosis Date  . Arthritis   . Asthma     There are no problems to display for this patient.   Past Surgical History:  Procedure Laterality Date  . ADENOIDECTOMY    . TONSILLECTOMY    . WISDOM TOOTH EXTRACTION       OB History    Gravida  0   Para  0   Term  0   Preterm  0   AB  0   Living  0     SAB  0   TAB  0   Ectopic  0   Multiple  0   Live Births  0           Family History  Problem Relation Age of Onset  . Diabetes Mother   . Hypertension Mother   . Hyperlipidemia Mother     Social History   Tobacco Use  . Smoking status: Former Smoker    Packs/day: 0.25    Types: Cigarettes  . Smokeless tobacco: Never Used  Substance Use Topics  . Alcohol use: No  . Drug use: Yes    Types: Marijuana    Comment: denies any use currently    Home Medications Prior to Admission medications   Medication Sig Start Date End Date Taking? Authorizing Provider  albuterol (VENTOLIN HFA) 108 (90 Base) MCG/ACT inhaler Inhale 1-2 puffs into the lungs every 6 (six) hours as needed for wheezing or shortness of breath. 10/20/18   Sabas Sous, MD  benzonatate (TESSALON) 100 MG capsule Take 1 capsule (100 mg total) by mouth every 8 (eight) hours. Patient not taking: Reported on 02/07/2017 12/20/16   Hedges,  Tinnie Gens, PA-C  clindamycin (CLEOCIN) 300 MG capsule Take 1 capsule (300 mg total) by mouth 4 (four) times daily. X 7 days 11/16/19   Rolan Bucco, MD  guaiFENesin (ROBITUSSIN) 100 MG/5ML liquid Take 5-10 mLs (100-200 mg total) by mouth every 4 (four) hours as needed for cough. Patient not taking: Reported on 02/08/2019 11/02/18   Ward, Layla Maw, DO  HYDROcodone-acetaminophen (NORCO/VICODIN) 5-325 MG tablet Take 1-2 tablets by mouth every 6 (six) hours as needed. 11/16/19   Rolan Bucco, MD  hydrOXYzine (ATARAX/VISTARIL) 10 MG tablet Take 1 tablet (10 mg total) by mouth every 6 (six) hours as needed for itching. 02/08/19   Garlon Hatchet, PA-C  ibuprofen (ADVIL,MOTRIN) 400 MG tablet Take 1 tablet (400 mg total) by mouth every 6 (six) hours as needed. Patient not taking: Reported on 10/20/2018 02/07/17   Lorre Nick, MD  loratadine (CLARITIN) 10 MG tablet Take 1 tablet (10 mg total) by mouth daily. Patient not taking: Reported on 02/08/2019 10/20/18   Sabas Sous, MD  methocarbamol (ROBAXIN) 500 MG tablet Take 1 tablet (500 mg total) by mouth  2 (two) times daily. 05/19/19   Mannie Stabile, PA-C  methocarbamol (ROBAXIN-750) 750 MG tablet Take 1 tablet (750 mg total) by mouth 4 (four) times daily. Patient not taking: Reported on 10/20/2018 02/07/17   Lorre Nick, MD  naproxen (NAPROSYN) 500 MG tablet Take 1 tablet (500 mg total) by mouth 2 (two) times daily. 05/19/19   Mannie Stabile, PA-C  predniSONE (DELTASONE) 20 MG tablet Take 3 tablets (60 mg total) by mouth daily. Patient not taking: Reported on 02/08/2019 11/02/18   Ward, Layla Maw, DO  tolnaftate (TINACTIN) 1 % powder Apply 1 application topically 2 (two) times daily. Patient not taking: Reported on 02/08/2019 10/20/18   Sabas Sous, MD    Allergies    Penicillins, Eggs or egg-derived products, Peanuts [peanut oil], Sulfa antibiotics, and Latex  Review of Systems   Review of Systems  Constitutional: Negative for fever.    HENT: Negative.   Eyes: Negative.   Respiratory: Negative.   Gastrointestinal: Negative for abdominal pain, nausea and vomiting.  Genitourinary: Negative for vaginal bleeding and vaginal discharge.       Labial pain  Skin: Positive for wound.  Neurological: Negative for headaches.  All other systems reviewed and are negative.   Physical Exam Updated Vital Signs BP (!) 144/75   Pulse (!) 57   Temp 98.3 F (36.8 C)   Resp 16   Ht 5\' 6"  (1.676 m)   Wt 72.6 kg   LMP 11/09/2019   SpO2 99%   BMI 25.82 kg/m   Physical Exam Constitutional:      Appearance: She is well-developed.  HENT:     Head: Normocephalic and atraumatic.  Eyes:     Pupils: Pupils are equal, round, and reactive to light.  Cardiovascular:     Rate and Rhythm: Normal rate and regular rhythm.     Heart sounds: Normal heart sounds.  Pulmonary:     Effort: Pulmonary effort is normal. No respiratory distress.     Breath sounds: Normal breath sounds. No wheezing or rales.  Chest:     Chest wall: No tenderness.  Abdominal:     General: Bowel sounds are normal.     Palpations: Abdomen is soft.     Tenderness: There is no abdominal tenderness. There is no guarding or rebound.  Genitourinary:    Comments: Small, less than 1 cm tender abscess in the right labia. Its in the external upper part of the labia. There is no surrounding cellulitis evident. Musculoskeletal:        General: Normal range of motion.     Cervical back: Normal range of motion and neck supple.  Lymphadenopathy:     Cervical: No cervical adenopathy.  Skin:    General: Skin is warm and dry.     Findings: No rash.  Neurological:     Mental Status: She is alert and oriented to person, place, and time.     ED Results / Procedures / Treatments   Labs (all labs ordered are listed, but only abnormal results are displayed) Labs Reviewed - No data to display  EKG None  Radiology No results found.  Procedures Procedures (including  critical care time)  Medications Ordered in ED Medications  clindamycin (CLEOCIN) capsule 300 mg (has no administration in time range)  ibuprofen (ADVIL) tablet 600 mg (has no administration in time range)    ED Course  I have reviewed the triage vital signs and the nursing notes.  Pertinent labs & imaging  results that were available during my care of the patient were reviewed by me and considered in my medical decision making (see chart for details).    MDM Rules/Calculators/A&P                          Patient is a 24 year old female who presents with a current abscess to her right labia. It is in the upper external area of the labia. It does not appear to be consistent with a Bartholin's gland cyst. Given her recurrence of the abscess, it is likely to be an infected cyst. It is fairly small at this point. I did discuss options of trying to do an I&D. She is at this point adamantly opposed and wants to try antibiotics. I will start her on clindamycin and advised her to do warm compresses. She says it is draining intermittently. There is no suggestions of surrounding cellulitis. I will give her a referral to follow-up with OB/GYN. Return precautions were given. Final Clinical Impression(s) / ED Diagnoses Final diagnoses:  Abscess    Rx / DC Orders ED Discharge Orders         Ordered    clindamycin (CLEOCIN) 300 MG capsule  4 times daily        11/16/19 2018    HYDROcodone-acetaminophen (NORCO/VICODIN) 5-325 MG tablet  Every 6 hours PRN        11/16/19 2018           Rolan Bucco, MD 11/16/19 2023

## 2019-11-16 NOTE — Discharge Instructions (Signed)
Use warm compresses to the area. Follow-up with the [facility] clinic. Return here as needed for any worsening symptoms.

## 2019-11-17 ENCOUNTER — Telehealth: Payer: Self-pay | Admitting: *Deleted

## 2019-11-17 NOTE — Telephone Encounter (Signed)
Pharmacy called regarding diagnosis for pt prescribed Cleocin and NORCO.  RNCM reviewed the chart to find pt dx was labia abscess.

## 2019-11-24 ENCOUNTER — Encounter: Payer: Medicaid Other | Admitting: Student

## 2019-11-29 ENCOUNTER — Emergency Department (HOSPITAL_COMMUNITY)
Admission: EM | Admit: 2019-11-29 | Discharge: 2019-11-29 | Disposition: A | Discharge status: Home / Self Care | Payer: Medicaid Other | Attending: Emergency Medicine | Admitting: Emergency Medicine

## 2019-11-29 DIAGNOSIS — Z5321 Procedure and treatment not carried out due to patient leaving prior to being seen by health care provider: Secondary | ICD-10-CM | POA: Insufficient documentation

## 2019-11-29 DIAGNOSIS — R42 Dizziness and giddiness: Secondary | ICD-10-CM | POA: Insufficient documentation

## 2019-11-29 DIAGNOSIS — R109 Unspecified abdominal pain: Secondary | ICD-10-CM | POA: Insufficient documentation

## 2019-11-29 NOTE — ED Notes (Addendum)
Pt was Mad at Staff b/c we could not give her any Pain Medicine. Pt stated "Where's a phone so I can call my ride to come get me b/c I'm in Pain and yall ain't doing shit to help me". Pt then stormed out of triage to front doors.

## 2019-11-29 NOTE — ED Notes (Signed)
Unable to triage immediately upon EMS arrival, I was with another critical pt in triage. Pt screamed "I need a phone for someone to come get me, I'm in pain no one is trying to help me, I'm tired of waiting" RN tried to step out and talk with her, she refused to stop walking

## 2019-12-14 ENCOUNTER — Emergency Department (HOSPITAL_COMMUNITY): Payer: Self-pay

## 2019-12-14 ENCOUNTER — Encounter (HOSPITAL_COMMUNITY): Payer: Self-pay | Admitting: *Deleted

## 2019-12-14 ENCOUNTER — Emergency Department (HOSPITAL_COMMUNITY)
Admission: EM | Admit: 2019-12-14 | Discharge: 2019-12-14 | Disposition: A | Discharge status: Home / Self Care | Payer: Self-pay | Attending: Emergency Medicine | Admitting: Emergency Medicine

## 2019-12-14 DIAGNOSIS — Z87891 Personal history of nicotine dependence: Secondary | ICD-10-CM | POA: Insufficient documentation

## 2019-12-14 DIAGNOSIS — Z91040 Latex allergy status: Secondary | ICD-10-CM | POA: Insufficient documentation

## 2019-12-14 DIAGNOSIS — G8929 Other chronic pain: Secondary | ICD-10-CM | POA: Insufficient documentation

## 2019-12-14 DIAGNOSIS — J45901 Unspecified asthma with (acute) exacerbation: Secondary | ICD-10-CM | POA: Insufficient documentation

## 2019-12-14 DIAGNOSIS — J4541 Moderate persistent asthma with (acute) exacerbation: Secondary | ICD-10-CM

## 2019-12-14 DIAGNOSIS — Z91010 Allergy to peanuts: Secondary | ICD-10-CM | POA: Insufficient documentation

## 2019-12-14 DIAGNOSIS — M5459 Other low back pain: Secondary | ICD-10-CM | POA: Insufficient documentation

## 2019-12-14 DIAGNOSIS — Z20822 Contact with and (suspected) exposure to covid-19: Secondary | ICD-10-CM | POA: Insufficient documentation

## 2019-12-14 LAB — BASIC METABOLIC PANEL
Anion gap: 9 (ref 5–15)
BUN: 10 mg/dL (ref 6–20)
CO2: 21 mmol/L — ABNORMAL LOW (ref 22–32)
Calcium: 8.7 mg/dL — ABNORMAL LOW (ref 8.9–10.3)
Chloride: 109 mmol/L (ref 98–111)
Creatinine, Ser: 0.91 mg/dL (ref 0.44–1.00)
GFR, Estimated: >60 mL/min (ref >60)
Glucose, Bld: 109 mg/dL — ABNORMAL HIGH (ref 70–99)
Potassium: 3.6 mmol/L (ref 3.5–5.1)
Sodium: 139 mmol/L (ref 135–145)

## 2019-12-14 LAB — RESPIRATORY PANEL BY RT PCR (FLU A&B, COVID)
Influenza A by PCR: NEGATIVE
Influenza B by PCR: NEGATIVE
SARS Coronavirus 2 by RT PCR: NEGATIVE

## 2019-12-14 LAB — I-STAT BETA HCG BLOOD, ED (MC, WL, AP ONLY): I-stat hCG, quantitative: <5 m[IU]/mL (ref <5)

## 2019-12-14 LAB — CBC WITH DIFFERENTIAL/PLATELET
Abs Immature Granulocytes: 0.04 10*3/uL (ref 0.00–0.07)
Basophils Absolute: 0 10*3/uL (ref 0.0–0.1)
Basophils Relative: 0 %
Eosinophils Absolute: 0.2 10*3/uL (ref 0.0–0.5)
Eosinophils Relative: 2 %
HCT: 35.9 % — ABNORMAL LOW (ref 36.0–46.0)
Hemoglobin: 12.1 g/dL (ref 12.0–15.0)
Immature Granulocytes: 0 %
Lymphocytes Relative: 14 %
Lymphs Abs: 1.3 10*3/uL (ref 0.7–4.0)
MCH: 32 pg (ref 26.0–34.0)
MCHC: 33.7 g/dL (ref 30.0–36.0)
MCV: 95 fL (ref 80.0–100.0)
Monocytes Absolute: 0.7 10*3/uL (ref 0.1–1.0)
Monocytes Relative: 7 %
Neutro Abs: 7.5 10*3/uL (ref 1.7–7.7)
Neutrophils Relative %: 77 %
Platelets: 198 10*3/uL (ref 150–400)
RBC: 3.78 MIL/uL — ABNORMAL LOW (ref 3.87–5.11)
RDW: 13.2 % (ref 11.5–15.5)
WBC: 9.8 10*3/uL (ref 4.0–10.5)
nRBC: 0 % (ref 0.0–0.2)

## 2019-12-14 MED ORDER — SODIUM CHLORIDE 0.9 % IV BOLUS
1000.0000 mL | Freq: Once | INTRAVENOUS | Status: AC
  Administered 2019-12-14: 1000 mL via INTRAVENOUS

## 2019-12-14 MED ORDER — LIDOCAINE 5 % EX PTCH: 1.0000 | MEDICATED_PATCH | CUTANEOUS | 0 refills | Status: DC

## 2019-12-14 MED ORDER — ALBUTEROL SULFATE HFA 108 (90 BASE) MCG/ACT IN AERS
2.0000 | INHALATION_SPRAY | Freq: Once | RESPIRATORY_TRACT | Status: AC
  Administered 2019-12-14: 2 via RESPIRATORY_TRACT
  Filled 2019-12-14: qty 6.7

## 2019-12-14 MED ORDER — METHYLPREDNISOLONE SODIUM SUCC 125 MG IJ SOLR
125.0000 mg | Freq: Once | INTRAMUSCULAR | Status: AC
  Administered 2019-12-14: 125 mg via INTRAVENOUS
  Filled 2019-12-14: qty 2

## 2019-12-14 MED ORDER — PREDNISONE 50 MG PO TABS: 50.0000 mg | ORAL_TABLET | Freq: Every day | ORAL | 0 refills | Status: AC

## 2019-12-14 MED ORDER — KETOROLAC TROMETHAMINE 30 MG/ML IJ SOLN
30.0000 mg | Freq: Once | INTRAMUSCULAR | Status: DC
  Filled 2019-12-14: qty 1

## 2019-12-14 MED ORDER — HYDROMORPHONE HCL 1 MG/ML IJ SOLN
1.0000 mg | Freq: Once | INTRAMUSCULAR | Status: DC
  Filled 2019-12-14: qty 1

## 2019-12-14 MED ORDER — IPRATROPIUM BROMIDE HFA 17 MCG/ACT IN AERS
2.0000 | INHALATION_SPRAY | Freq: Once | RESPIRATORY_TRACT | Status: AC
  Administered 2019-12-14: 2 via RESPIRATORY_TRACT
  Filled 2019-12-14: qty 12.9

## 2019-12-14 MED ORDER — IPRATROPIUM-ALBUTEROL 0.5-2.5 (3) MG/3ML IN SOLN: 3.0000 mL | RESPIRATORY_TRACT | 1 refills | Status: DC | PRN

## 2019-12-14 NOTE — ED Triage Notes (Signed)
Pt called out for asthma, low back pain. She was given albuterol 5mg  neb en route, asthma improved. Initially diminished in all fields, lungs sound clear and equal after treatment.   Hx of pinched nerve in her back. No loss of bowel/bladder control. No injury. Pt given fentanyl en route.   BP 120/60 HR 114 RR 16 99% RA

## 2019-12-14 NOTE — ED Notes (Signed)
Patient refused her patient medication and got upset state we keep drugging her up. All she need is some water to drink.

## 2019-12-14 NOTE — Discharge Instructions (Signed)
Use the nebulizer for your breathing  Take the steroids which will help with your back pain as well as your breathing.  Covid test pending at discharge.  If positive they will call to let you know  Follow-up with orthopedics for your back pain  Return for new or worsening symptoms.

## 2019-12-14 NOTE — ED Notes (Signed)
An After Visit Summary was printed and given to the patient. Discharge instructions given and no further questions at this time.  

## 2019-12-14 NOTE — ED Provider Notes (Signed)
[facility]-EMERGENCY DEPT Provider Note   CSN: [id number] Arrival date & time: 12/14/19  0750    History Asthma, low back pain  Misty Sanders is a 24 y.o. female with past medical history significant for asthma who presents for evaluation of 2 separate complaints.  Patient states she has had low back pain times weeks.  Does not radiate.  Described as aching.  Worse with movement.  States she was seen by orthopedics previously told she had a "pinched nerve" in her back.  Has not been taking anything at home.  Rates her pain a 9/10.  Denies history IV drug use, bowel or bladder incontinence, saddle paresthesia, malignancy.  States this feels typical of her previous pain.  No recent injury or trauma.  Patient also with cough, wheezing.  Has noticed nonproductive cough over the last 4 days.  Associated congestion and rhinorrhea.  Feels short of breath.  Has been using her mom's inhalers.  No recent steroids.  She has been admitted to ICU and intubated "a long time ago" for her asthma.  Does note that she does typically have asthma exacerbations when the weather has changed like recently.  She did not receive COVID vaccine.  Denies fever, chills, nausea, vomiting, chest pain, hemoptysis, abdominal pain, dysuria, unilateral leg swelling, redness, warmth.  No prior history of PE or DVT.  No known Covid exposures.  History obtained from patient and past medical records.  No interpreter used.  HPI     Past Medical History:  Diagnosis Date  . Arthritis   . Asthma     There are no problems to display for this patient.   Past Surgical History:  Procedure Laterality Date  . ADENOIDECTOMY    . TONSILLECTOMY    . WISDOM TOOTH EXTRACTION       OB History    Gravida  0   Para  0   Term  0   Preterm  0   AB  0   Living  0     SAB  0   TAB  0   Ectopic  0   Multiple  0   Live Births  0           Family History  Problem Relation Age of Onset  .  Diabetes Mother   . Hypertension Mother   . Hyperlipidemia Mother     Social History   Tobacco Use  . Smoking status: Former Smoker    Packs/day: 0.25    Types: Cigarettes  . Smokeless tobacco: Never Used  Substance Use Topics  . Alcohol use: No  . Drug use: Yes    Types: Marijuana    Comment: denies any use currently    Home Medications Prior to Admission medications   Medication Sig Start Date End Date Taking? Authorizing Provider  albuterol (VENTOLIN HFA) 108 (90 Base) MCG/ACT inhaler Inhale 1-2 puffs into the lungs every 6 (six) hours as needed for wheezing or shortness of breath. 10/20/18   Sabas Sous, MD  benzonatate (TESSALON) 100 MG capsule Take 1 capsule (100 mg total) by mouth every 8 (eight) hours. Patient not taking: Reported on 02/07/2017 12/20/16   Hedges, Tinnie Gens, PA-C  clindamycin (CLEOCIN) 300 MG capsule Take 1 capsule (300 mg total) by mouth 4 (four) times daily. X 7 days 11/16/19   Rolan Bucco, MD  guaiFENesin (ROBITUSSIN) 100 MG/5ML liquid Take 5-10 mLs (100-200 mg total) by mouth every 4 (four) hours as needed for cough.  Patient not taking: Reported on 02/08/2019 11/02/18   Ward, Layla MawKristen N, DO  HYDROcodone-acetaminophen (NORCO/VICODIN) 5-325 MG tablet Take 1-2 tablets by mouth every 6 (six) hours as needed. 11/16/19   Rolan BuccoBelfi, Melanie, MD  hydrOXYzine (ATARAX/VISTARIL) 10 MG tablet Take 1 tablet (10 mg total) by mouth every 6 (six) hours as needed for itching. 02/08/19   Garlon HatchetSanders, Lisa M, PA-C  ibuprofen (ADVIL,MOTRIN) 400 MG tablet Take 1 tablet (400 mg total) by mouth every 6 (six) hours as needed. Patient not taking: Reported on 10/20/2018 02/07/17   Lorre NickAllen, Anthony, MD  ipratropium-albuterol (DUONEB) 0.5-2.5 (3) MG/3ML SOLN Take 3 mLs by nebulization every 2 (two) hours as needed. 12/14/19   Noelle Hoogland A, PA-C  lidocaine (LIDODERM) 5 % Place 1 patch onto the skin daily. Remove & Discard patch within 12 hours or as directed by MD 12/14/19   Kenton Fortin,  Yichen Gilardi A, PA-C  loratadine (CLARITIN) 10 MG tablet Take 1 tablet (10 mg total) by mouth daily. Patient not taking: Reported on 02/08/2019 10/20/18   Sabas SousBero, Michael M, MD  methocarbamol (ROBAXIN) 500 MG tablet Take 1 tablet (500 mg total) by mouth 2 (two) times daily. 05/19/19   Mannie StabileAberman, Caroline C, PA-C  methocarbamol (ROBAXIN-750) 750 MG tablet Take 1 tablet (750 mg total) by mouth 4 (four) times daily. Patient not taking: Reported on 10/20/2018 02/07/17   Lorre NickAllen, Anthony, MD  naproxen (NAPROSYN) 500 MG tablet Take 1 tablet (500 mg total) by mouth 2 (two) times daily. 05/19/19   Mannie StabileAberman, Caroline C, PA-C  predniSONE (DELTASONE) 50 MG tablet Take 1 tablet (50 mg total) by mouth daily for 5 days. 12/14/19 12/19/19  Helyne Genther A, PA-C  tolnaftate (TINACTIN) 1 % powder Apply 1 application topically 2 (two) times daily. Patient not taking: Reported on 02/08/2019 10/20/18   Sabas SousBero, Michael M, MD    Allergies    Penicillins, Eggs or egg-derived products, Peanuts [peanut oil], Sulfa antibiotics, and Latex  Review of Systems   Review of Systems  Constitutional: Negative.   HENT: Negative.   Respiratory: Positive for cough, shortness of breath and wheezing. Negative for apnea, choking, chest tightness and stridor.   Cardiovascular: Negative.   Gastrointestinal: Negative.   Genitourinary: Negative.   Musculoskeletal: Positive for back pain. Negative for arthralgias, gait problem, joint swelling, myalgias, neck pain and neck stiffness.  Skin: Negative.   Neurological: Negative.   All other systems reviewed and are negative.  Physical Exam Updated Vital Signs BP 108/72 (BP Location: Right Arm)   Pulse (!) 110   Temp 98 F (36.7 C) (Oral)   Resp 20   LMP 12/02/2019 (Approximate)   SpO2 95%   Physical Exam  Physical Exam  Constitutional: Pt appears well-developed and well-nourished. No distress.  HENT:  Head: Normocephalic and atraumatic.  Mouth/Throat: Oropharynx is clear and moist. No  oropharyngeal exudate.  Eyes: Conjunctivae are normal.  Neck: Normal range of motion. Neck supple.  Full ROM without pain  Cardiovascular: Normal rate, regular rhythm and intact distal pulses.   Pulmonary/Chest: Inspiratory and expiratory wheeze also with mild expiratory rhonchi.  No accessory muscle usage, mild tachycardia and tachypnea. Abdominal: Soft. Pt exhibits no distension. There is no tenderness, rebound or guarding. No abd bruit or pulsatile mass Musculoskeletal:  Full range of motion of the T-spine and L-spine with flexion, hyperextension, and lateral flexion. No midline tenderness or stepoffs. No tenderness to palpation of the spinous processes of the T-spine or L-spine. Moderate tenderness to palpation of the paraspinous muscles of  the L-spine bilataerlly. Negative straight leg raise. Lymphadenopathy:    Pt has no cervical adenopathy.  Neurological: Pt is alert. Pt has normal reflexes.  Reflex Scores:      Bicep reflexes are 2+ on the right side and 2+ on the left side.      Brachioradialis reflexes are 2+ on the right side and 2+ on the left side.      Patellar reflexes are 2+ on the right side and 2+ on the left side.      Achilles reflexes are 2+ on the right side and 2+ on the left side. Speech is clear and goal oriented, follows commands Normal 5/5 strength in upper and lower extremities bilaterally including dorsiflexion and plantar flexion, strong and equal grip strength Sensation normal to light and sharp touch Moves extremities without ataxia, coordination intact Normal gait Normal balance No Clonus Skin: Skin is warm and dry. No rash noted or lesions noted. Pt is not diaphoretic. No erythema, ecchymosis,edema or warmth.  Psychiatric: Pt has a normal mood and affect. Behavior is normal.  Nursing note and vitals reviewed. ED Results / Procedures / Treatments   Labs (all labs ordered are listed, but only abnormal results are displayed) Labs Reviewed  CBC WITH  DIFFERENTIAL/PLATELET - Abnormal; Notable for the following components:      Result Value   RBC 3.78 (*)    HCT 35.9 (*)    All other components within normal limits  BASIC METABOLIC PANEL - Abnormal; Notable for the following components:   CO2 21 (*)    Glucose, Bld 109 (*)    Calcium 8.7 (*)    All other components within normal limits  RESPIRATORY PANEL BY RT PCR (FLU A&B, COVID)  I-STAT BETA HCG BLOOD, ED ([facility], [facility], [facility] ONLY)    EKG EKG Interpretation  Date/Time:  Tuesday December 14 2019 08:20:54 EDT Ventricular Rate:  121 PR Interval:    QRS Duration: 77 QT Interval:  317 QTC Calculation: 450 R Axis:   81 Text Interpretation: Sinus tachycardia Consider right atrial enlargement Borderline repolarization abnormality Tachycardia is new Confirmed by Vanetta Mulders [phone]) on 12/14/2019 8:25:07 AM   Radiology DG Chest Portable 1 View  Result Date: 12/14/2019 CLINICAL DATA:  Shortness of breath. EXAM: PORTABLE CHEST 1 VIEW COMPARISON:  November 02, 2018. FINDINGS: The heart size and mediastinal contours are within normal limits. Both lungs are clear. No pneumothorax or pleural effusion is noted. The visualized skeletal structures are unremarkable. IMPRESSION: No active disease. Electronically Signed   By: Lupita Raider M.D.   On: 12/14/2019 09:01    Procedures Procedures (including critical care time)  Medications Ordered in ED Medications  ketorolac (TORADOL) 30 MG/ML injection 30 mg (30 mg Intravenous Not Given 12/14/19 0929)  HYDROmorphone (DILAUDID) injection 1 mg (1 mg Intravenous Not Given 12/14/19 0930)  methylPREDNISolone sodium succinate (SOLU-MEDROL) 125 mg/2 mL injection 125 mg (125 mg Intravenous Given 12/14/19 0834)  ipratropium (ATROVENT HFA) inhaler 2 puff (2 puffs Inhalation Given 12/14/19 0858)  albuterol (VENTOLIN HFA) 108 (90 Base) MCG/ACT inhaler 2 puff (2 puffs Inhalation Given 12/14/19 0834)  sodium chloride 0.9 % bolus 1,000 mL (0 mLs Intravenous  Stopped 12/14/19 4967)   ED Course  I have reviewed the triage vital signs and the nursing notes.  Pertinent labs & imaging results that were available during my care of the patient were reviewed by me and considered in my medical decision making (see chart for details).  24 year old presents  for evaluation of multiple similar complaints.  She is afebrile, nonseptic, not ill-appearing.  Patient presents with cough, congestion, rhinorrhea, shortness of breath and wheeze.  Feels similar to her prior asthma exacerbations.  She has not vaccinate against Covid.  No chest pain, hemoptysis, unilateral leg swelling, redness or warmth.  No recent surgery, mobilization, malignancy, history of PE or DVT.  On arrival she is mildly tachycardic, has diffuse inspiratory expiratory wheeze however no accessory muscle usage.  Patient also with low back pain.  Has been intermittent over the last few months.  No radiation to lower extremities.  No bowel or bladder incontinence, saddle paresthesia, history IV drug use.  Does not take anything for pain.  No recent injury or trauma.  I am able to reproduce her pain on exam.  Her abdomen is soft, nontender.  No midline pulsatile abdominal mass.  I low suspicion for acute intra-abdominal process, AAA, dissection, bacterial infectious process.  No urinary complaints, flank pain.  Low suspicion for pyelonephritis, nephrolithiasis.  Plan on labs, imaging, reassess, steroids, pain control.  Labs and imaging personally reviewed and interpreted:  CBC without leukocytosis Metabolic panel without electrolyte, renal or normality pregnancy test negative Covid pending DG chest without acute infiltrates, cardiomegaly [place] edema, pneumothorax EKG without ischemia  Patient reassessed. Declines additional pain meds. States her back pain is at her baseline.  I did discuss follow-up with orthopedics given this seems chronic in nature.  I low suspicion for acute neurosurgical emergency  such as cauda equina, discitis, osteomyelitis, transverse myelitis, psoas abscess.  Patient breathing has significantly improved.  Does still have mild expiratory wheeze however she states her breathing is at her baseline.  She does not want any additional breathing treatments at this time.  Will refill her nebulizer prescription, steroid burst.  I discussed return precautions with her.  She is ambulatory without any hypoxia.  Covid test pending at discharge.  The patient has been appropriately medically screened and/or stabilized in the ED. I have low suspicion for any other emergent medical condition which would require further screening, evaluation or treatment in the ED or require inpatient management.  Patient is hemodynamically stable and in no acute distress.  Patient able to ambulate in department prior to ED.  Evaluation does not show acute pathology that would require ongoing or additional emergent interventions while in the emergency department or further inpatient treatment.  I have discussed the diagnosis with the patient and answered all questions.  Pain is been managed while in the emergency department and patient has no further complaints prior to discharge.  Patient is comfortable with plan discussed in room and is stable for discharge at this time.  I have discussed strict return precautions for returning to the emergency department.  Patient was encouraged to follow-up with PCP/specialist refer to at discharge.    MDM Rules/Calculators/A&P                          Misty Sanders was evaluated in Emergency Department on 12/14/2019 for the symptoms described in the history of present illness. She was evaluated in the context of the global COVID-19 pandemic, which necessitated consideration that the patient might be at risk for infection with the SARS-CoV-2 virus that causes COVID-19. Institutional protocols and algorithms that pertain to the evaluation of patients at risk for COVID-19  are in a state of rapid change based on information released by regulatory bodies including the CDC and federal and state  organizations. These policies and algorithms were followed during the patient's care in the ED. Final Clinical Impression(s) / ED Diagnoses Final diagnoses:  Moderate persistent asthma with exacerbation  Chronic bilateral low back pain without sciatica    Rx / DC Orders ED Discharge Orders         Ordered    ipratropium-albuterol (DUONEB) 0.5-2.5 (3) MG/3ML SOLN  Every 2 hours PRN        12/14/19 1017    predniSONE (DELTASONE) 50 MG tablet  Daily        12/14/19 1017    lidocaine (LIDODERM) 5 %  Every 24 hours        12/14/19 1017           Dov Dill A, PA-C 12/14/19 1020    Vanetta Mulders, MD 12/15/19 [phone]

## 2019-12-15 ENCOUNTER — Emergency Department (HOSPITAL_COMMUNITY)
Admission: EM | Admit: 2019-12-15 | Discharge: 2019-12-15 | Disposition: A | Discharge status: Home / Self Care | Payer: Medicaid Other | Attending: Emergency Medicine | Admitting: Emergency Medicine

## 2019-12-15 ENCOUNTER — Other Ambulatory Visit: Payer: Self-pay

## 2019-12-15 DIAGNOSIS — Z91010 Allergy to peanuts: Secondary | ICD-10-CM | POA: Insufficient documentation

## 2019-12-15 DIAGNOSIS — Z91040 Latex allergy status: Secondary | ICD-10-CM | POA: Insufficient documentation

## 2019-12-15 DIAGNOSIS — M5442 Lumbago with sciatica, left side: Secondary | ICD-10-CM

## 2019-12-15 DIAGNOSIS — J45909 Unspecified asthma, uncomplicated: Secondary | ICD-10-CM

## 2019-12-15 DIAGNOSIS — M5432 Sciatica, left side: Secondary | ICD-10-CM | POA: Insufficient documentation

## 2019-12-15 DIAGNOSIS — Z87891 Personal history of nicotine dependence: Secondary | ICD-10-CM | POA: Insufficient documentation

## 2019-12-15 MED ORDER — METHOCARBAMOL 1000 MG/10ML IJ SOLN
1000.0000 mg | Freq: Once | INTRAMUSCULAR | Status: DC
  Filled 2019-12-15: qty 10

## 2019-12-15 MED ORDER — METHOCARBAMOL 1000 MG/10ML IJ SOLN
1000.0000 mg | Freq: Once | INTRAVENOUS | Status: AC
  Administered 2019-12-15: 1000 mg via INTRAVENOUS
  Filled 2019-12-15: qty 10

## 2019-12-15 MED ORDER — METHOCARBAMOL 500 MG PO TABS: 500.0000 mg | ORAL_TABLET | Freq: Three times a day (TID) | ORAL | 0 refills | Status: DC | PRN

## 2019-12-15 MED ORDER — DEXAMETHASONE SODIUM PHOSPHATE 10 MG/ML IJ SOLN
10.0000 mg | Freq: Once | INTRAMUSCULAR | Status: AC
  Administered 2019-12-15: 10 mg via INTRAVENOUS
  Filled 2019-12-15: qty 1

## 2019-12-15 MED ORDER — KETOROLAC TROMETHAMINE 15 MG/ML IJ SOLN
15.0000 mg | Freq: Once | INTRAMUSCULAR | Status: AC
  Administered 2019-12-15: 15 mg via INTRAVENOUS
  Filled 2019-12-15: qty 1

## 2019-12-15 MED ORDER — LIDOCAINE 5 % EX PTCH
1.0000 | MEDICATED_PATCH | CUTANEOUS | Status: DC
  Administered 2019-12-15: 1 via TRANSDERMAL
  Filled 2019-12-15: qty 1

## 2019-12-15 NOTE — ED Notes (Signed)
Pt ambulated down the hall steady with no problem

## 2019-12-15 NOTE — ED Provider Notes (Signed)
[facility]-EMERGENCY DEPT Provider Note   CSN: [id number] Arrival date & time: 12/15/19  1052     History Chief Complaint  Patient presents with  . Back Pain    Misty Sanders is a 24 y.o. female presenting for evaluation of back pain.  Patient states he has chronic back pain, but is been worse recently.  She was seen in the ED yesterday, treat with medications and felt better.  However she was unable to pick up her prescriptions, and when she woke up this morning, her back was extremely stiff and she was unable to walk due to pain.  She reports no new symptoms since yesterday, no new fevers, chills, numbness, loss of bowel bladder control, trauma or injury.  No history of IV drug use or cancer.  She has not taken anything for pain so far today.  She does not have an orthopedic doctor due to lack of insurance. Patient reports continued cough due to her asthma.  This is typical of her asthma.  She has been using her inhaler with minimal to no improvement.  Additional history obtained from chart review.  Reviewed visit from yesterday.  Patient had reassuring labs and chest x-ray work-up.  She had negative Covid test.  HPI     Past Medical History:  Diagnosis Date  . Arthritis   . Asthma     There are no problems to display for this patient.   Past Surgical History:  Procedure Laterality Date  . ADENOIDECTOMY    . TONSILLECTOMY    . WISDOM TOOTH EXTRACTION       OB History    Gravida  0   Para  0   Term  0   Preterm  0   AB  0   Living  0     SAB  0   TAB  0   Ectopic  0   Multiple  0   Live Births  0           Family History  Problem Relation Age of Onset  . Diabetes Mother   . Hypertension Mother   . Hyperlipidemia Mother     Social History   Tobacco Use  . Smoking status: Former Smoker    Packs/day: 0.25    Types: Cigarettes  . Smokeless tobacco: Never Used  Substance Use Topics  . Alcohol use: No  . Drug  use: Yes    Types: Marijuana    Comment: denies any use currently    Home Medications Prior to Admission medications   Medication Sig Start Date End Date Taking? Authorizing Provider  albuterol (VENTOLIN HFA) 108 (90 Base) MCG/ACT inhaler Inhale 1-2 puffs into the lungs every 6 (six) hours as needed for wheezing or shortness of breath. 10/20/18   Sabas Sous, MD  benzonatate (TESSALON) 100 MG capsule Take 1 capsule (100 mg total) by mouth every 8 (eight) hours. Patient not taking: Reported on 02/07/2017 12/20/16   Hedges, Tinnie Gens, PA-C  clindamycin (CLEOCIN) 300 MG capsule Take 1 capsule (300 mg total) by mouth 4 (four) times daily. X 7 days 11/16/19   Rolan Bucco, MD  guaiFENesin (ROBITUSSIN) 100 MG/5ML liquid Take 5-10 mLs (100-200 mg total) by mouth every 4 (four) hours as needed for cough. Patient not taking: Reported on 02/08/2019 11/02/18   Ward, Layla Maw, DO  HYDROcodone-acetaminophen (NORCO/VICODIN) 5-325 MG tablet Take 1-2 tablets by mouth every 6 (six) hours as needed. 11/16/19   Rolan Bucco, MD  hydrOXYzine (ATARAX/VISTARIL) 10 MG tablet Take 1 tablet (10 mg total) by mouth every 6 (six) hours as needed for itching. 02/08/19   Garlon Hatchet, PA-C  ibuprofen (ADVIL,MOTRIN) 400 MG tablet Take 1 tablet (400 mg total) by mouth every 6 (six) hours as needed. Patient not taking: Reported on 10/20/2018 02/07/17   Lorre Nick, MD  ipratropium-albuterol (DUONEB) 0.5-2.5 (3) MG/3ML SOLN Take 3 mLs by nebulization every 2 (two) hours as needed. 12/14/19   Henderly, Britni A, PA-C  lidocaine (LIDODERM) 5 % Place 1 patch onto the skin daily. Remove & Discard patch within 12 hours or as directed by MD 12/14/19   Henderly, Britni A, PA-C  loratadine (CLARITIN) 10 MG tablet Take 1 tablet (10 mg total) by mouth daily. Patient not taking: Reported on 02/08/2019 10/20/18   Sabas Sous, MD  methocarbamol (ROBAXIN) 500 MG tablet Take 1 tablet (500 mg total) by mouth 3 (three) times daily as  needed for muscle spasms. 12/15/19   Shafter Jupin, PA-C  naproxen (NAPROSYN) 500 MG tablet Take 1 tablet (500 mg total) by mouth 2 (two) times daily. 05/19/19   Mannie Stabile, PA-C  predniSONE (DELTASONE) 50 MG tablet Take 1 tablet (50 mg total) by mouth daily for 5 days. 12/14/19 12/19/19  Henderly, Britni A, PA-C  tolnaftate (TINACTIN) 1 % powder Apply 1 application topically 2 (two) times daily. Patient not taking: Reported on 02/08/2019 10/20/18   Sabas Sous, MD    Allergies    Penicillins, Eggs or egg-derived products, Peanuts [peanut oil], Sulfa antibiotics, and Latex  Review of Systems   Review of Systems  Respiratory: Positive for cough and wheezing.   Musculoskeletal: Positive for back pain.  All other systems reviewed and are negative.   Physical Exam Updated Vital Signs BP 118/65 (BP Location: Right Arm)   Pulse 80   Temp 98 F (36.7 C) (Oral)   Resp 18   Ht 5\' 6"  (1.676 m)   Wt 74.8 kg   LMP 12/02/2019 (Approximate)   SpO2 98%   BMI 26.62 kg/m   Physical Exam Vitals and nursing note reviewed.  Constitutional:      General: She is not in acute distress.    Appearance: She is well-developed.     Comments: Appears nontoxic  HENT:     Head: Normocephalic and atraumatic.  Eyes:     Extraocular Movements: Extraocular movements intact.     Conjunctiva/sclera: Conjunctivae normal.     Pupils: Pupils are equal, round, and reactive to light.  Cardiovascular:     Rate and Rhythm: Normal rate and regular rhythm.     Pulses: Normal pulses.  Pulmonary:     Effort: Pulmonary effort is normal. No respiratory distress.     Breath sounds: Wheezing present.     Comments: Expiratory wheezing in all fields. Speaking in full sentences. Cough noted Abdominal:     General: There is no distension.     Palpations: Abdomen is soft. There is no mass.     Tenderness: There is no abdominal tenderness. There is no guarding or rebound.  Musculoskeletal:         General: Tenderness present. Normal range of motion.     Cervical back: Normal range of motion and neck supple.     Comments: ttp of bilateral low back musculature extending over the buttocks. +SLR bilaterally, worse on L. Pedal pulses 2+. Good distal sensation and cap refill. No saddle paresthesias.   Skin:    General:  Skin is warm and dry.     Capillary Refill: Capillary refill takes less than 2 seconds.  Neurological:     Mental Status: She is alert and oriented to person, place, and time.     ED Results / Procedures / Treatments   Labs (all labs ordered are listed, but only abnormal results are displayed) Labs Reviewed - No data to display  EKG None  Radiology DG Chest Portable 1 View  Result Date: 12/14/2019 CLINICAL DATA:  Shortness of breath. EXAM: PORTABLE CHEST 1 VIEW COMPARISON:  November 02, 2018. FINDINGS: The heart size and mediastinal contours are within normal limits. Both lungs are clear. No pneumothorax or pleural effusion is noted. The visualized skeletal structures are unremarkable. IMPRESSION: No active disease. Electronically Signed   By: Lupita Raider M.D.   On: 12/14/2019 09:01    Procedures Procedures (including critical care time)  Medications Ordered in ED Medications  lidocaine (LIDODERM) 5 % 1 patch (1 patch Transdermal Patch Applied 12/15/19 1304)  ketorolac (TORADOL) 15 MG/ML injection 15 mg (15 mg Intravenous Given 12/15/19 1304)  dexamethasone (DECADRON) injection 10 mg (10 mg Intravenous Given 12/15/19 1304)  methocarbamol (ROBAXIN) 1,000 mg in dextrose 5 % 100 mL IVPB (0 mg Intravenous Stopped 12/15/19 1415)    ED Course  I have reviewed the triage vital signs and the nursing notes.  Pertinent labs & imaging results that were available during my care of the patient were reviewed by me and considered in my medical decision making (see chart for details).    MDM Rules/Calculators/A&P                          Patient presenting for  evaluation of continued severe back pain.  On exam, patient appears nontoxic.  She is neurovascularly intact.  No red flags of back pain.  She was seen yesterday, her symptoms improved after medication, however she did not take any more medication at home.  As such, today her back pain is worsened again.  Additionally, she is presenting with wheezing and cough.  She has a history of asthma, states this feels typical of her asthma flares.  She had a negative x-ray and reassuring labs yesterday, as well as a negative Covid test.  As such, likely asthma exacerbation.  Respiratory exam is overall reassuring except for scattered expiratory wheezes.  Will treat with IV Robaxin, Toradol, and Decadron.  On reassessment, patient reports pain is much improved.  She also reports her wheezing is improved.  She has ambulated without difficulty.  I discussed continued treatment with medications prescribed yesterday as well as addition of Robaxin that I will send her home with today.  Encourage follow-up with primary care.  At this time, patient appears safe for discharge.  Return precautions given.  Patient states she understands and agrees to plan.  Final Clinical Impression(s) / ED Diagnoses Final diagnoses:  Acute bilateral low back pain with left-sided sciatica  Uncomplicated asthma, unspecified asthma severity, unspecified whether persistent    Rx / DC Orders ED Discharge Orders         Ordered    methocarbamol (ROBAXIN) 500 MG tablet  3 times daily PRN        12/15/19 1418           Kerryann Allaire, PA-C 12/15/19 1423    Linwood Dibbles, MD 12/16/19 [phone]

## 2019-12-15 NOTE — Discharge Instructions (Addendum)
Call the clinic listed below to set up a primary care appointment.  They will see folks with or without insurance, and often have resources to help with specialty care as needed. Take medications as prescribed for your back, including the medications prescribed yesterday.  It is important you take them together, as this will likely improve your symptoms.   Use the muscle relaxer as needed for stiffness or soreness, have caution this make you tired or groggy.  Do not drive or operate heavy machinery while taking this medicine. Continue using your inhaler every 4 hours for the next 2 days, use after this as needed for shortness of breath, chest tightness, coughing fits, wheezing. Return to the emergency room if you develop fevers, loss of bowel or bladder control, inability to walk, or any new, worsening, or concerning symptoms.
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
## 2020-01-06 ENCOUNTER — Encounter (HOSPITAL_COMMUNITY): Payer: Self-pay | Admitting: Emergency Medicine

## 2020-01-06 ENCOUNTER — Other Ambulatory Visit: Payer: Self-pay

## 2020-01-06 ENCOUNTER — Emergency Department (HOSPITAL_COMMUNITY)
Admission: EM | Admit: 2020-01-06 | Discharge: 2020-01-06 | Disposition: A | Discharge status: Home / Self Care | Payer: Medicaid Other | Attending: Emergency Medicine | Admitting: Emergency Medicine

## 2020-01-06 DIAGNOSIS — M545 Low back pain, unspecified: Secondary | ICD-10-CM | POA: Insufficient documentation

## 2020-01-06 DIAGNOSIS — Z87891 Personal history of nicotine dependence: Secondary | ICD-10-CM | POA: Diagnosis not present

## 2020-01-06 DIAGNOSIS — J45909 Unspecified asthma, uncomplicated: Secondary | ICD-10-CM | POA: Insufficient documentation

## 2020-01-06 DIAGNOSIS — G8929 Other chronic pain: Secondary | ICD-10-CM

## 2020-01-06 MED ORDER — METHOCARBAMOL 500 MG PO TABS: 500.0000 mg | ORAL_TABLET | Freq: Three times a day (TID) | ORAL | 0 refills | Status: DC | PRN
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
## 2020-01-06 NOTE — Discharge Instructions (Addendum)
Follow up with a primary care provider of your choice. You can call the [facility] if you have no other resources for a primary doctor.   Return to the emergency department with any new or worsening symptoms.
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
## 2020-01-11 ENCOUNTER — Inpatient Hospital Stay: Payer: Medicaid Other | Admitting: Family Medicine

## 2020-02-08 ENCOUNTER — Inpatient Hospital Stay: Payer: Medicaid Other | Admitting: Family Medicine

## 2020-02-09 ENCOUNTER — Inpatient Hospital Stay: Payer: Medicaid Other | Admitting: Family Medicine
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
## 2020-03-09 ENCOUNTER — Ambulatory Visit: Payer: Self-pay | Attending: Family Medicine | Admitting: Physician Assistant

## 2020-03-09 ENCOUNTER — Other Ambulatory Visit: Payer: Self-pay

## 2020-03-10 ENCOUNTER — Ambulatory Visit: Payer: Self-pay | Admitting: Internal Medicine

## 2020-03-10 ENCOUNTER — Telehealth: Payer: Self-pay | Admitting: General Practice

## 2020-03-10 NOTE — Telephone Encounter (Signed)
Copied from CRM [id number]. Topic: Appointment Scheduling - Scheduling Inquiry for Clinic >> Mar 10, 2020 11:22 AM Wyonia Hough E wrote: Reason for CRM: Pt needs to reschedule her appt she missed today with Bruce Swords/pt asked for a late afternoon appt/ please advise   Patient has no showed 3 times in a row. Doesn't she need to be on walk-in basis only?

## 2020-03-15 ENCOUNTER — Other Ambulatory Visit: Payer: Self-pay

## 2020-03-15 ENCOUNTER — Ambulatory Visit: Payer: Self-pay | Attending: Family Medicine | Admitting: Physician Assistant
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
## 2020-03-31 ENCOUNTER — Telehealth: Payer: Self-pay | Admitting: General Practice

## 2020-03-31 NOTE — Telephone Encounter (Signed)
Copied from CRM [id number]. Topic: General - Inquiry >> Mar 29, 2020 12:48 PM Cuthrell, Pearlean Brownie wrote: Reason for CRM: Patient called In to see how much her copay was, advised patient per appt details her copay was 0.00, patient stated she know she has to pay something because she doesn't have health insurance, patient also wants to know why her appt is virtual, patient stated that she was told it would be an in office visit, Patient is requesting a call back today. C/B: [phone]  Called patient, unable to leave voicemail. Since she is uninsured there will be no co-pay or charges associated with the visit up-front, meaning she does not have to pay anything the day of her visit. Depending on the services she receives she will be billed for the visit- unable to determine how much that would cost.  Patient appointment is virtual because it was rescheduled from 1/19 visit when provider decided to do a virtual visit.

## 2020-04-06 ENCOUNTER — Ambulatory Visit: Payer: Self-pay | Attending: Family Medicine | Admitting: Physician Assistant

## 2020-04-06 ENCOUNTER — Other Ambulatory Visit: Payer: Self-pay | Admitting: Physician Assistant

## 2020-04-06 ENCOUNTER — Other Ambulatory Visit: Payer: Self-pay

## 2020-04-06 DIAGNOSIS — B351 Tinea unguium: Secondary | ICD-10-CM

## 2020-04-06 DIAGNOSIS — Z09 Encounter for follow-up examination after completed treatment for conditions other than malignant neoplasm: Secondary | ICD-10-CM

## 2020-04-06 DIAGNOSIS — B353 Tinea pedis: Secondary | ICD-10-CM

## 2020-04-06 DIAGNOSIS — Z79899 Other long term (current) drug therapy: Secondary | ICD-10-CM

## 2020-04-06 MED ORDER — CLOTRIMAZOLE-BETAMETHASONE 1-0.05 % EX CREA: 1.0000 "application " | TOPICAL_CREAM | Freq: Two times a day (BID) | CUTANEOUS | 3 refills | Status: DC

## 2020-04-06 MED FILL — CLOTRIMAZOLE-BETAMETHASONE: 1-0.05 | 20 days supply | Qty: 45 | Fill #0

## 2020-04-06 NOTE — Progress Notes (Signed)
Foot fungus Burning /itching  Brittle black toenails Has had this for years

## 2020-04-06 NOTE — Progress Notes (Signed)
Patient ID: Misty Sanders, female   DOB: 1995-08-29, 25 y.o.   MRN: [id number] Virtual Visit via Telephone Note  I connected with Misty Sanders on 04/06/20 at  2:50 PM EST by telephone and verified that I am speaking with the correct person using two identifiers.  Location: Patient: home Provider: [facility] office   I discussed the limitations, risks, security and privacy concerns of performing an evaluation and management service by telephone and the availability of in person appointments. I also discussed with the patient that there may be a patient responsible charge related to this service. The patient expressed understanding and agreed to proceed.  History of Present Illness: Patient seen at ED 01/06/2020 and has had to keep rescheduling to establish care.  She c/o itchy rash on B feet and also thickened toenails that are hard to cut and they split at times. Wants to be put on lamisil.  No LFT on file since 2017.  OTC have not helped  Observations/Objective: NAD.  A&Ox3  Assessment and Plan: 1. Onychomycosis -will Rx 12 weeks lamisil if LFT ok - Comprehensive metabolic panel; Future  2. Tinea pedis of both feet - clotrimazole-betamethasone (LOTRISONE) cream; Apply 1 application topically 2 (two) times daily. For 4 weeks to affected skin  Dispense: 45 g; Refill: 3  3. High risk medication use - Comprehensive metabolic panel; Future  4. Encounter for examination following treatment at hospital   Follow Up Instructions: Assign PCP in 2 months   I discussed the assessment and treatment plan with the patient. The patient was provided an opportunity to ask questions and all were answered. The patient agreed with the plan and demonstrated an understanding of the instructions.   The patient was advised to call back or seek an in-person evaluation if the symptoms worsen or if the condition fails to improve as anticipated.  I provided 8 minutes of non-face-to-face time during this  encounter.   Georgian Co, PA-C

## 2020-04-10 ENCOUNTER — Ambulatory Visit: Payer: Self-pay | Attending: Family Medicine

## 2020-04-10 ENCOUNTER — Other Ambulatory Visit: Payer: Self-pay

## 2020-04-10 DIAGNOSIS — B351 Tinea unguium: Secondary | ICD-10-CM

## 2020-04-10 DIAGNOSIS — Z79899 Other long term (current) drug therapy: Secondary | ICD-10-CM

## 2020-04-11 LAB — COMPREHENSIVE METABOLIC PANEL
ALT: 11 IU/L (ref 0–32)
AST: 16 IU/L (ref 0–40)
Albumin/Globulin Ratio: 1.6 (ref 1.2–2.2)
Albumin: 4 g/dL (ref 3.9–5.0)
Alkaline Phosphatase: 76 IU/L (ref 44–121)
BUN/Creatinine Ratio: 10 (ref 9–23)
BUN: 10 mg/dL (ref 6–20)
Bilirubin Total: 0.2 mg/dL (ref 0.0–1.2)
CO2: 22 mmol/L (ref 20–29)
Calcium: 9.1 mg/dL (ref 8.7–10.2)
Chloride: 108 mmol/L — ABNORMAL HIGH (ref 96–106)
Creatinine, Ser: 0.99 mg/dL (ref 0.57–1.00)
GFR calc Af Amer: 91 mL/min/{1.73_m2} (ref >59)
GFR calc non Af Amer: 79 mL/min/{1.73_m2} (ref >59)
Globulin, Total: 2.5 g/dL (ref 1.5–4.5)
Glucose: 79 mg/dL (ref 65–99)
Potassium: 4.6 mmol/L (ref 3.5–5.2)
Sodium: 142 mmol/L (ref 134–144)
Total Protein: 6.5 g/dL (ref 6.0–8.5)

## 2020-04-13 ENCOUNTER — Telehealth: Payer: Self-pay | Admitting: General Practice

## 2020-04-13 ENCOUNTER — Encounter: Payer: Self-pay | Admitting: *Deleted

## 2020-04-13 NOTE — Telephone Encounter (Signed)
Copied from CRM [id number]. Topic: General - Other >> Apr 13, 2020  1:38 PM Gwenlyn Fudge wrote: Reason for CRM: Pt called  and is requesting to have a nurse give her a call back to go over her recent lab results. Please advise.

## 2020-04-14 NOTE — Telephone Encounter (Signed)
Pt name and DOB verified. Patient aware of results and result note per Angela McClung, PA-C 

## 2020-06-01 ENCOUNTER — Ambulatory Visit: Payer: Medicaid Other | Admitting: Physician Assistant

## 2020-06-12 NOTE — Progress Notes (Signed)
Patient did not show for appointment.   

## 2020-06-13 ENCOUNTER — Encounter: Payer: Medicaid Other | Admitting: Family

## 2020-06-13 DIAGNOSIS — Z7689 Persons encountering health services in other specified circumstances: Secondary | ICD-10-CM

## 2020-09-13 ENCOUNTER — Ambulatory Visit: Payer: No Typology Code available for payment source | Admitting: Podiatry

## 2020-09-21 ENCOUNTER — Ambulatory Visit: Payer: Self-pay | Admitting: Podiatry

## 2020-10-11 ENCOUNTER — Ambulatory Visit: Payer: Self-pay | Admitting: Podiatry

## 2020-11-02 ENCOUNTER — Encounter (HOSPITAL_BASED_OUTPATIENT_CLINIC_OR_DEPARTMENT_OTHER): Payer: Self-pay | Admitting: *Deleted

## 2020-11-02 ENCOUNTER — Other Ambulatory Visit: Payer: Self-pay

## 2020-11-02 ENCOUNTER — Emergency Department (HOSPITAL_BASED_OUTPATIENT_CLINIC_OR_DEPARTMENT_OTHER)
Admission: EM | Admit: 2020-11-02 | Discharge: 2020-11-02 | Disposition: A | Discharge status: Home / Self Care | Payer: Self-pay | Attending: Emergency Medicine | Admitting: Emergency Medicine

## 2020-11-02 DIAGNOSIS — Z91040 Latex allergy status: Secondary | ICD-10-CM | POA: Insufficient documentation

## 2020-11-02 DIAGNOSIS — Z91010 Allergy to peanuts: Secondary | ICD-10-CM | POA: Insufficient documentation

## 2020-11-02 DIAGNOSIS — M5441 Lumbago with sciatica, right side: Secondary | ICD-10-CM | POA: Insufficient documentation

## 2020-11-02 DIAGNOSIS — M5442 Lumbago with sciatica, left side: Secondary | ICD-10-CM | POA: Insufficient documentation

## 2020-11-02 DIAGNOSIS — R202 Paresthesia of skin: Secondary | ICD-10-CM | POA: Insufficient documentation

## 2020-11-02 DIAGNOSIS — Z87891 Personal history of nicotine dependence: Secondary | ICD-10-CM | POA: Insufficient documentation

## 2020-11-02 DIAGNOSIS — J45909 Unspecified asthma, uncomplicated: Secondary | ICD-10-CM | POA: Insufficient documentation

## 2020-11-02 MED ORDER — PREDNISONE 10 MG PO TABS: ORAL_TABLET | ORAL | 0 refills | Status: AC

## 2020-11-02 MED ORDER — PREDNISONE 10 MG PO TABS
ORAL_TABLET | ORAL | 0 refills | Status: DC
  Filled 2020-11-02: qty 10.5, 6d supply, fill #0

## 2020-11-02 MED ORDER — KETOROLAC TROMETHAMINE 30 MG/ML IJ SOLN
30.0000 mg | Freq: Once | INTRAMUSCULAR | Status: AC
  Administered 2020-11-02: 30 mg via INTRAVENOUS
  Filled 2020-11-02: qty 1
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
## 2020-11-02 NOTE — Discharge Instructions (Addendum)
You are being discharged home with a Prednisone taper. Please follow the instructions carefully that are written on your prescription.  I have also included a phone number for an orthopedic provider who you can call to further evaluate this.   Your examination today is most concerning for a muscular injury 1. Medications: alternate ibuprofen and tylenol for pain control, take all usual home medications as they are prescribed 2. Treatment: rest, ice, elevate and use an ACE wrap or other compressive therapy to decrease swelling. Also drink plenty of fluids and do plenty of gentle stretching and move the affected muscle through its normal range of motion to prevent stiffness. 3. Follow Up: If your symptoms do not improve please follow up with orthopedics/sports medicine or your PCP for discussion of your diagnoses and further evaluation after today's visit; if you do not have a primary care doctor use the resource guide provided to find one; Please return to the ER for worsening symptoms or other concerns.

## 2020-11-02 NOTE — ED Triage Notes (Signed)
C/o right lower back pain which radiates down right leg x 3 days , hx of sciatica

## 2020-11-03 ENCOUNTER — Other Ambulatory Visit (HOSPITAL_COMMUNITY): Payer: Self-pay

## 2020-11-03 ENCOUNTER — Emergency Department (HOSPITAL_COMMUNITY)
Admission: EM | Admit: 2020-11-03 | Discharge: 2020-11-03 | Discharge status: Against Medical Advice | Payer: Medicaid Other

## 2020-11-03 ENCOUNTER — Other Ambulatory Visit: Payer: Self-pay

## 2020-11-03 ENCOUNTER — Emergency Department (HOSPITAL_COMMUNITY): Payer: Self-pay

## 2020-11-03 ENCOUNTER — Encounter (HOSPITAL_COMMUNITY): Payer: Self-pay

## 2020-11-03 ENCOUNTER — Emergency Department (HOSPITAL_COMMUNITY)
Admission: EM | Admit: 2020-11-03 | Discharge: 2020-11-03 | Disposition: A | Discharge status: Home / Self Care | Payer: Self-pay | Attending: Emergency Medicine | Admitting: Emergency Medicine

## 2020-11-03 DIAGNOSIS — Z91010 Allergy to peanuts: Secondary | ICD-10-CM | POA: Insufficient documentation

## 2020-11-03 DIAGNOSIS — R202 Paresthesia of skin: Secondary | ICD-10-CM | POA: Insufficient documentation

## 2020-11-03 DIAGNOSIS — M5430 Sciatica, unspecified side: Secondary | ICD-10-CM

## 2020-11-03 DIAGNOSIS — M5432 Sciatica, left side: Secondary | ICD-10-CM | POA: Insufficient documentation

## 2020-11-03 DIAGNOSIS — Z87891 Personal history of nicotine dependence: Secondary | ICD-10-CM | POA: Insufficient documentation

## 2020-11-03 DIAGNOSIS — Z91040 Latex allergy status: Secondary | ICD-10-CM | POA: Insufficient documentation

## 2020-11-03 DIAGNOSIS — J45909 Unspecified asthma, uncomplicated: Secondary | ICD-10-CM | POA: Insufficient documentation

## 2020-11-03 LAB — URINALYSIS, ROUTINE W REFLEX MICROSCOPIC
Bilirubin Urine: NEGATIVE
Glucose, UA: NEGATIVE mg/dL
Hgb urine dipstick: NEGATIVE
Ketones, ur: NEGATIVE mg/dL
Leukocytes,Ua: NEGATIVE
Nitrite: NEGATIVE
Protein, ur: NEGATIVE mg/dL
Specific Gravity, Urine: >1.03 — ABNORMAL HIGH (ref 1.005–1.030)
pH: 6 (ref 5.0–8.0)

## 2020-11-03 LAB — PREGNANCY, URINE: Preg Test, Ur: NEGATIVE

## 2020-11-03 MED ORDER — KETOROLAC TROMETHAMINE 30 MG/ML IJ SOLN
30.0000 mg | Freq: Once | INTRAMUSCULAR | Status: DC
  Filled 2020-11-03: qty 1

## 2020-11-03 NOTE — ED Notes (Signed)
Ambulatory out of ED, clear speech, swearing, upset with staff. Pt did not stay to receive discharge instructions or paperwork.

## 2020-11-03 NOTE — ED Provider Notes (Signed)
[facility]-EMERGENCY DEPT Provider Note   CSN: [id number] Arrival date & time: 11/03/20  0432     History Chief Complaint  Patient presents with   Back Pain    Misty Sanders is a 25 y.o. female with a complaint of pain and tingling in along her lower extremities.  Seen last night in the [facility] emergency department for the same.  She was discharged with prednisone taper however she states "I do not know what that is even supposed to do."  Reports that this has been going on for a week and appears to be worsening.  She has used anti-inflammatories, lidocaine patches and muscle relaxants without help.    Past Medical History:  Diagnosis Date   Arthritis    Asthma     There are no problems to display for this patient.   Past Surgical History:  Procedure Laterality Date   ADENOIDECTOMY     TONSILLECTOMY     WISDOM TOOTH EXTRACTION       OB History     Gravida  0   Para  0   Term  0   Preterm  0   AB  0   Living  0      SAB  0   IAB  0   Ectopic  0   Multiple  0   Live Births  0           Family History  Problem Relation Age of Onset   Diabetes Mother    Hypertension Mother    Hyperlipidemia Mother     Social History   Tobacco Use   Smoking status: Former    Packs/day: 0.25    Types: Cigarettes   Smokeless tobacco: Never  Substance Use Topics   Alcohol use: No   Drug use: Yes    Types: Marijuana    Comment: denies any use currently    Home Medications Prior to Admission medications   Medication Sig Start Date End Date Taking? Authorizing Provider  clotrimazole-betamethasone (LOTRISONE) cream Apply 1 application topically 2 (two) times daily. For 4 weeks to affected skin 04/06/20   Anders Simmonds, PA-C  clotrimazole-betamethasone (LOTRISONE) cream APPLY 1 APPLICATION TOPICALLY 2 (TWO) TIMES DAILY. FOR 4 WEEKS TO AFFECTED SKIN 04/06/20 04/06/21  Anders Simmonds, PA-C  predniSONE (DELTASONE) 10  MG tablet Take 3 tablets (30 mg total) by mouth daily for 1 day, THEN 2.5 tablets (25 mg total) daily for 1 day, THEN 2 tablets (20 mg total) daily for 1 day, THEN 1.5 tablets (15 mg total) daily for 1 day, THEN 1 tablet (10 mg total) daily for 1 day, THEN 0.5 tablets (5 mg total) daily for 1 day. 11/02/20 11/08/20  Loeffler, Finis Bud, PA-C    Allergies    Penicillins, Eggs or egg-derived products, Peanuts [peanut oil], Sulfa antibiotics, and Latex  Review of Systems   Review of Systems  Reason unable to perform ROS: Full ROS difficulty to obtain due to patient's agitation and refusal to participate fully.  Musculoskeletal:  Positive for back pain.  Neurological:  Negative for dizziness and facial asymmetry.  Psychiatric/Behavioral:  Positive for agitation.    Physical Exam Updated Vital Signs BP 107/73 (BP Location: Left Arm)   Pulse (!) 55   Temp 98.7 F (37.1 C) (Oral)   Resp 16   Ht 5\' 7"  (1.702 m)   Wt 74.8 kg   LMP 10/29/2020   SpO2 100%  BMI 25.84 kg/m   Physical Exam Vitals and nursing note reviewed.  Constitutional:      Appearance: Normal appearance.  HENT:     Head: Normocephalic and atraumatic.  Eyes:     General: No scleral icterus.    Conjunctiva/sclera: Conjunctivae normal.  Cardiovascular:     Rate and Rhythm: Normal rate and regular rhythm.  Pulmonary:     Effort: Pulmonary effort is normal. No respiratory distress.  Musculoskeletal:        General: No swelling, tenderness or signs of injury.     Comments: Patient unable to bear weight on her left leg, screaming out in pain  Skin:    General: Skin is warm and dry.     Findings: No rash.  Neurological:     Mental Status: She is alert.  Psychiatric:     Comments: Patient standoffish and frustrated.    ED Results / Procedures / Treatments   Labs (all labs ordered are listed, but only abnormal results are displayed) Labs Reviewed  PREGNANCY, URINE  URINALYSIS, ROUTINE W REFLEX MICROSCOPIC     EKG None  Radiology No results found.  Procedures Procedures   Medications Ordered in ED Medications  ketorolac (TORADOL) 30 MG/ML injection 30 mg (30 mg Intravenous Patient Refused/Not Given 11/03/20 5701)    ED Course  I have reviewed the triage vital signs and the nursing notes.  Pertinent labs & imaging results that were available during my care of the patient were reviewed by me and considered in my medical decision making (see chart for details).    MDM Rules/Calculators/A&P 25 year old female presenting with a complaint of lower extremities.  I attempted to give the patient Toradol for pain control however I received a message from the nurse that she was refusing this medication.  Not RN came and notified me of patient agitation.  When I went to evaluate the patient at the bedside she was very angry and said "I do not like taking medication I do not know what you are putting in me."  She continues to refuse nonopioid pain medication and show verbal aggression with the staff.  I discussed with the patient that her options for treatment were to except pain medication such as Toradol or acetaminophen so that we could obtain the radiograph or continue to refuse treatment and return home.  Patient states "just let me go home you stupid people." Mother apologetic about daughter's behavior and attempted to convince her to take the pain medication however daughter verbally aggressive with her mother as well. I am agreeable to patient discharge.  Information about sciatica and return precautions attached to discharge papers.    Final Clinical Impression(s) / ED Diagnoses Final diagnoses:  Sciatica, unspecified laterality    Rx / DC Orders Results and diagnoses were explained to the patient. Return precautions discussed in full. Patient had no additional questions and expressed complete understanding.     Saddie Benders, PA-C 11/03/20 1039    Edwin Dada P, DO 11/04/20  [phone]

## 2020-11-03 NOTE — Discharge Instructions (Addendum)
You are being discharged per your request.  I suggest that you pick up your prednisone at the pharmacy and began taking this for your inflammation and nerve pain.  You may also continue to utilize over-the-counter medications such as ibuprofen and acetaminophen.  Remember that if you continue to use lidocaine patches that you only wear them for 12 hours and then have 12 hours patch free.  Please return to an emergency department if your symptoms continue to worsen.

## 2020-11-03 NOTE — ED Notes (Signed)
Called 3x for triage. Eloped prior to.

## 2020-11-03 NOTE — ED Triage Notes (Signed)
Pt arrives EMS from home with c/o lower back pain radiating down left leg.

## 2020-11-04 ENCOUNTER — Encounter (HOSPITAL_COMMUNITY): Payer: Self-pay

## 2020-11-04 ENCOUNTER — Other Ambulatory Visit: Payer: Self-pay

## 2020-11-04 ENCOUNTER — Emergency Department (HOSPITAL_COMMUNITY)
Admission: EM | Admit: 2020-11-04 | Discharge: 2020-11-04 | Discharge status: Against Medical Advice | Payer: Medicaid Other | Attending: Emergency Medicine | Admitting: Emergency Medicine

## 2020-11-04 DIAGNOSIS — M79605 Pain in left leg: Secondary | ICD-10-CM | POA: Insufficient documentation

## 2020-11-04 DIAGNOSIS — Z5321 Procedure and treatment not carried out due to patient leaving prior to being seen by health care provider: Secondary | ICD-10-CM | POA: Insufficient documentation

## 2020-11-04 NOTE — ED Triage Notes (Signed)
Patient arrives from home with complaints of ongoing left leg pain x5 days. Patient reports she's been seen at various ER's for the same with no explanation besides sciatic nerve pain. Unable to bear weight on left leg. No recent injuries. Rates pain an 8/10.

## 2020-11-04 NOTE — ED Notes (Signed)
Patient left and stated "she couldn't wait any longer."

## 2020-11-07 ENCOUNTER — Ambulatory Visit (INDEPENDENT_AMBULATORY_CARE_PROVIDER_SITE_OTHER): Payer: Self-pay | Admitting: Orthopaedic Surgery

## 2020-11-07 ENCOUNTER — Other Ambulatory Visit: Payer: Self-pay

## 2020-11-07 ENCOUNTER — Encounter (HOSPITAL_BASED_OUTPATIENT_CLINIC_OR_DEPARTMENT_OTHER): Payer: Self-pay | Admitting: Orthopaedic Surgery

## 2020-11-07 ENCOUNTER — Other Ambulatory Visit (HOSPITAL_BASED_OUTPATIENT_CLINIC_OR_DEPARTMENT_OTHER): Payer: Self-pay

## 2020-11-07 VITALS — BP 124/75 | Ht 67.0 in | Wt 175.0 lb

## 2020-11-07 DIAGNOSIS — M5416 Radiculopathy, lumbar region: Secondary | ICD-10-CM

## 2020-11-07 MED ORDER — MELOXICAM 15 MG PO TABS
15.0000 mg | ORAL_TABLET | Freq: Every day | ORAL | 3 refills | Status: DC
  Filled 2020-11-07: qty 30, 30d supply, fill #0

## 2020-11-07 NOTE — Progress Notes (Signed)
Chief Complaint: Radiating back pain     History of Present Illness:   Pain Score: 7/10 SANE: 0/100  Misty Sanders is a 25 y.o. female with lower back pain going on for well over a year now.  She states that she will have periodic episodes of back pain radiating to the leg.  She has now began to have numbness and tingling that goes down the leg over the front of the leg into the big toes.  She states that this is feeling numb.  She denies any changes in bowel or bladder.  She has been taking Naprosyn and muscle relaxers which did not help.  Has not had an MRI.  She has had a CT in the past.  Says the pain radiates and it worsens throughout the day.  She is having a difficulty walking longer distance.  She uses a cane for assistance with ambulation    Surgical History:   None  PMH/PSH/Family History/Social History/Meds/Allergies:    Past Medical History:  Diagnosis Date   Arthritis    Asthma    Past Surgical History:  Procedure Laterality Date   ADENOIDECTOMY     TONSILLECTOMY     WISDOM TOOTH EXTRACTION     Social History   Socioeconomic History   Marital status: Single    Spouse name: Not on file   Number of children: Not on file   Years of education: Not on file   Highest education level: Not on file  Occupational History   Not on file  Tobacco Use   Smoking status: Former    Packs/day: 0.25    Types: Cigarettes   Smokeless tobacco: Never  Substance and Sexual Activity   Alcohol use: No   Drug use: Yes    Types: Marijuana    Comment: denies any use currently   Sexual activity: Yes    Birth control/protection: None    Comment: last sex 23 Dec 2015  Other Topics Concern   Not on file  Social History Narrative   Not on file   Social Determinants of Health   Financial Resource Strain: Not on file  Food Insecurity: Not on file  Transportation Needs: Not on file  Physical Activity: Not on file  Stress: Not on file   Social Connections: Not on file   Family History  Problem Relation Age of Onset   Diabetes Mother    Hypertension Mother    Hyperlipidemia Mother    Allergies  Allergen Reactions   Penicillins Anaphylaxis and Other (See Comments)    Has patient had a PCN reaction causing immediate rash, facial/tongue/throat swelling, SOB or lightheadedness with hypotension: Yes Has patient had a PCN reaction causing severe rash involving mucus membranes or skin necrosis: No Has patient had a PCN reaction that required hospitalization: No Has patient had a PCN reaction occurring within the last 10 years: No If all of the above answers are "NO", then may proceed with Cephalosporin use.   Eggs Or Egg-Derived Products Itching and Swelling   Peanuts [Peanut Oil] Hives   Sulfa Antibiotics Hives   Latex Rash   Current Outpatient Medications  Medication Sig Dispense Refill   meloxicam (MOBIC) 15 MG tablet Take 1 tablet (15 mg total) by mouth daily. 30 tablet 3   clotrimazole-betamethasone (LOTRISONE) cream Apply 1  application topically 2 (two) times daily. For 4 weeks to affected skin 45 g 3   clotrimazole-betamethasone (LOTRISONE) cream APPLY 1 APPLICATION TOPICALLY 2 (TWO) TIMES DAILY. FOR 4 WEEKS TO AFFECTED SKIN 45 g 3   predniSONE (DELTASONE) 10 MG tablet Take 3 tablets (30 mg total) by mouth daily for 1 day, THEN 2.5 tablets (25 mg total) daily for 1 day, THEN 2 tablets (20 mg total) daily for 1 day, THEN 1.5 tablets (15 mg total) daily for 1 day, THEN 1 tablet (10 mg total) daily for 1 day, THEN 0.5 tablets (5 mg total) daily for 1 day. 10.5 tablet 0   No current facility-administered medications for this visit.   No results found.  Review of Systems:   A ROS was performed including pertinent positives and negatives as documented in the HPI.  Physical Exam :   Constitutional: NAD and appears stated age Neurological: Alert and oriented Psych: Appropriate affect and cooperative Blood pressure  124/75, height 5\' 7"  (1.702 m), weight 175 lb (79.4 kg), last menstrual period 10/29/2020.   Comprehensive Musculoskeletal Exam:   Lower back pain that radiates over the anterior leg.  Positive straight leg raise.  Endorses numbness in the first dorsal webspace of the left foot.  She has have weakness with dorsiflexion and plantar flexion of the left foot.  Rest of the neurovascular exam is intact.  Imaging:     I personally reviewed and interpreted the radiographs.   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  She has failed anti-inflammatories.  I will plan to order an MRI to assess for any type of lumbar herniation and refer her for a injection in the spine.  We have also transition her from naproxen to Mobic that she may take a medication only once daily.  Plan :    - Mobic 15 mg daily prescribed - MRI lumbar spine ordered - Return PRN  I believe that advance imaging in the form of an MRI is indicated for the following reasons: -Xrays images were obtained and not diagnostic -The patient has failed treatment modalities including naproxen -The following worrisome symptoms are present on history and exam: radiating pain down leg for over 1 yaer   I personally saw and evaluated the patient, and participated in the management and treatment plan.  30, MD Attending Physician, Orthopedic Surgery  This document was dictated using Dragon voice recognition software. A reasonable attempt at proof reading has been made to minimize errors.

## 2020-11-13 ENCOUNTER — Ambulatory Visit: Payer: Medicaid Other

## 2020-11-19 ENCOUNTER — Other Ambulatory Visit: Payer: Self-pay

## 2020-11-19 ENCOUNTER — Ambulatory Visit
Admission: RE | Admit: 2020-11-19 | Discharge: 2020-11-19 | Disposition: A | Discharge status: Home / Self Care | Payer: Medicaid Other | Source: Ambulatory Visit | Attending: Orthopaedic Surgery | Admitting: Orthopaedic Surgery

## 2020-11-19 DIAGNOSIS — M5416 Radiculopathy, lumbar region: Secondary | ICD-10-CM

## 2020-11-23 ENCOUNTER — Ambulatory Visit: Payer: Self-pay | Admitting: Podiatry

## 2020-11-28 ENCOUNTER — Ambulatory Visit (INDEPENDENT_AMBULATORY_CARE_PROVIDER_SITE_OTHER): Payer: Self-pay | Admitting: Orthopaedic Surgery

## 2020-11-28 ENCOUNTER — Other Ambulatory Visit: Payer: Self-pay

## 2020-11-28 VITALS — Ht 67.0 in | Wt 175.0 lb

## 2020-11-28 DIAGNOSIS — M5416 Radiculopathy, lumbar region: Secondary | ICD-10-CM

## 2020-11-28 NOTE — Progress Notes (Signed)
Chief Complaint: Radiating back pain     History of Present Illness:   11/28/2020: She presents today for follow-up of her MRI.  Notes that she continues to have pain down the lateral aspect of the hip.  She has been on Mobic which is somewhat helpful but does not completely relieve the pain.  Misty Sanders is a 25 y.o. female with lower back pain going on for well over a year now.  She states that she will have periodic episodes of back pain radiating to the leg.  She has now began to have numbness and tingling that goes down the leg over the front of the leg into the big toes.  She states that this is feeling numb.  She denies any changes in bowel or bladder.  She has been taking Naprosyn and muscle relaxers which did not help.  Has not had an MRI.  She has had a CT in the past.  Says the pain radiates and it worsens throughout the day.  She is having a difficulty walking longer distance.  She uses a cane for assistance with ambulation    Surgical History:   None  PMH/PSH/Family History/Social History/Meds/Allergies:    Past Medical History:  Diagnosis Date   Arthritis    Asthma    Past Surgical History:  Procedure Laterality Date   ADENOIDECTOMY     TONSILLECTOMY     WISDOM TOOTH EXTRACTION     Social History   Socioeconomic History   Marital status: Single    Spouse name: Not on file   Number of children: Not on file   Years of education: Not on file   Highest education level: Not on file  Occupational History   Not on file  Tobacco Use   Smoking status: Former    Packs/day: 0.25    Types: Cigarettes   Smokeless tobacco: Never  Substance and Sexual Activity   Alcohol use: No   Drug use: Yes    Types: Marijuana    Comment: denies any use currently   Sexual activity: Yes    Birth control/protection: None    Comment: last sex 23 Dec 2015  Other Topics Concern   Not on file  Social History Narrative   Not on file   Social  Determinants of Health   Financial Resource Strain: Not on file  Food Insecurity: Not on file  Transportation Needs: Not on file  Physical Activity: Not on file  Stress: Not on file  Social Connections: Not on file   Family History  Problem Relation Age of Onset   Diabetes Mother    Hypertension Mother    Hyperlipidemia Mother    Allergies  Allergen Reactions   Penicillins Anaphylaxis and Other (See Comments)    Has patient had a PCN reaction causing immediate rash, facial/tongue/throat swelling, SOB or lightheadedness with hypotension: Yes Has patient had a PCN reaction causing severe rash involving mucus membranes or skin necrosis: No Has patient had a PCN reaction that required hospitalization: No Has patient had a PCN reaction occurring within the last 10 years: No If all of the above answers are "NO", then may proceed with Cephalosporin use.   Eggs Or Egg-Derived Products Itching and Swelling   Peanuts [Peanut Oil] Hives   Sulfa Antibiotics Hives   Latex Rash  Current Outpatient Medications  Medication Sig Dispense Refill   clotrimazole-betamethasone (LOTRISONE) cream Apply 1 application topically 2 (two) times daily. For 4 weeks to affected skin 45 g 3   clotrimazole-betamethasone (LOTRISONE) cream APPLY 1 APPLICATION TOPICALLY 2 (TWO) TIMES DAILY. FOR 4 WEEKS TO AFFECTED SKIN 45 g 3   meloxicam (MOBIC) 15 MG tablet Take 1 tablet (15 mg total) by mouth daily. 30 tablet 3   No current facility-administered medications for this visit.   No results found.  Review of Systems:   A ROS was performed including pertinent positives and negatives as documented in the HPI.  Physical Exam :   Constitutional: NAD and appears stated age Neurological: Alert and oriented Psych: Appropriate affect and cooperative Height 5\' 7"  (1.702 m), weight 175 lb (79.4 kg), last menstrual period 10/29/2020.   Comprehensive Musculoskeletal Exam:   Lower back pain that radiates over the  anterior leg.  Positive straight leg raise.  Endorses numbness in the first dorsal webspace of the left foot.  She has have weakness with dorsiflexion and plantar flexion of the left foot.  Rest of the neurovascular exam is intact.   Imaging:   MRI lumbar spine: Lumbar spondylosis, as outlined and with findings most notably as follows.   At L5-S1, a broad-based central/right subarticular disc extrusion with slight caudal migration contributes to severe right subarticular stenosis, encroaching upon the descending right S1 nerve root. It also contributes to mild left subarticular narrowing, and may contact the descending left S1 nerve root. Mild relative narrowing of the central canal. Multifactorial moderate bilateral neural foraminal narrowing.   At L4-L5, there is a sizable disc extrusion spanning the central and bilateral subarticular zones, and extending caudally to the left to the mid L5 vertebral body level. The disc extrusion contributes to severe left subarticular/lateral recess stenosis, encroaching upon multiple descending left-sided nerve roots (most notably the descending left L5 nerve root). The disc extrusion also contributes to moderate/severe right subarticular stenosis, encroaching upon the descending right L5 nerve root. Multifactorial mild-to-moderate narrowing of the central canal and bilateral neural foramina.   At L3-L4, a broad-based shallow central disc protrusion contributes to mild bilateral subarticular narrowing with slight crowding of the descending L4 nerve roots.   Lumbar dextrocurvature.  I personally reviewed and interpreted the radiographs.   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  MRI is consistent with significant disc herniations at L4-L5 and L5-S1.  She has failed anti-inflammatories.  I would like her to perform physical therapy for a home strengthening program of her  core.  I also plan to refer her for a injection based on MRI findings  Plan :    -Plan for referral for lumbar steroid injections -PT ordered for core strengthening home program   I personally saw and evaluated the patient, and participated in the management and treatment plan.  30, MD Attending Physician, Orthopedic Surgery  This document was dictated using Dragon voice recognition software. A reasonable attempt at proof reading has been made to minimize errors.

## 2020-12-13 ENCOUNTER — Ambulatory Visit (HOSPITAL_BASED_OUTPATIENT_CLINIC_OR_DEPARTMENT_OTHER): Payer: No Typology Code available for payment source | Attending: Orthopaedic Surgery | Admitting: Physical Therapy

## 2020-12-27 ENCOUNTER — Ambulatory Visit
Payer: No Typology Code available for payment source | Attending: Critical Care Medicine | Admitting: Critical Care Medicine

## 2020-12-27 ENCOUNTER — Other Ambulatory Visit: Payer: Self-pay

## 2020-12-27 ENCOUNTER — Encounter: Payer: Self-pay | Admitting: Critical Care Medicine

## 2020-12-27 VITALS — BP 109/68 | HR 61 | Resp 16 | Ht 67.0 in | Wt 177.0 lb

## 2020-12-27 DIAGNOSIS — B353 Tinea pedis: Secondary | ICD-10-CM

## 2020-12-27 DIAGNOSIS — B351 Tinea unguium: Secondary | ICD-10-CM

## 2020-12-27 DIAGNOSIS — M5416 Radiculopathy, lumbar region: Secondary | ICD-10-CM

## 2020-12-27 MED ORDER — CLOTRIMAZOLE 1 % EX CREA
1.0000 "application " | TOPICAL_CREAM | Freq: Two times a day (BID) | CUTANEOUS | 0 refills | Status: DC
  Filled 2020-12-27: qty 30, 15d supply, fill #0

## 2020-12-27 MED ORDER — CICLOPIROX 8 % EX SOLN
Freq: Every day | CUTANEOUS | 0 refills | Status: DC
  Filled 2020-12-27: qty 6.6, 30d supply, fill #0

## 2020-12-27 MED ORDER — LIDOCAINE 5 % EX PTCH
1.0000 | MEDICATED_PATCH | CUTANEOUS | 0 refills | Status: DC
  Filled 2020-12-27: qty 30, 30d supply, fill #0

## 2020-12-27 MED ORDER — MELOXICAM 15 MG PO TABS
15.0000 mg | ORAL_TABLET | Freq: Every day | ORAL | 3 refills | Status: DC
  Filled 2020-12-27: qty 60, 60d supply, fill #0

## 2020-12-27 NOTE — Progress Notes (Signed)
New Patient Office Visit  Subjective:  Patient ID: Misty Sanders, female    DOB: [birth year]  Age: 25 y.o. MRN: [id number]  CC:  Chief Complaint  Patient presents with   Establish Care    HPI [facility] presents for to establish for primary care.  Patient has history of tinea pedis and onychomycosis of the toenails and also chronic lower back pain.  She has not been able to work because the severity of her lower back pain.  She has an L5-S1 and L4-L5 disc protrusion pinching nerves laterally primarily on the left side.  This results in left-sided leg pain and weakness worse when sitting and standing.  She is under the care of orthopedic spine and they are wanting to perform lumbar injections however she does not have insurance at this time.  In addition she continues have a dry itching in the feet and also itching in the toenail areas.  She was given Lotrisone cream at a visit with the PA in our clinic in February without any relief.  The patient is 25 years of age and does wish to become pregnant and does not want contraception at this time.  She has no irregularities to her periods  There is no prior history of hypertension or diabetes  On arrival blood pressure is 109/68 Health screening labs occurred in February she has normal kidney function normal liver function no evidence of diabetes She has previously been screened for HIV which have been negative  She declines any lab draws at this visit  The patient does not believe in vaccinations and declines the pneumonia vaccine and flu vaccine at this visit.  She has not received any COVID vaccines.  Patient does need Pap smear and agrees to receive this we will arrange this in our clinic with another provider    Below is a copy of the MRI report of the lumbar spine recently obtained  MRI lumbar spine: Lumbar spondylosis, as outlined and with findings most notably as follows.   At L5-S1, a broad-based central/right  subarticular disc extrusion with slight caudal migration contributes to severe right subarticular stenosis, encroaching upon the descending right S1 nerve root. It also contributes to mild left subarticular narrowing, and may contact the descending left S1 nerve root. Mild relative narrowing of the central canal. Multifactorial moderate bilateral neural foraminal narrowing.   At L4-L5, there is a sizable disc extrusion spanning the central and bilateral subarticular zones, and extending caudally to the left to the mid L5 vertebral body level. The disc extrusion contributes to severe left subarticular/lateral recess stenosis, encroaching upon multiple descending left-sided nerve roots (most notably the descending left L5 nerve root). The disc extrusion also contributes to moderate/severe right subarticular stenosis, encroaching upon the descending right L5 nerve root. Multifactorial mild-to-moderate narrowing of the central canal and bilateral neural foramina.   At L3-L4, a broad-based shallow central disc protrusion contributes to mild bilateral subarticular narrowing with slight crowding of the descending L4 nerve roots.   Lumbar dextrocurvature. The patient has an existing physical therapy referral and lumbar injection referral however she is yet to achieve any [place] discount she is encouraged to process financial assistance ASAP   Past Medical History:  Diagnosis Date   Arthritis    Asthma     Past Surgical History:  Procedure Laterality Date   ADENOIDECTOMY     TONSILLECTOMY     WISDOM TOOTH EXTRACTION      Family History  Problem Relation  Age of Onset   Diabetes Mother    Hypertension Mother    Hyperlipidemia Mother     Social History   Socioeconomic History   Marital status: Single    Spouse name: Not on file   Number of children: Not on file   Years of education: Not on file   Highest education level: Not on file  Occupational History   Not on file   Tobacco Use   Smoking status: Former    Packs/day: 0.25    Types: Cigarettes   Smokeless tobacco: Never  Substance and Sexual Activity   Alcohol use: No   Drug use: Yes    Types: Marijuana    Comment: denies any use currently   Sexual activity: Yes    Birth control/protection: None    Comment: last sex 23 Dec 2015  Other Topics Concern   Not on file  Social History Narrative   Not on file   Social Determinants of Health   Financial Resource Strain: Not on file  Food Insecurity: Not on file  Transportation Needs: Not on file  Physical Activity: Not on file  Stress: Not on file  Social Connections: Not on file  Intimate Partner Violence: Not on file    ROS Review of Systems  Constitutional:  Negative for chills, diaphoresis and fever.  HENT:  Negative for congestion, hearing loss, nosebleeds, sore throat and tinnitus.   Eyes:  Negative for photophobia and redness.  Respiratory:  Negative for cough, shortness of breath, wheezing and stridor.   Cardiovascular:  Negative for chest pain, palpitations and leg swelling.  Gastrointestinal:  Negative for abdominal pain, blood in stool, constipation, diarrhea, nausea and vomiting.  Endocrine: Negative for polydipsia.  Genitourinary:  Negative for dysuria, flank pain, frequency, hematuria and urgency.  Musculoskeletal:  Positive for back pain. Negative for myalgias and neck pain.  Skin:  Negative for rash.  Allergic/Immunologic: Negative for environmental allergies.  Neurological:  Negative for dizziness, tremors, seizures, weakness and headaches.  Hematological:  Does not bruise/bleed easily.  Psychiatric/Behavioral:  Negative for suicidal ideas. The patient is not nervous/anxious.    Objective:   Today's Vitals: BP 109/68   Pulse 61   Resp 16   Ht 5\' 7"  (1.702 m)   Wt 177 lb (80.3 kg)   SpO2 96%   BMI 27.72 kg/m   Physical Exam Vitals reviewed.  Constitutional:      Appearance: Normal appearance. She is  well-developed. She is not diaphoretic.  HENT:     Head: Normocephalic and atraumatic.     Nose: No nasal deformity, septal deviation, mucosal edema or rhinorrhea.     Right Sinus: No maxillary sinus tenderness or frontal sinus tenderness.     Left Sinus: No maxillary sinus tenderness or frontal sinus tenderness.     Mouth/Throat:     Pharynx: No oropharyngeal exudate.  Eyes:     General: No scleral icterus.    Conjunctiva/sclera: Conjunctivae normal.     Pupils: Pupils are equal, round, and reactive to light.  Neck:     Thyroid: No thyromegaly.     Vascular: No carotid bruit or JVD.     Trachea: Trachea normal. No tracheal tenderness or tracheal deviation.  Cardiovascular:     Rate and Rhythm: Normal rate and regular rhythm.     Chest Wall: PMI is not displaced.     Pulses: Normal pulses. No decreased pulses.     Heart sounds: Normal heart sounds, S1 normal and S2 normal.  Heart sounds not distant. No murmur heard. No systolic murmur is present.  No diastolic murmur is present.    No friction rub. No gallop. No S3 or S4 sounds.  Pulmonary:     Effort: No tachypnea, accessory muscle usage or respiratory distress.     Breath sounds: No stridor. No decreased breath sounds, wheezing, rhonchi or rales.  Chest:     Chest wall: No tenderness.  Abdominal:     General: Bowel sounds are normal. There is no distension.     Palpations: Abdomen is soft. Abdomen is not rigid.     Tenderness: There is no abdominal tenderness. There is no guarding or rebound.  Musculoskeletal:        General: Normal range of motion.     Cervical back: Normal range of motion and neck supple. No edema, erythema or rigidity. No muscular tenderness. Normal range of motion.     Comments: Tenderness paraspinal areas in the lumbar spine and positive straight leg raise sign on the left leg however strength in the left lower extremity right lower extremity are normal  Lymphadenopathy:     Head:     Right side of head:  No submental or submandibular adenopathy.     Left side of head: No submental or submandibular adenopathy.     Cervical: No cervical adenopathy.  Skin:    General: Skin is warm and dry.     Coloration: Skin is not pale.     Findings: No rash.     Nails: There is no clubbing.  Neurological:     General: No focal deficit present.     Mental Status: She is alert and oriented to person, place, and time. Mental status is at baseline.     Sensory: No sensory deficit.     Motor: No weakness.     Coordination: Coordination normal.     Gait: Gait normal.     Deep Tendon Reflexes: Reflexes normal.  Psychiatric:        Mood and Affect: Mood normal.        Speech: Speech normal.        Behavior: Behavior normal.        Thought Content: Thought content normal.        Judgment: Judgment normal.    Assessment & Plan:   Problem List Items Addressed This Visit       Nervous and Auditory   Lumbar radiculopathy    Lumbar radiculopathy involving lumbar L4-5 L5-S1  Will prescribe lidocaine patch to lower back and meloxicam refill which of been helpful in the past  Patient encouraged to apply for orange card [place] discount ASAP so she can obtain injections and also physical therapy.  She is interested in wanting to proceed with surgery soon as possible indicated to her that at her age would recommend the injections first        Musculoskeletal and Integument   Onychomycosis    Involving toenails again she would benefit from podiatry referral however until she gets the orange card this is not possible  Will provide Penlac toenail polish for now      Relevant Medications   ciclopirox (PENLAC) 8 % solution   clotrimazole (LOTRIMIN) 1 % cream   Tinea pedis    Again would benefit from podiatry referral but for now will prescribe clotrimazole cream applied with skin moisturizer to the feet bilaterally twice daily      Relevant Medications   ciclopirox (PENLAC) 8 %  solution    clotrimazole (LOTRIMIN) 1 % cream    Outpatient Encounter Medications as of 12/27/2020  Medication Sig   ciclopirox (PENLAC) 8 % solution Apply topically at bedtime. Apply over nail and surrounding skin. Apply daily over previous coat. After seven (7) days, may remove with alcohol and continue cycle.   clotrimazole (LOTRIMIN) 1 % cream Apply 1 application topically 2 (two) times daily.   lidocaine (LIDODERM) 5 % Place 1 patch onto the skin daily. Remove & Discard patch within 12 hours or as directed by MD   [DISCONTINUED] meloxicam (MOBIC) 15 MG tablet Take 1 tablet (15 mg total) by mouth daily.   meloxicam (MOBIC) 15 MG tablet Take 1 tablet (15 mg total) by mouth daily.   [DISCONTINUED] clotrimazole-betamethasone (LOTRISONE) cream Apply 1 application topically 2 (two) times daily. For 4 weeks to affected skin (Patient not taking: Reported on 12/27/2020)   [DISCONTINUED] clotrimazole-betamethasone (LOTRISONE) cream APPLY 1 APPLICATION TOPICALLY 2 (TWO) TIMES DAILY. FOR 4 WEEKS TO AFFECTED SKIN (Patient not taking: Reported on 12/27/2020)   No facility-administered encounter medications on file as of 12/27/2020.    Follow-up: Return in about 6 weeks (around 02/07/2021).   Asencion Noble, MD

## 2020-12-27 NOTE — Patient Instructions (Addendum)
Begin Penlac topical antifungal to the toenails applied at bedtime follow directions  Begin clotrimazole cream mixed with foot moisturizer cream apply twice daily to in between toes and throughout the foot  Refill on meloxicam sent to our pharmacy  Begin lidocaine patch to lower back daily take it off at bedtime  Please apply for the orange card [place] discount as soon as possible so we can get you to podiatry and follow-up with your orthopedic surgeon regarding the injections and further discussions regarding your back  Return Dr Delford Field 6 weeks

## 2020-12-28 DIAGNOSIS — B353 Tinea pedis: Secondary | ICD-10-CM | POA: Insufficient documentation

## 2020-12-28 DIAGNOSIS — B351 Tinea unguium: Secondary | ICD-10-CM | POA: Insufficient documentation

## 2020-12-28 DIAGNOSIS — M5416 Radiculopathy, lumbar region: Secondary | ICD-10-CM | POA: Insufficient documentation

## 2020-12-28 NOTE — Assessment & Plan Note (Signed)
Involving toenails again she would benefit from podiatry referral however until she gets the orange card this is not possible  Will provide Penlac toenail polish for now

## 2020-12-28 NOTE — Assessment & Plan Note (Signed)
Again would benefit from podiatry referral but for now will prescribe clotrimazole cream applied with skin moisturizer to the feet bilaterally twice daily

## 2020-12-28 NOTE — Assessment & Plan Note (Signed)
Lumbar radiculopathy involving lumbar L4-5 L5-S1  Will prescribe lidocaine patch to lower back and meloxicam refill which of been helpful in the past  Patient encouraged to apply for orange card [place] discount ASAP so she can obtain injections and also physical therapy.  She is interested in wanting to proceed with surgery soon as possible indicated to her that at her age would recommend the injections first

## 2021-01-02 ENCOUNTER — Ambulatory Visit: Payer: Medicaid Other

## 2021-01-03 ENCOUNTER — Other Ambulatory Visit: Payer: Self-pay

## 2021-01-10 ENCOUNTER — Other Ambulatory Visit: Payer: Self-pay

## 2021-01-10 ENCOUNTER — Ambulatory Visit: Payer: Self-pay | Attending: Critical Care Medicine

## 2021-01-23 ENCOUNTER — Telehealth: Payer: Self-pay | Admitting: Critical Care Medicine

## 2021-01-23 NOTE — Telephone Encounter (Signed)
Pt was sent a letter from financial dept. Inform them, that the application they submitted was incomplete, since they were missing some documentation at the time of the appointment, Pt need to reschedule and resubmit all new papers and application for [employer] and [place], P.S. old documents has been sent back by mail to the Pt and Pt. need to make a new appt. 

## 2021-01-30 ENCOUNTER — Telehealth: Payer: Self-pay | Admitting: Critical Care Medicine

## 2021-01-30 NOTE — Telephone Encounter (Signed)
If patient returns call please get her scheduled for a PAP smear with either. Dr. Laural Benes, Dr. Alvis Lemmings or Bertram Denver

## 2021-01-30 NOTE — Telephone Encounter (Signed)
-----   Message from Storm Frisk, MD sent at 12/28/2020  6:35 AM EDT ----- This pt needs to be scheduled with any provider at [facility] for a PAP smear pls

## 2021-02-22 ENCOUNTER — Telehealth: Payer: Self-pay | Admitting: Critical Care Medicine

## 2021-02-22 NOTE — Telephone Encounter (Signed)
Copied from CRM [id number]. Topic: Appointment Scheduling - Scheduling Inquiry for Clinic >> Feb 21, 2021 12:06 PM Misty Sanders A wrote: Reason for CRM: The patient would like to be seen for financial counseling   The patient would like to be seen for an orange card  Please contact further

## 2021-02-27 NOTE — Telephone Encounter (Signed)
I return Pt call, LVM to call back 

## 2021-03-06 ENCOUNTER — Ambulatory Visit: Payer: Medicaid Other

## 2021-03-21 ENCOUNTER — Telehealth: Payer: Self-pay | Admitting: Critical Care Medicine

## 2021-03-21 NOTE — Telephone Encounter (Signed)
Copied from CRM [id number]. Topic: General - Other >> Mar 21, 2021  2:17 PM Glean Salen wrote: Reason for CRM: patient asking for call back from [facility],. She wouldn't say why

## 2021-03-22 NOTE — Telephone Encounter (Signed)
I return Pt call, LVM to call me back on Monday to schedule a financial appt

## 2021-03-30 ENCOUNTER — Ambulatory Visit: Payer: Self-pay | Admitting: Podiatry

## 2021-04-03 ENCOUNTER — Telehealth: Payer: Self-pay | Admitting: Critical Care Medicine

## 2021-04-03 NOTE — Telephone Encounter (Signed)
Copied from CRM [id number]. Topic: General - Other >> Apr 03, 2021  1:03 PM Gaetana Michaelis A wrote: Reason for CRM: The patient has returned a missed call from Mr. Mikle Bosworth   Please contact further when possible

## 2021-04-03 NOTE — Telephone Encounter (Signed)
I return Pt call, unable to LVM

## 2021-04-27 ENCOUNTER — Ambulatory Visit: Payer: Self-pay | Admitting: Podiatry

## 2021-05-18 ENCOUNTER — Ambulatory Visit: Payer: Self-pay | Admitting: Podiatry

## 2021-05-22 ENCOUNTER — Ambulatory Visit: Payer: Self-pay | Admitting: Podiatry

## 2021-05-28 ENCOUNTER — Encounter: Payer: Self-pay | Admitting: *Deleted

## 2021-05-28 ENCOUNTER — Ambulatory Visit (INDEPENDENT_AMBULATORY_CARE_PROVIDER_SITE_OTHER): Payer: Self-pay | Admitting: Podiatry

## 2021-05-28 DIAGNOSIS — B353 Tinea pedis: Secondary | ICD-10-CM

## 2021-05-28 MED ORDER — KETOCONAZOLE 2 % EX CREA: 1.0000 "application " | TOPICAL_CREAM | Freq: Every day | CUTANEOUS | 2 refills | Status: DC

## 2021-05-28 MED ORDER — TERBINAFINE HCL 250 MG PO TABS: 250.0000 mg | ORAL_TABLET | Freq: Every day | ORAL | 0 refills | Status: AC

## 2021-05-28 NOTE — Patient Instructions (Signed)
Dry well between the toes ? ?Apply betadine (iodine ointment) between the toes ? ?Apply cotton balls between the toes to keep air flowing through ? ?Clean shower floors and tile with bleach cleaner weekly ? ?Spray antifungal spray in shoes weekly  ? ? ?

## 2021-06-02 ENCOUNTER — Encounter: Payer: Self-pay | Admitting: Podiatry

## 2021-06-02 NOTE — Progress Notes (Signed)
?  Subjective:  ?Patient ID: Misty Sanders, female    DOB: Apr 22, 1995,  MRN: [id number] ? ?Persistent fungus has been going on and off for 10 years has used topical creams before ? ?26 y.o. female presents with the above complaint. History confirmed with patient.  ? ?Objective:  ?Physical Exam: ?warm, good capillary refill, no trophic changes or ulcerative lesions, normal DP and PT pulses, normal sensory exam, and tinea pedis. ? ?Assessment:  ? ?1. Tinea pedis of both feet   ? ? ? ?Plan:  ?Patient was evaluated and treated and all questions answered. ? ?Discussed the etiology and treatment options for tinea pedis.  Discussed topical and oral treatment.  Recommended topical treatment with 2% ketoconazole cream and oral treatment with Lamisil.  This was sent to the patient's pharmacy.  Also discussed appropriate foot hygiene, use of antifungal spray such as Tinactin in shoes, as well as cleaning her foot surfaces such as showers and bathroom floors with bleach. ? ? ?Return if symptoms worsen or fail to improve.  ? ?

## 2021-08-17 ENCOUNTER — Other Ambulatory Visit: Payer: Self-pay

## 2021-08-17 ENCOUNTER — Encounter (HOSPITAL_BASED_OUTPATIENT_CLINIC_OR_DEPARTMENT_OTHER): Payer: Self-pay

## 2021-08-17 ENCOUNTER — Encounter (HOSPITAL_COMMUNITY): Payer: Self-pay

## 2021-08-17 ENCOUNTER — Emergency Department (HOSPITAL_COMMUNITY): Payer: No Typology Code available for payment source

## 2021-08-17 ENCOUNTER — Emergency Department (HOSPITAL_COMMUNITY)
Admission: EM | Admit: 2021-08-17 | Discharge: 2021-08-17 | Discharge status: Against Medical Advice | Payer: No Typology Code available for payment source | Attending: Emergency Medicine | Admitting: Emergency Medicine

## 2021-08-17 ENCOUNTER — Emergency Department (HOSPITAL_BASED_OUTPATIENT_CLINIC_OR_DEPARTMENT_OTHER)
Admission: EM | Admit: 2021-08-17 | Discharge: 2021-08-17 | Disposition: A | Discharge status: Home / Self Care | Payer: No Typology Code available for payment source | Source: Home / Self Care | Attending: Emergency Medicine | Admitting: Emergency Medicine

## 2021-08-17 DIAGNOSIS — J45901 Unspecified asthma with (acute) exacerbation: Secondary | ICD-10-CM | POA: Insufficient documentation

## 2021-08-17 DIAGNOSIS — Z7952 Long term (current) use of systemic steroids: Secondary | ICD-10-CM | POA: Insufficient documentation

## 2021-08-17 DIAGNOSIS — R0602 Shortness of breath: Secondary | ICD-10-CM | POA: Diagnosis present

## 2021-08-17 DIAGNOSIS — Z85828 Personal history of other malignant neoplasm of skin: Secondary | ICD-10-CM | POA: Insufficient documentation

## 2021-08-17 DIAGNOSIS — Z91040 Latex allergy status: Secondary | ICD-10-CM | POA: Insufficient documentation

## 2021-08-17 DIAGNOSIS — Z91010 Allergy to peanuts: Secondary | ICD-10-CM | POA: Insufficient documentation

## 2021-08-17 DIAGNOSIS — N179 Acute kidney failure, unspecified: Secondary | ICD-10-CM | POA: Diagnosis not present

## 2021-08-17 DIAGNOSIS — E876 Hypokalemia: Secondary | ICD-10-CM | POA: Insufficient documentation

## 2021-08-17 DIAGNOSIS — R059 Cough, unspecified: Secondary | ICD-10-CM | POA: Diagnosis not present

## 2021-08-17 DIAGNOSIS — J45909 Unspecified asthma, uncomplicated: Secondary | ICD-10-CM | POA: Insufficient documentation

## 2021-08-17 LAB — CBC WITH DIFFERENTIAL/PLATELET
Abs Immature Granulocytes: 0.04 10*3/uL (ref 0.00–0.07)
Basophils Absolute: 0 10*3/uL (ref 0.0–0.1)
Basophils Relative: 0 %
Eosinophils Absolute: 0.2 10*3/uL (ref 0.0–0.5)
Eosinophils Relative: 2 %
HCT: 37.8 % (ref 36.0–46.0)
Hemoglobin: 13 g/dL (ref 12.0–15.0)
Immature Granulocytes: 1 %
Lymphocytes Relative: 17 %
Lymphs Abs: 1.4 10*3/uL (ref 0.7–4.0)
MCH: 31.9 pg (ref 26.0–34.0)
MCHC: 34.4 g/dL (ref 30.0–36.0)
MCV: 92.6 fL (ref 80.0–100.0)
Monocytes Absolute: 0.5 10*3/uL (ref 0.1–1.0)
Monocytes Relative: 7 %
Neutro Abs: 6 10*3/uL (ref 1.7–7.7)
Neutrophils Relative %: 73 %
Platelets: 245 10*3/uL (ref 150–400)
RBC: 4.08 MIL/uL (ref 3.87–5.11)
RDW: 13.1 % (ref 11.5–15.5)
WBC: 8.1 10*3/uL (ref 4.0–10.5)
nRBC: 0 % (ref 0.0–0.2)

## 2021-08-17 LAB — BASIC METABOLIC PANEL
Anion gap: 10 (ref 5–15)
Anion gap: 10 (ref 5–15)
BUN: 9 mg/dL (ref 6–20)
BUN: 9 mg/dL (ref 6–20)
CO2: 19 mmol/L — ABNORMAL LOW (ref 22–32)
CO2: 19 mmol/L — ABNORMAL LOW (ref 22–32)
Calcium: 8.3 mg/dL — ABNORMAL LOW (ref 8.9–10.3)
Calcium: 9 mg/dL (ref 8.9–10.3)
Chloride: 109 mmol/L (ref 98–111)
Chloride: 110 mmol/L (ref 98–111)
Creatinine, Ser: 1.21 mg/dL — ABNORMAL HIGH (ref 0.44–1.00)
Creatinine, Ser: 1 mg/dL (ref 0.44–1.00)
GFR, Estimated: >60 mL/min (ref >60)
GFR, Estimated: >60 mL/min (ref >60)
Glucose, Bld: 147 mg/dL — ABNORMAL HIGH (ref 70–99)
Glucose, Bld: 120 mg/dL — ABNORMAL HIGH (ref 70–99)
Potassium: 2.8 mmol/L — ABNORMAL LOW (ref 3.5–5.1)
Potassium: 3.7 mmol/L (ref 3.5–5.1)
Sodium: 138 mmol/L (ref 135–145)
Sodium: 139 mmol/L (ref 135–145)

## 2021-08-17 LAB — MAGNESIUM: Magnesium: 1.7 mg/dL (ref 1.7–2.4)

## 2021-08-17 LAB — I-STAT BETA HCG BLOOD, ED (MC, WL, AP ONLY): I-stat hCG, quantitative: <5 m[IU]/mL (ref <5)

## 2021-08-17 LAB — D-DIMER, QUANTITATIVE: D-Dimer, Quant: <0.27 ug/mL-FEU (ref 0.00–0.50)

## 2021-08-17 MED ORDER — PREDNISONE 10 MG PO TABS: 50.0000 mg | ORAL_TABLET | Freq: Every day | ORAL | 0 refills | Status: AC

## 2021-08-17 MED ORDER — PREDNISONE 20 MG PO TABS
60.0000 mg | ORAL_TABLET | Freq: Once | ORAL | Status: AC
  Administered 2021-08-17: 60 mg via ORAL
  Filled 2021-08-17: qty 3

## 2021-08-17 MED ORDER — BENZONATATE 100 MG PO CAPS: 100.0000 mg | ORAL_CAPSULE | Freq: Three times a day (TID) | ORAL | 0 refills | Status: DC

## 2021-08-17 MED ORDER — MAGNESIUM SULFATE 2 GM/50ML IV SOLN
2.0000 g | Freq: Once | INTRAVENOUS | Status: AC
  Administered 2021-08-17: 2 g via INTRAVENOUS
  Filled 2021-08-17: qty 50

## 2021-08-17 MED ORDER — METHYLPREDNISOLONE SODIUM SUCC 125 MG IJ SOLR
125.0000 mg | Freq: Once | INTRAMUSCULAR | Status: AC
  Administered 2021-08-17: 125 mg via INTRAVENOUS
  Filled 2021-08-17: qty 2

## 2021-08-17 MED ORDER — IPRATROPIUM BROMIDE 0.02 % IN SOLN
0.5000 mg | Freq: Once | RESPIRATORY_TRACT | Status: AC
  Administered 2021-08-17: 0.5 mg via RESPIRATORY_TRACT
  Filled 2021-08-17: qty 2.5

## 2021-08-17 MED ORDER — IPRATROPIUM-ALBUTEROL 0.5-2.5 (3) MG/3ML IN SOLN
3.0000 mL | Freq: Once | RESPIRATORY_TRACT | Status: AC
  Administered 2021-08-17: 3 mL via RESPIRATORY_TRACT
  Filled 2021-08-17: qty 3

## 2021-08-17 MED ORDER — ALBUTEROL SULFATE (2.5 MG/3ML) 0.083% IN NEBU
10.0000 mg/h | INHALATION_SOLUTION | RESPIRATORY_TRACT | Status: DC
  Administered 2021-08-17: 10 mg/h via RESPIRATORY_TRACT
  Filled 2021-08-17: qty 12

## 2021-08-17 MED ORDER — DIPHENHYDRAMINE HCL 50 MG/ML IJ SOLN: 12.5000 mg | Freq: Once | INTRAMUSCULAR | Status: DC

## 2021-08-17 MED ORDER — PROMETHAZINE-DM 6.25-15 MG/5ML PO SYRP: 5.0000 mL | ORAL_SOLUTION | Freq: Four times a day (QID) | ORAL | 0 refills | Status: DC | PRN

## 2021-08-17 MED ORDER — SODIUM CHLORIDE 0.9 % IV BOLUS
1000.0000 mL | Freq: Once | INTRAVENOUS | Status: AC
  Administered 2021-08-17: 1000 mL via INTRAVENOUS

## 2021-08-17 MED ORDER — ALBUTEROL SULFATE (2.5 MG/3ML) 0.083% IN NEBU
15.0000 mg/h | INHALATION_SOLUTION | Freq: Once | RESPIRATORY_TRACT | Status: AC
  Administered 2021-08-17: 15 mg/h via RESPIRATORY_TRACT
  Filled 2021-08-17: qty 18

## 2021-08-17 NOTE — ED Triage Notes (Signed)
Shob and wheezing since yesterday morning. Multiple uses of albuterol with no improvement.   Audibile wheezing.

## 2021-08-17 NOTE — ED Notes (Signed)
Pt called staff to bedside for difficulty breathing, pt assessed, pt is breathing rapidly, appearing to have forced expiratory phase. No wheezing noted, pt able to speak in complete sentences, saturating 100% on room air while conversing w/ staff. EDP notified of vital signs and pt discomfort.

## 2021-08-17 NOTE — ED Notes (Signed)
Patient states she felt much better. Decreased wheezing noted. Ambulated with no increase in shortness of breath

## 2021-08-17 NOTE — ED Triage Notes (Signed)
Patient states she was seen at [facility] last night - Patient states they treated her with albuterol but her shortness of breath has not changed.  Patient believes she was given too much albuterol.   Patient speaking in full sentences.

## 2021-09-05 ENCOUNTER — Ambulatory Visit: Payer: 59 | Admitting: Podiatry

## 2021-09-19 ENCOUNTER — Ambulatory Visit (INDEPENDENT_AMBULATORY_CARE_PROVIDER_SITE_OTHER): Payer: No Typology Code available for payment source | Admitting: Podiatry

## 2021-09-19 DIAGNOSIS — Z91199 Patient's noncompliance with other medical treatment and regimen due to unspecified reason: Secondary | ICD-10-CM

## 2021-09-19 NOTE — Progress Notes (Signed)
Patient was no-show for appointment today 

## 2021-10-01 ENCOUNTER — Emergency Department (HOSPITAL_COMMUNITY)
Admission: EM | Admit: 2021-10-01 | Discharge: 2021-10-01 | Disposition: A | Discharge status: Home / Self Care | Payer: No Typology Code available for payment source | Attending: Emergency Medicine | Admitting: Emergency Medicine

## 2021-10-01 ENCOUNTER — Encounter (HOSPITAL_COMMUNITY): Payer: Self-pay | Admitting: Emergency Medicine

## 2021-10-01 DIAGNOSIS — Z91040 Latex allergy status: Secondary | ICD-10-CM | POA: Diagnosis not present

## 2021-10-01 DIAGNOSIS — L600 Ingrowing nail: Secondary | ICD-10-CM | POA: Diagnosis present

## 2021-10-01 DIAGNOSIS — B351 Tinea unguium: Secondary | ICD-10-CM

## 2021-10-01 DIAGNOSIS — Z91010 Allergy to peanuts: Secondary | ICD-10-CM | POA: Diagnosis not present

## 2021-10-01 DIAGNOSIS — M79674 Pain in right toe(s): Secondary | ICD-10-CM

## 2021-10-01 MED ORDER — LIDOCAINE HCL 2 % IJ SOLN
20.0000 mL | Freq: Once | INTRAMUSCULAR | Status: AC
  Administered 2021-10-01: 400 mg via INTRADERMAL
  Filled 2021-10-01: qty 20

## 2021-10-01 NOTE — ED Triage Notes (Signed)
Pt reports needing her right big toe nail removed. States it has been loose for 2 months. Unable to get appt with podiatrists.

## 2021-10-01 NOTE — ED Provider Notes (Signed)
[facility]-EMERGENCY DEPT Provider Note   CSN: [id number] Arrival date & time: 10/01/21  1214     History  Chief Complaint  Patient presents with   Nail Problem    Misty Sanders is a 26 y.o. female.  Patient presents with loose R great toe nail. She is requesting removal. States she had bad athletes foot, was seen by podiatry, and was given medicine to clear up the infection. She has a toenail that has an obvious infection which is becoming loose and causing pain with movement with walking due to movement of the nail.        Home Medications Prior to Admission medications   Medication Sig Start Date End Date Taking? Authorizing Provider  benzonatate (TESSALON) 100 MG capsule Take 1 capsule (100 mg total) by mouth every 8 (eight) hours. 08/17/21   Caccavale, Sophia, PA-C  ketoconazole (NIZORAL) 2 % cream Apply 1 application. topically daily. 05/28/21   McDonald, Rachelle Hora, DPM  lidocaine (LIDODERM) 5 % Place 1 patch onto the skin daily. Remove & Discard patch within 12 hours or as directed by MD 12/27/20   Storm Frisk, MD  meloxicam (MOBIC) 15 MG tablet Take 1 tablet (15 mg total) by mouth daily. 12/27/20   Storm Frisk, MD  promethazine-dextromethorphan (PROMETHAZINE-DM) 6.25-15 MG/5ML syrup Take 5 mLs by mouth 4 (four) times daily as needed for cough. 08/17/21   Caccavale, Sophia, PA-C      Allergies    Penicillins, Eggs or egg-derived products, Peanuts [peanut oil], Sulfa antibiotics, and Latex    Review of Systems   Review of Systems  Physical Exam Updated Vital Signs BP 107/79   Pulse 84   Temp 98.3 F (36.8 C) (Oral)   Resp 16   Ht 5\' 6"  (1.676 m)   Wt 84.4 kg   SpO2 100%   BMI 30.02 kg/m   Physical Exam Vitals and nursing note reviewed.  Constitutional:      Appearance: She is well-developed.  HENT:     Head: Normocephalic and atraumatic.  Eyes:     Conjunctiva/sclera: Conjunctivae normal.  Pulmonary:     Effort: No  respiratory distress.  Musculoskeletal:     Cervical back: Normal range of motion and neck supple.  Skin:    General: Skin is warm and dry.     Comments: Onychomycosis R great toe nail. Poorly adhered. I can raise the toenail from the medial margin.   Neurological:     Mental Status: She is alert.     ED Results / Procedures / Treatments   Labs (all labs ordered are listed, but only abnormal results are displayed) Labs Reviewed - No data to display  EKG None  Radiology No results found.  Procedures .Nail Removal  Date/Time: 10/01/2021 1:08 PM  Performed by: 12/01/2021, PA-C Authorized by: Renne Crigler, PA-C   Consent:    Consent obtained:  Verbal   Consent given by:  Patient   Risks discussed:  Bleeding, incomplete removal, infection, pain and permanent nail deformity   Alternatives discussed:  Alternative treatment and referral Universal protocol:    Patient identity confirmed:  Verbally with patient and provided demographic data Location:    Foot:  R big toe Pre-procedure details:    Skin preparation:  Povidone-iodine Anesthesia:    Anesthesia method:  Nerve block   Block needle gauge:  25 G   Block anesthetic:  Lidocaine 2% w/o epi   Block technique:  Ring block  Block injection procedure:  Anatomic landmarks identified, introduced needle, incremental injection, negative aspiration for blood and anatomic landmarks palpated   Block outcome:  Anesthesia achieved Nail Removal:    Nail removed:  Complete   Nail bed repaired: no   Post-procedure details:    Procedure completion:  Tolerated well, no immediate complications Comments:     There was a small part of the nail that was well adhered laterally and the nail was so brittle that it could not be removed in one whole piece. The vast majority of the nail was removed without difficultly. Mild trauma/bleeding from the lateral nail bed due to repeated attempts to remove residual nail. At end of procedure a very  small portion of nail plate remained.      Medications Ordered in ED Medications  lidocaine (XYLOCAINE) 2 % (with pres) injection 400 mg (has no administration in time range)    ED Course/ Medical Decision Making/ A&P    Patient seen and examined. History obtained directly from patient.   Labs/EKG: none ordered  Imaging: none ordered  Medications/Fluids: Ordered: Lidocaine 2%.   Most recent vital signs reviewed and are as follows: BP 107/79   Pulse 84   Temp 98.3 F (36.8 C) (Oral)   Resp 16   Ht 5\' 6"  (1.676 m)   Wt 84.4 kg   SpO2 100%   BMI 30.02 kg/m   Initial impression: Toenail in process of falling off 2/2 fungal infection.   Discussed risks at length. Discussed that if nail bed itself is disrupted, she may have irregular or non-growth of nail. Discussed risks of digital block including pain, bleeding, infection, nerve injury. Discussed removal in ED and associated risks vs seeking podiatry referral where this is much more commonly managed. Patient requests that we proceed today. Will perform digital block and remove nail.   1:12 PM Reassessment performed. Patient appears well. Tolerated procedure well.   Most current vital signs reviewed and are as follows: BP 107/79   Pulse 84   Temp 98.3 F (36.8 C) (Oral)   Resp 16   Ht 5\' 6"  (1.676 m)   Wt 84.4 kg   SpO2 100%   BMI 30.02 kg/m   Plan: Discharge to home.   Prescriptions written for: None  Other home care instructions discussed: Cover for the next 48 hrs while nailbed heals. Then keep protected as needed.   ED return instructions discussed: Pt urged to return with worsening pain, worsening swelling, expanding area of redness or streaking up extremity, fever, or any other concerns.   Follow-up instructions discussed: Follow-up with podiatry.                            Medical Decision Making Risk Prescription drug management.   Patient with toe pain from poorly adherent nail plate, ingrown  laterally. Nail removed per patient request to help with pain.         Final Clinical Impression(s) / ED Diagnoses Final diagnoses:  Ingrown right big toenail  Onychomycosis  Pain of toe of right foot    Rx / DC Orders ED Discharge Orders     None         , PA-C 10/01/21 1315    Renne Crigler, MD 10/02/21 1023

## 2021-11-26 ENCOUNTER — Telehealth: Payer: Self-pay | Admitting: Critical Care Medicine

## 2021-11-26 NOTE — Telephone Encounter (Signed)
Pt is calling to request an appt for a TB skin test this week. Please advise CB-  [phone]

## 2021-11-26 NOTE — Telephone Encounter (Signed)
Attempt to call patient back to schedule apt.  Unable to leave voicemail.

## 2021-11-27 ENCOUNTER — Ambulatory Visit: Payer: No Typology Code available for payment source | Attending: Critical Care Medicine

## 2021-11-27 DIAGNOSIS — Z111 Encounter for screening for respiratory tuberculosis: Secondary | ICD-10-CM | POA: Diagnosis not present

## 2021-11-27 NOTE — Progress Notes (Signed)
PPD placed in left arm  Pt to return 48-72 hours for reading.

## 2021-11-27 NOTE — Telephone Encounter (Signed)
Pt has been placed on schedule

## 2021-11-29 ENCOUNTER — Ambulatory Visit: Payer: No Typology Code available for payment source | Attending: Critical Care Medicine

## 2021-11-29 LAB — TB SKIN TEST
Induration: 0 mm
TB Skin Test: NEGATIVE

## 2021-11-29 NOTE — Progress Notes (Signed)
I will have to do this form next week since result came late today

## 2021-12-06 ENCOUNTER — Telehealth: Payer: Self-pay

## 2021-12-06 NOTE — Telephone Encounter (Signed)
Called patient first number did not work/ invalid [phone]) called second number on file which is her mothers([phone]) left a message about giving [place] a call back. Pt mother verbalized understanding.    *I will leave paperwork at the front*

## 2022-04-15 ENCOUNTER — Other Ambulatory Visit (HOSPITAL_COMMUNITY)
Admission: RE | Admit: 2022-04-15 | Discharge: 2022-04-15 | Disposition: A | Discharge status: Home / Self Care | Payer: No Typology Code available for payment source | Source: Ambulatory Visit | Attending: Physician Assistant | Admitting: Physician Assistant

## 2022-04-15 ENCOUNTER — Ambulatory Visit: Payer: No Typology Code available for payment source | Admitting: Physician Assistant

## 2022-04-15 ENCOUNTER — Encounter: Payer: Self-pay | Admitting: Physician Assistant

## 2022-04-15 VITALS — BP 111/67 | HR 73 | Ht 67.0 in | Wt 188.0 lb

## 2022-04-15 DIAGNOSIS — Z113 Encounter for screening for infections with a predominantly sexual mode of transmission: Secondary | ICD-10-CM | POA: Diagnosis present

## 2022-04-15 DIAGNOSIS — A599 Trichomoniasis, unspecified: Secondary | ICD-10-CM

## 2022-04-15 DIAGNOSIS — N760 Acute vaginitis: Secondary | ICD-10-CM | POA: Diagnosis not present

## 2022-04-15 DIAGNOSIS — B9689 Other specified bacterial agents as the cause of diseases classified elsewhere: Secondary | ICD-10-CM

## 2022-04-15 NOTE — Patient Instructions (Signed)
We will call you with today's lab results.  Kennieth Rad, PA-C Physician Assistant [place] [email or web address]   Safe Sex Practicing safe sex means taking steps before and during sex to reduce your risk of: Getting an STI (sexually transmitted infection). Giving your partner an STI. Unwanted or unplanned pregnancy. How to practice safe sex Ways you can practice safe sex  Limit your sexual partners to only one partner who is having sex with only you. Avoid using alcohol and drugs before having sex. Alcohol and drugs can affect your judgment. Before having sex with a new partner: Talk to your partner about past partners, past STIs, and drug use. Get screened for STIs and discuss the results with your partner. Ask your partner to get screened too. Check your body regularly for sores, blisters, rashes, or unusual discharge. If you notice any of these problems, visit your health care provider. Avoid sexual contact if you have symptoms of an infection or you are being treated for an STI. While having sex, use a condom. Make sure to: Use a condom every time you have vaginal, oral, or anal sex. Both females and males should wear condoms during oral sex. Keep condoms in place from the beginning to the end of sexual activity. Use a latex condom, if possible. Latex condoms offer the best protection. Use only water-based lubricants with a condom. Using petroleum-based lubricants or oils will weaken the condom and increase the chance that it will break. Ways your health care provider can help you practice safe sex  See your health care provider for regular screenings, exams, and tests for STIs. Talk with your health care provider about what kind of birth control (contraception) is best for you. Get vaccinated against hepatitis B and human papillomavirus (HPV). If you are at risk of being infected with HIV (human immunodeficiency virus),  talk with your health care provider about taking a prescription medicine to prevent HIV infection. You are at risk for HIV if you: Are a man who has sex with other men. Are sexually active with more than one partner. Take drugs by injection. Have a sex partner who has HIV. Have unprotected sex. Have sex with someone who has sex with both men and women. Have had an STI. Follow these instructions at home: Take over-the-counter and prescription medicines only as told by your health care provider. Keep all follow-up visits. This is important. Where to find more information Centers for Disease Control and Prevention: http://www.wolf.info/ Planned Parenthood: www.plannedparenthood.org Office on [employer] [facility]: [place] Summary Practicing safe sex means taking steps before and during sex to reduce your risk getting an STI, giving your partner an STI, and having an unwanted or unplanned pregnancy. Before having sex with a new partner, talk to your partner about past partners, past STIs, and drug use. Use a condom every time you have vaginal, oral, or anal sex. Both females and males should wear condoms during oral sex. Check your body regularly for sores, blisters, rashes, or unusual discharge. If you notice any of these problems, visit your health care provider. See your health care provider for regular screenings, exams, and tests for STIs. This information is not intended to replace advice given to you by your health care provider. Make sure you discuss any questions you have with your health care provider. Document Revised: 07/19/2019 Document Reviewed: 07/19/2019 Elsevier Patient Education  [employer].

## 2022-04-15 NOTE — Progress Notes (Signed)
Established Patient Office Visit  Subjective   Patient ID: Misty Sanders, female    DOB: 02/14/1996  Age: 27 y.o. MRN: [id number]  Chief Complaint  Patient presents with   Std Screening     Requests STD screening.  Denies any STI symptoms or known exposure.  Does endorse that she has a bump in her genital area that she noticed yesterday, nontender, does not burn or itch.  States that she is unsure if it is from shaving.  Patient self report negative Pap within past  year.  No other concerns at this time.   Past Medical History:  Diagnosis Date   Arthritis    Asthma    Social History   Socioeconomic History   Marital status: Single    Spouse name: Not on file   Number of children: Not on file   Years of education: Not on file   Highest education level: Not on file  Occupational History   Not on file  Tobacco Use   Smoking status: Former    Packs/day: 0.25    Types: Cigarettes   Smokeless tobacco: Never  Substance and Sexual Activity   Alcohol use: No   Drug use: Yes    Types: Marijuana    Comment: denies any use currently   Sexual activity: Yes    Birth control/protection: None    Comment: last sex 23 Dec 2015  Other Topics Concern   Not on file  Social History Narrative   Not on file   Social Determinants of Health   Financial Resource Strain: Not on file  Food Insecurity: Not on file  Transportation Needs: Not on file  Physical Activity: Not on file  Stress: Not on file  Social Connections: Not on file  Intimate Partner Violence: Not on file   Family History  Problem Relation Age of Onset   Diabetes Mother    Hypertension Mother    Hyperlipidemia Mother    Allergies  Allergen Reactions   Penicillins Anaphylaxis and Other (See Comments)    Has patient had a PCN reaction causing immediate rash, facial/tongue/throat swelling, SOB or lightheadedness with hypotension: Yes Has patient had a PCN reaction causing severe rash involving mucus  membranes or skin necrosis: No Has patient had a PCN reaction that required hospitalization: No Has patient had a PCN reaction occurring within the last 10 years: No If all of the above answers are "NO", then may proceed with Cephalosporin use.   Eggs Or Egg-Derived Products Itching and Swelling   Peanuts [Peanut Oil] Hives   Sulfa Antibiotics Hives   Latex Rash    Review of Systems  Constitutional:  Negative for chills and fever.  HENT: Negative.    Eyes: Negative.   Respiratory:  Negative for shortness of breath.   Cardiovascular:  Negative for chest pain.  Gastrointestinal: Negative.   Genitourinary:  Negative for dysuria, frequency and hematuria.  Musculoskeletal: Negative.   Skin: Negative.   Neurological: Negative.   Endo/Heme/Allergies: Negative.   Psychiatric/Behavioral: Negative.        Objective:     BP 111/67   Pulse 73   Ht 5' 7"$  (1.702 m)   Wt 188 lb (85.3 kg)   LMP 04/08/2022   SpO2 98%   BMI 29.44 kg/m    Physical Exam Vitals and nursing note reviewed.  Constitutional:      Appearance: Normal appearance.  HENT:     Head: Normocephalic and atraumatic.     Right Ear: External  ear normal.     Left Ear: External ear normal.     Nose: Nose normal.     Mouth/Throat:     Mouth: Mucous membranes are moist.     Pharynx: Oropharynx is clear.  Eyes:     Extraocular Movements: Extraocular movements intact.     Conjunctiva/sclera: Conjunctivae normal.     Pupils: Pupils are equal, round, and reactive to light.  Cardiovascular:     Rate and Rhythm: Normal rate and regular rhythm.     Pulses: Normal pulses.     Heart sounds: Normal heart sounds.  Pulmonary:     Effort: Pulmonary effort is normal.     Breath sounds: Normal breath sounds.  Musculoskeletal:        General: Normal range of motion.     Cervical back: Normal range of motion.  Skin:    General: Skin is warm and dry.  Neurological:     General: No focal deficit present.     Mental Status:  She is alert and oriented to person, place, and time.  Psychiatric:        Mood and Affect: Mood normal.        Behavior: Behavior normal.        Thought Content: Thought content normal.        Judgment: Judgment normal.        Assessment & Plan:   Problem List Items Addressed This Visit   None Visit Diagnoses     Screen for STD (sexually transmitted disease)    -  Primary   Relevant Orders   RPR   HIV antibody (with reflex)   Cervicovaginal ancillary only     1. Screen for STD (sexually transmitted disease) Patient eduction given on safe sex practices. - RPR - HIV antibody (with reflex) - Cervicovaginal ancillary only   I have reviewed the patient's medical history (PMH, PSH, Social History, Family History, Medications, and allergies) , and have been updated if relevant. I spent 20 minutes reviewing chart and  face to face time with patient.   Return if symptoms worsen or fail to improve.    Loraine Grip Mayers, PA-C

## 2022-04-16 LAB — CERVICOVAGINAL ANCILLARY ONLY
Bacterial Vaginitis (gardnerella): POSITIVE — AB
Candida Glabrata: NEGATIVE
Candida Vaginitis: NEGATIVE
Chlamydia: NEGATIVE
Comment: NEGATIVE
Comment: NEGATIVE
Comment: NEGATIVE
Comment: NEGATIVE
Comment: NEGATIVE
Comment: NORMAL
Neisseria Gonorrhea: NEGATIVE
Trichomonas: POSITIVE — AB

## 2022-04-16 LAB — RPR: RPR Ser Ql: NONREACTIVE

## 2022-04-16 LAB — HIV ANTIBODY (ROUTINE TESTING W REFLEX): HIV Screen 4th Generation wRfx: NONREACTIVE

## 2022-04-17 MED ORDER — METRONIDAZOLE 500 MG PO TABS: 500.0000 mg | ORAL_TABLET | Freq: Two times a day (BID) | ORAL | 0 refills | Status: AC

## 2022-04-17 NOTE — Addendum Note (Signed)
Addended by: Kennieth Rad on: 04/17/2022 08:31 AM   Modules accepted: Orders

## 2022-04-23 ENCOUNTER — Encounter: Payer: Self-pay | Admitting: Critical Care Medicine

## 2022-04-23 ENCOUNTER — Telehealth: Payer: Self-pay | Admitting: Critical Care Medicine

## 2022-04-23 NOTE — Telephone Encounter (Signed)
Yes I can do that

## 2022-04-23 NOTE — Telephone Encounter (Signed)
Do we do letter like this ?

## 2022-04-23 NOTE — Telephone Encounter (Signed)
Copied from [place] [id number]. 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 She believes her health will be compromised and she is fearful of receiving the vaccine. Please advise

## 2022-04-24 ENCOUNTER — Telehealth: Payer: Self-pay

## 2022-04-24 NOTE — Telephone Encounter (Signed)
Pt asked if this letter can be emailed to her or placed in her mychart / her email is [email or web address] please advise

## 2022-04-24 NOTE — Telephone Encounter (Signed)
Called patient to let her know that letter is ready for and will be at the the front. Patient is aware and will pick up .

## 2022-04-24 NOTE — Telephone Encounter (Signed)
Patient want letter emailed.

## 2022-04-25 NOTE — Telephone Encounter (Signed)
Letter sent through email

## 2022-05-02 ENCOUNTER — Other Ambulatory Visit (HOSPITAL_COMMUNITY)
Admission: RE | Admit: 2022-05-02 | Discharge: 2022-05-02 | Disposition: A | Discharge status: Home / Self Care | Payer: No Typology Code available for payment source | Source: Ambulatory Visit | Attending: Critical Care Medicine | Admitting: Critical Care Medicine

## 2022-05-02 ENCOUNTER — Encounter: Payer: Self-pay | Admitting: Emergency Medicine

## 2022-05-02 ENCOUNTER — Ambulatory Visit: Payer: Medicaid Other | Attending: Critical Care Medicine

## 2022-05-02 DIAGNOSIS — A64 Unspecified sexually transmitted disease: Secondary | ICD-10-CM

## 2022-05-02 NOTE — Progress Notes (Signed)
Patient in today for STD retesting

## 2022-05-03 ENCOUNTER — Ambulatory Visit: Payer: No Typology Code available for payment source

## 2022-05-03 LAB — CERVICOVAGINAL ANCILLARY ONLY
Bacterial Vaginitis (gardnerella): POSITIVE — AB
Candida Glabrata: NEGATIVE
Candida Vaginitis: NEGATIVE
Chlamydia: NEGATIVE
Comment: NEGATIVE
Comment: NEGATIVE
Comment: NEGATIVE
Comment: NEGATIVE
Comment: NEGATIVE
Comment: NORMAL
Neisseria Gonorrhea: NEGATIVE
Trichomonas: NEGATIVE

## 2022-05-06 ENCOUNTER — Other Ambulatory Visit: Payer: Self-pay | Admitting: Critical Care Medicine

## 2022-05-06 ENCOUNTER — Other Ambulatory Visit: Payer: Self-pay

## 2022-05-06 ENCOUNTER — Telehealth: Payer: Self-pay

## 2022-05-06 MED ORDER — METRONIDAZOLE 500 MG PO TABS
500.0000 mg | ORAL_TABLET | Freq: Two times a day (BID) | ORAL | 0 refills | Status: AC
  Filled 2022-05-06: qty 14, 7d supply, fill #0

## 2022-05-06 NOTE — Progress Notes (Signed)
Let pt know cerv vag positive for bacterial vaginosis  I sent Rx to our  pharmacy

## 2022-05-06 NOTE — Telephone Encounter (Signed)
Pt called for lab results. Shared provider's note.  Pt states she may call back with additional questions. Baird Kay, RN 05/06/2022 10:05 AM EDT Back to Top    Call placed to patient unable to reach message left on VM. Called was to informed patient per PCP"Let pt know cerv vag positive for bacterial vaginosis  I sent Rx to our  pharmacy"   Elsie Stain, MD 05/06/2022  4:59 AM EDT     Let pt know cerv vag positive for bacterial vaginosis  I sent Rx to our  pharmacy

## 2022-05-08 ENCOUNTER — Other Ambulatory Visit: Payer: Self-pay

## 2022-06-11 ENCOUNTER — Ambulatory Visit: Payer: No Typology Code available for payment source | Admitting: Physician Assistant

## 2022-06-11 ENCOUNTER — Encounter: Payer: Self-pay | Admitting: Physician Assistant

## 2022-06-11 ENCOUNTER — Other Ambulatory Visit (HOSPITAL_COMMUNITY)
Admission: RE | Admit: 2022-06-11 | Discharge: 2022-06-11 | Disposition: A | Discharge status: Home / Self Care | Payer: No Typology Code available for payment source | Source: Ambulatory Visit | Attending: Physician Assistant | Admitting: Physician Assistant

## 2022-06-11 VITALS — BP 99/68 | HR 73 | Resp 18 | Ht 67.0 in | Wt 181.0 lb

## 2022-06-11 DIAGNOSIS — Z113 Encounter for screening for infections with a predominantly sexual mode of transmission: Secondary | ICD-10-CM | POA: Diagnosis not present

## 2022-06-11 DIAGNOSIS — Z8619 Personal history of other infectious and parasitic diseases: Secondary | ICD-10-CM

## 2022-06-11 DIAGNOSIS — Z3202 Encounter for pregnancy test, result negative: Secondary | ICD-10-CM

## 2022-06-11 HISTORY — DX: Personal history of other infectious and parasitic diseases: Z86.19

## 2022-06-11 LAB — POCT URINALYSIS DIP (CLINITEK)
Bilirubin, UA: NEGATIVE
Glucose, UA: NEGATIVE mg/dL
Ketones, POC UA: NEGATIVE mg/dL
Nitrite, UA: NEGATIVE
POC PROTEIN,UA: NEGATIVE
Spec Grav, UA: >=1.03 — AB (ref 1.010–1.025)
Urobilinogen, UA: 0.2 E.U./dL
pH, UA: 6.5 (ref 5.0–8.0)

## 2022-06-11 LAB — POCT URINE PREGNANCY: Preg Test, Ur: NEGATIVE

## 2022-06-11 MED ORDER — METRONIDAZOLE 500 MG PO TABS: 500.0000 mg | ORAL_TABLET | Freq: Two times a day (BID) | ORAL | 0 refills | Status: AC

## 2022-06-11 NOTE — Progress Notes (Signed)
Pt has had drink today but no food.

## 2022-06-11 NOTE — Patient Instructions (Signed)
Trichomoniasis Trichomoniasis is a sexually transmitted infection (STI). Many people with trichomoniasis do not have any symptoms (are asymptomatic) or have only minimal symptoms. Untreated trichomoniasis can last from months to years. This condition is treated with medicine. What are the causes? This condition is caused by a parasite called Trichomonas vaginalis and is transmitted during sexual contact. What increases the risk? The following factors may make you more likely to develop this condition: Having unprotected sex. Having sex with a partner who has trichomoniasis. Having multiple sexual partners. Having had previous trichomoniasis infections or other STIs. What are the signs or symptoms? In females, symptoms of trichomoniasis include: Itching, burning, redness, or soreness in the genital area. Discomfort while urinating. Abnormal vaginal discharge that is clear, white, gray, or yellow-green and foamy and has an unusual fishy odor. In males, symptoms of trichomoniasis include: Discharge from the penis. Burning after urination or ejaculation. Itching or discomfort inside the penis. How is this diagnosed? This condition is diagnosed based on tests. To perform a test, your health care provider will do one of the following: Ask you to provide a urine sample. Take a sample of discharge. The sample may be taken from the vagina or cervix in females and from the urethra in males. Your health care provider may use a swab to collect the sample. Your health care provider may test you for other STIs, including human immunodeficiency virus (HIV). How is this treated?  This condition is treated with medicines such as metronidazole or tinidazole. These are called antimicrobial medicines, and they are taken by mouth (orally). Your sexual partner or partners also need to be tested and treated. If they have the infection and are not treated, you will likely get reinfected. If you plan to become  pregnant or think you may be pregnant, tell your health care provider right away. Some medicines that are used to treat the infection should not be taken during pregnancy. Your health care provider may recommend over-the-counter medicines or creams to help relieve itching or irritation. You may be tested for the infection again 3 months after treatment. Follow these instructions at home: Medicines Take over-the-counter and prescription medicines only as told by your health care provider. Take your antimicrobial medicine as told by your health care provider. Do not stop taking it even if you start to feel better. Use creams as told by your health care provider. General instructions Do not have sex until after you finish your medicine and your symptoms have resolved. Do not wear tampons while you have the infection (if you are female). Talk with your sexual partner or partners about any symptoms that either of you may have, as well as any history of STIs. Keep all follow-up visits. This is important. How is this prevented?  Use condoms every time you have sex. Using condoms correctly and consistently can help protect against STIs. Do not have sexual contact if you have symptoms of trichomoniasis or another STI. Avoid having multiple sexual partners. Get tested for STIs before you have sex with a partner. Ask all partners to do the same. Do not douche (if you are female). Douching may increase your risk for getting STIs due to the removal of good bacteria in the vagina. Contact a health care provider if: You still have symptoms after you finish your medicine. You develop a rash. You plan to become pregnant or think you may be pregnant. Summary Trichomoniasis is a sexually transmitted infection (STI). This condition often has no symptoms or minimal   symptoms. Take your antimicrobial medicine as told by your health care provider. Do not stop taking even if you start to feel better. Discuss your  infection with your sexual partner or partners. Make sure that all partners get tested and treated, if necessary. You should not have sex until after you finish your medicine and your symptoms have resolved. Keep all follow-up visits. This is important. This information is not intended to replace advice given to you by your health care provider. Make sure you discuss any questions you have with your health care provider. Document Revised: 01/10/2021 Document Reviewed: 01/10/2021 Elsevier Patient Education  2023 Elsevier Inc.  

## 2022-06-11 NOTE — Progress Notes (Signed)
Patient ID: Misty Sanders, female   DOB: 1995/04/30, 27 y.o.   MRN: [id number]   Misty Sanders, is a 27 y.o. female  NWG:[id number]  VHQ:[id number]  DOB - 10-15-1995  Chief Complaint  Patient presents with   Abdominal Pain    Lower abdomen for 2-3 days       Subjective:   Misty Sanders is a 27 y.o. female here today for STD testing.  H/o trich in February.  She was treated and neg f/up test in March.  She is uncertain if partner got treated(they said they did).  She has a 3 day h/o thin vaginal discharge and some urinary frequency and burning.  Wants to be covered for trich.  No fever.  Mild cramping.    Tripped on stairs a few weeks ago.  Says it was an accident and does not happen regularly.  No LOC   No problems updated.  ALLERGIES: Allergies  Allergen Reactions   Penicillins Anaphylaxis and Other (See Comments)    Has patient had a PCN reaction causing immediate rash, facial/tongue/throat swelling, SOB or lightheadedness with hypotension: Yes Has patient had a PCN reaction causing severe rash involving mucus membranes or skin necrosis: No Has patient had a PCN reaction that required hospitalization: No Has patient had a PCN reaction occurring within the last 10 years: No If all of the above answers are "NO", then may proceed with Cephalosporin use.   Egg-Derived Products Itching and Swelling   Peanuts [Peanut Oil] Hives   Sulfa Antibiotics Hives   Latex Rash    PAST MEDICAL HISTORY: Past Medical History:  Diagnosis Date   Arthritis    Asthma     MEDICATIONS AT HOME: Prior to Admission medications   Medication Sig Start Date End Date Taking? Authorizing Provider  metroNIDAZOLE (FLAGYL) 500 MG tablet Take 1 tablet (500 mg total) by mouth 2 (two) times daily for 7 days. 06/11/22 06/18/22 Yes Lema Heinkel, Marzella Schlein, PA-C  benzonatate (TESSALON) 100 MG capsule Take 1 capsule (100 mg total) by mouth every 8 (eight) hours. Patient not taking: Reported on 06/11/2022  08/17/21   Caccavale, Sophia, PA-C  ketoconazole (NIZORAL) 2 % cream Apply 1 application. topically daily. Patient not taking: Reported on 06/11/2022 05/28/21   Edwin Cap, DPM  lidocaine (LIDODERM) 5 % Place 1 patch onto the skin daily. Remove & Discard patch within 12 hours or as directed by MD Patient not taking: Reported on 06/11/2022 12/27/20   Storm Frisk, MD  meloxicam (MOBIC) 15 MG tablet Take 1 tablet (15 mg total) by mouth daily. Patient not taking: Reported on 06/11/2022 12/27/20   Storm Frisk, MD  promethazine-dextromethorphan (PROMETHAZINE-DM) 6.25-15 MG/5ML syrup Take 5 mLs by mouth 4 (four) times daily as needed for cough. Patient not taking: Reported on 06/11/2022 08/17/21   Caccavale, Sophia, PA-C    ROS: Neg HEENT Neg resp Neg cardiac Neg GI Neg MS Neg psych Neg neuro  Objective:   Vitals:   06/11/22 1021 06/11/22 1022  BP:  99/68  Pulse:  73  Resp:  18  SpO2:  99%  Weight: 181 lb (82.1 kg)   Height:  (1.702 m)    Exam General appearance : Awake, alert, not in any distress. Speech Clear. Not toxic looking HEENT: Atraumatic and Normocephalic Neck: Supple, no JVD. No cervical lymphadenopathy.  Chest: Good air entry bilaterally, CTAB.  No rales/rhonchi/wheezing CVS: S1 S2 regular, no murmurs.  Abdomen: soft and non tender Extremities: B/L Lower Ext shows  no edema, both legs are warm to touch Neurology: Awake alert, and oriented X 3, CN II-XII intact, Non focal Skin: No Rash  Data Review No results found for: "HGBA1C"  Assessment & Plan   1. H/O trichomoniasis Will cover.  Partner needs treatment - POCT URINALYSIS DIP (CLINITEK) - Cervicovaginal ancillary only - POCT urine pregnancy - metroNIDAZOLE (FLAGYL) 500 MG tablet; Take 1 tablet (500 mg total) by mouth 2 (two) times daily for 7 days.  Dispense: 14 tablet; Refill: 0 - Urine Culture Safe sex precautions.  She is not interested in [place] today  2. Screen for STD (sexually transmitted  disease) - Cervicovaginal ancillary only - POCT urine pregnancy    Return if symptoms worsen or fail to improve.  The patient was given clear instructions to go to ER or return to medical center if symptoms don't improve, worsen or new problems develop. The patient verbalized understanding. The patient was told to call to get lab results if they haven't heard anything in the next week.      Georgian Co, PA-C [facility] and [facility] [place], [place] [phone]   06/11/2022, 10:38 AM

## 2022-06-12 ENCOUNTER — Encounter: Payer: Self-pay | Admitting: Physician Assistant

## 2022-06-12 LAB — CERVICOVAGINAL ANCILLARY ONLY
Bacterial Vaginitis (gardnerella): POSITIVE — AB
Candida Glabrata: NEGATIVE
Candida Vaginitis: NEGATIVE
Chlamydia: NEGATIVE
Comment: NEGATIVE
Comment: NEGATIVE
Comment: NEGATIVE
Comment: NEGATIVE
Comment: NEGATIVE
Comment: NORMAL
Neisseria Gonorrhea: NEGATIVE
Trichomonas: NEGATIVE

## 2022-06-13 LAB — URINE CULTURE

## 2022-07-02 ENCOUNTER — Telehealth: Payer: Self-pay | Admitting: Critical Care Medicine

## 2022-07-02 NOTE — Telephone Encounter (Signed)
Can Misty be done?

## 2022-07-02 NOTE — Telephone Encounter (Signed)
Copied from CRM [id number]. Topic: General - Other >> Jul 02, 2022  9:23 AM Clide Dales wrote: Patient is requesting a note from Dr. Delford Field stating that she experience bad period cramps and my need to be excused from work during those times. Please advise.

## 2022-07-02 NOTE — Telephone Encounter (Signed)
She will need an office visit I have not seen her since 2022 and she did see [facility] in April but did not complain of this condition I cannot send a letter till I see her  A video visit would be adequate

## 2022-07-04 ENCOUNTER — Encounter: Payer: Self-pay | Admitting: Physician Assistant

## 2022-07-04 ENCOUNTER — Other Ambulatory Visit (HOSPITAL_COMMUNITY): Payer: Self-pay

## 2022-07-04 ENCOUNTER — Ambulatory Visit: Payer: No Typology Code available for payment source | Attending: Physician Assistant | Admitting: Physician Assistant

## 2022-07-04 ENCOUNTER — Other Ambulatory Visit: Payer: Self-pay | Admitting: Physician Assistant

## 2022-07-04 VITALS — BP 104/68 | HR 68 | Wt 185.8 lb

## 2022-07-04 DIAGNOSIS — B351 Tinea unguium: Secondary | ICD-10-CM | POA: Diagnosis not present

## 2022-07-04 DIAGNOSIS — L708 Other acne: Secondary | ICD-10-CM | POA: Diagnosis not present

## 2022-07-04 DIAGNOSIS — N946 Dysmenorrhea, unspecified: Secondary | ICD-10-CM

## 2022-07-04 DIAGNOSIS — B353 Tinea pedis: Secondary | ICD-10-CM | POA: Diagnosis not present

## 2022-07-04 MED ORDER — CLINDAMYCIN PHOS-BENZOYL PEROX 1-5 % EX GEL
CUTANEOUS | 3 refills | Status: DC
  Filled 2022-07-04: qty 25, 25d supply, fill #0

## 2022-07-04 MED ORDER — CLINDAMYCIN PHOS-BENZOYL PEROX 1.2-5 % EX GEL
CUTANEOUS | 0 refills | Status: DC
  Filled 2022-07-04: qty 45, 30d supply, fill #0

## 2022-07-04 NOTE — Progress Notes (Signed)
Patient ID: Misty Sanders, female   DOB: 1996-01-26, 27 y.o.   MRN: [id number]   Misty Sanders, is a 27 y.o. female  NWG:[id number]  VHQ:[id number]  DOB - [birth year]  Chief Complaint  Patient presents with   Dysmenorrhea   Acne       Subjective:   Misty Sanders is a 27 y.o. female here today for complaints of very painful periods.  The periods only last about 3 days but 1-2 days can be extremely painful.  She has not had a gynecology work up.  Not using BC and not interested in being on it.   C/o acne.  Also wants to see podiatry bc she says lamisil and ketoconazole did not help athlete's foot or toenail fungus.  Also, she c/o blisters from the boots she has to wear for work as well as excessive sweating.     No problems updated.  ALLERGIES: Allergies  Allergen Reactions   Penicillins Anaphylaxis and Other (See Comments)    Has patient had a PCN reaction causing immediate rash, facial/tongue/throat swelling, SOB or lightheadedness with hypotension: Yes Has patient had a PCN reaction causing severe rash involving mucus membranes or skin necrosis: No Has patient had a PCN reaction that required hospitalization: No Has patient had a PCN reaction occurring within the last 10 years: No If all of the above answers are "NO", then may proceed with Cephalosporin use.   Egg-Derived Products Itching and Swelling   Peanuts [Peanut Oil] Hives   Sulfa Antibiotics Hives   Latex Rash    PAST MEDICAL HISTORY: Past Medical History:  Diagnosis Date   Arthritis    Asthma     MEDICATIONS AT HOME: Prior to Admission medications   Medication Sig Start Date End Date Taking? Authorizing Provider  clindamycin-benzoyl peroxide (BENZACLIN) gel Apply 1 to 2 times daily after you wash your face.  Decrease frequency if your face becomes irritated 07/04/22  Yes Tris Howell, Marzella Schlein, PA-C  benzonatate (TESSALON) 100 MG capsule Take 1 capsule (100 mg total) by mouth every 8 (eight) hours. Patient  not taking: Reported on 06/11/2022 08/17/21   Caccavale, Sophia, PA-C  ketoconazole (NIZORAL) 2 % cream Apply 1 application. topically daily. Patient not taking: Reported on 06/11/2022 05/28/21   Edwin Cap, DPM  lidocaine (LIDODERM) 5 % Place 1 patch onto the skin daily. Remove & Discard patch within 12 hours or as directed by MD Patient not taking: Reported on 06/11/2022 12/27/20   Storm Frisk, MD  meloxicam (MOBIC) 15 MG tablet Take 1 tablet (15 mg total) by mouth daily. Patient not taking: Reported on 06/11/2022 12/27/20   Storm Frisk, MD  promethazine-dextromethorphan (PROMETHAZINE-DM) 6.25-15 MG/5ML syrup Take 5 mLs by mouth 4 (four) times daily as needed for cough. Patient not taking: Reported on 06/11/2022 08/17/21   Caccavale, Sophia, PA-C    ROS: Neg HEENT Neg resp Neg cardiac Neg GI Neg MS Neg psych Neg neuro  Objective:   Vitals:   07/04/22 0844  BP: 104/68  Pulse: 68  SpO2: 95%  Weight: 185 lb 12.8 oz (84.3 kg)   Exam General appearance : Awake, alert, not in any distress. Speech Clear. Not toxic looking HEENT: Atraumatic and Normocephalic Neck: Supple, no JVD. No cervical lymphadenopathy.  Chest: Good air entry bilaterally, CTAB.  No rales/rhonchi/wheezing CVS: S1 S2 regular, no murmurs.  Extremities: B/L Lower Ext shows no edema, both legs are warm to touch Neurology: Awake alert, and oriented X 3, CN II-XII intact, Non  focal Skin: No Rash;  there is Tzone acne present  Data Review No results found for: "HGBA1C"  Assessment & Plan   1. Dysmenorrhea I wrote a note stating she is complaining of heavy periods.  However, I DID NOT WRITE HER OUT OF WORK.  I did not agree to any FMLA paperwork.  I advised she needs further work up of the problem as there may be a solution that helps! - Ambulatory referral to Gynecology  2. Other acne - clindamycin-benzoyl peroxide (BENZACLIN) gel; Apply 1 to 2 times daily after you wash your face.  Decrease frequency  if your face becomes irritated  Dispense: 25 g; Refill: 3  3. Tinea pedis of both feet She is not happy with lamisil or ketaconazole and sweats in her boots at work.  Wants to see a specialist - Ambulatory referral to Podiatry  4. Onychomycosis She is not happy with lamisil or ketaconazole and sweats in her boots at work.  Wants to see a specialist - Ambulatory referral to Podiatry    Return in about 4 months (around 11/04/2022) for PCP for chronic conditions.  The patient was given clear instructions to go to ER or return to medical center if symptoms don't improve, worsen or new problems develop. The patient verbalized understanding. The patient was told to call to get lab results if they haven't heard anything in the next week.      Georgian Co, PA-C [facility] and [facility] [place], [place] [phone]   07/04/2022, 9:00 AM

## 2022-07-04 NOTE — Telephone Encounter (Signed)
Patient has appointment this AM with [facility]

## 2022-07-04 NOTE — Telephone Encounter (Signed)
Requested medication (s) are due for refill today: yes  Requested medication (s) are on the active medication list: yes  Last refill:  07/04/22  Future visit scheduled: no  Notes to clinic:  Pharmacy comment: not covered please change to something else      Requested Prescriptions  Pending Prescriptions Disp Refills   clindamycin-benzoyl peroxide (BENZACLIN) gel 25 g 3    Sig: Apply 1 to 2 times daily after you wash your face.  Decrease frequency if your face becomes irritated     Dermatology:  Acne preparations Passed - 07/04/2022  9:00 AM      Passed - Valid encounter within last 12 months    Recent Outpatient Visits           Today Dysmenorrhea   [facility], [place], [place]   1 year ago Lumbar radiculopathy   [facility] Storm Frisk, MD   2 years ago Onychomycosis   [facility] [place], [place], [place]

## 2022-07-12 ENCOUNTER — Other Ambulatory Visit (HOSPITAL_COMMUNITY): Payer: Self-pay

## 2022-07-17 ENCOUNTER — Other Ambulatory Visit (HOSPITAL_COMMUNITY): Payer: Self-pay

## 2022-07-17 ENCOUNTER — Ambulatory Visit: Payer: No Typology Code available for payment source | Admitting: Physician Assistant

## 2022-07-18 ENCOUNTER — Other Ambulatory Visit (HOSPITAL_COMMUNITY): Payer: Self-pay

## 2022-07-19 ENCOUNTER — Other Ambulatory Visit (HOSPITAL_COMMUNITY): Payer: Self-pay

## 2022-07-24 ENCOUNTER — Other Ambulatory Visit (HOSPITAL_COMMUNITY): Payer: Self-pay

## 2022-07-25 ENCOUNTER — Other Ambulatory Visit: Payer: Self-pay | Admitting: Pharmacist

## 2022-07-25 ENCOUNTER — Other Ambulatory Visit (HOSPITAL_COMMUNITY): Payer: Self-pay

## 2022-07-25 ENCOUNTER — Other Ambulatory Visit: Payer: Self-pay

## 2022-07-26 ENCOUNTER — Other Ambulatory Visit (HOSPITAL_COMMUNITY): Payer: Self-pay

## 2022-07-30 ENCOUNTER — Encounter (HOSPITAL_COMMUNITY): Payer: Self-pay

## 2022-07-30 ENCOUNTER — Emergency Department (HOSPITAL_COMMUNITY)
Admission: EM | Admit: 2022-07-30 | Discharge: 2022-07-30 | Disposition: A | Discharge status: Home / Self Care | Payer: No Typology Code available for payment source | Attending: Student | Admitting: Student

## 2022-07-30 ENCOUNTER — Other Ambulatory Visit: Payer: Self-pay

## 2022-07-30 ENCOUNTER — Emergency Department (HOSPITAL_COMMUNITY): Payer: No Typology Code available for payment source

## 2022-07-30 DIAGNOSIS — Z91040 Latex allergy status: Secondary | ICD-10-CM | POA: Diagnosis not present

## 2022-07-30 DIAGNOSIS — J45909 Unspecified asthma, uncomplicated: Secondary | ICD-10-CM | POA: Diagnosis not present

## 2022-07-30 DIAGNOSIS — R1032 Left lower quadrant pain: Secondary | ICD-10-CM | POA: Diagnosis present

## 2022-07-30 DIAGNOSIS — N3001 Acute cystitis with hematuria: Secondary | ICD-10-CM | POA: Insufficient documentation

## 2022-07-30 DIAGNOSIS — Z87891 Personal history of nicotine dependence: Secondary | ICD-10-CM | POA: Diagnosis not present

## 2022-07-30 LAB — LIPASE, BLOOD: Lipase: 26 U/L (ref 11–51)

## 2022-07-30 LAB — URINALYSIS, ROUTINE W REFLEX MICROSCOPIC
Bilirubin Urine: NEGATIVE
Glucose, UA: NEGATIVE mg/dL
Hgb urine dipstick: NEGATIVE
Ketones, ur: NEGATIVE mg/dL
Leukocytes,Ua: NEGATIVE
Nitrite: NEGATIVE
Protein, ur: NEGATIVE mg/dL
Specific Gravity, Urine: >1.046 — ABNORMAL HIGH (ref 1.005–1.030)
pH: 7 (ref 5.0–8.0)

## 2022-07-30 LAB — COMPREHENSIVE METABOLIC PANEL
ALT: 9 U/L (ref 0–44)
AST: 15 U/L (ref 15–41)
Albumin: 3.5 g/dL (ref 3.5–5.0)
Alkaline Phosphatase: 69 U/L (ref 38–126)
Anion gap: 6 (ref 5–15)
BUN: 11 mg/dL (ref 6–20)
CO2: 22 mmol/L (ref 22–32)
Calcium: 8.9 mg/dL (ref 8.9–10.3)
Chloride: 109 mmol/L (ref 98–111)
Creatinine, Ser: 1.25 mg/dL — ABNORMAL HIGH (ref 0.44–1.00)
GFR, Estimated: >60 mL/min (ref >60)
Glucose, Bld: 98 mg/dL (ref 70–99)
Potassium: 3.7 mmol/L (ref 3.5–5.1)
Sodium: 137 mmol/L (ref 135–145)
Total Bilirubin: 0.6 mg/dL (ref 0.3–1.2)
Total Protein: 7 g/dL (ref 6.5–8.1)

## 2022-07-30 LAB — CBC WITH DIFFERENTIAL/PLATELET
Abs Immature Granulocytes: 0.01 10*3/uL (ref 0.00–0.07)
Basophils Absolute: 0 10*3/uL (ref 0.0–0.1)
Basophils Relative: 0 %
Eosinophils Absolute: 0.3 10*3/uL (ref 0.0–0.5)
Eosinophils Relative: 5 %
HCT: 40 % (ref 36.0–46.0)
Hemoglobin: 13.5 g/dL (ref 12.0–15.0)
Immature Granulocytes: 0 %
Lymphocytes Relative: 45 %
Lymphs Abs: 3.1 10*3/uL (ref 0.7–4.0)
MCH: 31.5 pg (ref 26.0–34.0)
MCHC: 33.8 g/dL (ref 30.0–36.0)
MCV: 93.5 fL (ref 80.0–100.0)
Monocytes Absolute: 0.6 10*3/uL (ref 0.1–1.0)
Monocytes Relative: 9 %
Neutro Abs: 2.8 10*3/uL (ref 1.7–7.7)
Neutrophils Relative %: 41 %
Platelets: 236 10*3/uL (ref 150–400)
RBC: 4.28 MIL/uL (ref 3.87–5.11)
RDW: 13.2 % (ref 11.5–15.5)
WBC: 6.8 10*3/uL (ref 4.0–10.5)
nRBC: 0 % (ref 0.0–0.2)

## 2022-07-30 LAB — I-STAT BETA HCG BLOOD, ED (MC, WL, AP ONLY): I-stat hCG, quantitative: <5 m[IU]/mL (ref <5)

## 2022-07-30 MED ORDER — IOHEXOL 300 MG/ML  SOLN
100.0000 mL | Freq: Once | INTRAMUSCULAR | Status: AC | PRN
  Administered 2022-07-30: 100 mL via INTRAVENOUS

## 2022-07-30 MED ORDER — LACTATED RINGERS IV BOLUS
1000.0000 mL | Freq: Once | INTRAVENOUS | Status: AC
  Administered 2022-07-30: 1000 mL via INTRAVENOUS

## 2022-07-30 MED ORDER — NITROFURANTOIN MONOHYD MACRO 100 MG PO CAPS: 100.0000 mg | ORAL_CAPSULE | Freq: Two times a day (BID) | ORAL | 0 refills | Status: DC

## 2022-07-30 MED ORDER — SODIUM CHLORIDE (PF) 0.9 % IJ SOLN
INTRAMUSCULAR | Status: AC
  Filled 2022-07-30: qty 50

## 2022-07-30 NOTE — ED Provider Notes (Signed)
F/U UA, if positive Tx with abx. Pain free. OK for D/C. Physical Exam  BP 101/71   Pulse 72   Temp 98.4 F (36.9 C) (Oral)   Resp 16   Ht 5\' 7"  (1.702 m)   Wt 84.3 kg   SpO2 100%   BMI 29.11 kg/m   Physical Exam  Procedures  Procedures  ED Course / MDM    Medical Decision Making Amount and/or Complexity of Data Reviewed Labs: ordered. Radiology: ordered.  Risk Prescription drug management.          Arby Barrette, MD 08/03/22 [phone]

## 2022-07-30 NOTE — Discharge Instructions (Addendum)
1.  Take antibiotics as prescribed.  Your urine shows signs of infection. 2.  Follow-up with your gynecologist for recheck to make sure your symptoms have resolved.  Try to get an appointment ideally within the next 1 to 2 weeks. 3.  Stay well-hydrated.  You may take over-the-counter ibuprofen and Tylenol for pain if needed. 4.  Return to the emergency department if you have sudden worsening or changing pain, develop fevers or other new and concerning symptoms.

## 2022-07-30 NOTE — ED Provider Notes (Signed)
[place] EMERGENCY DEPARTMENT AT [facility] Provider Note  CSN: [id number] Arrival date & time: 07/30/22 1256  Chief Complaint(s) Abdominal Pain (LLQ pain x4 days)  HPI Misty Sanders is a 27 y.o. female with PMH arthritis, asthma who presents emergency room for evaluation of abdominal pain.  Patient states that she has had severe colicky left lower quadrant abdominal pain for the last 4 days.  She states that she has had a bowel movement early on in this disease process without improvement of symptoms.  She thinks that her abdominal pain may be linked to the water that she is drinking at her new job.  Denies nausea, vomiting, diarrhea, headache, fever or other systemic symptoms.  No vaginal bleeding or discharge.   Past Medical History Past Medical History:  Diagnosis Date   Arthritis    Asthma    Patient Active Problem List   Diagnosis Date Noted   H/O trichomoniasis 06/11/2022   Lumbar radiculopathy 12/28/2020   Onychomycosis 12/28/2020   Tinea pedis 12/28/2020   Home Medication(s) Prior to Admission medications   Medication Sig Start Date End Date Taking? Authorizing Provider  nitrofurantoin, macrocrystal-monohydrate, (MACROBID) 100 MG capsule Take 1 capsule (100 mg total) by mouth 2 (two) times daily. 07/30/22  Yes Arby Barrette, MD  benzonatate (TESSALON) 100 MG capsule Take 1 capsule (100 mg total) by mouth every 8 (eight) hours. Patient not taking: Reported on 06/11/2022 08/17/21   Caccavale, Sophia, PA-C  Clindamycin-Benzoyl Per, Refr, gel Apply 1 to 2 times daily after you wash your face.  Decrease frequency if your face becomes irritated 07/04/22   Storm Frisk, MD  ketoconazole (NIZORAL) 2 % cream Apply 1 application. topically daily. Patient not taking: Reported on 06/11/2022 05/28/21   Edwin Cap, DPM  lidocaine (LIDODERM) 5 % Place 1 patch onto the skin daily. Remove & Discard patch within 12 hours or as directed by MD Patient not taking: Reported  on 06/11/2022 12/27/20   Storm Frisk, MD  meloxicam (MOBIC) 15 MG tablet Take 1 tablet (15 mg total) by mouth daily. Patient not taking: Reported on 06/11/2022 12/27/20   Storm Frisk, MD  promethazine-dextromethorphan (PROMETHAZINE-DM) 6.25-15 MG/5ML syrup Take 5 mLs by mouth 4 (four) times daily as needed for cough. Patient not taking: Reported on 06/11/2022 08/17/21   Alveria Apley, PA-C                                                                                                                                    Past Surgical History Past Surgical History:  Procedure Laterality Date   ADENOIDECTOMY     TONSILLECTOMY     WISDOM TOOTH EXTRACTION     Family History Family History  Problem Relation Age of Onset   Diabetes Mother    Hypertension Mother    Hyperlipidemia Mother     Social History Social History   Tobacco Use  Smoking status: Former    Packs/day: .25    Types: Cigarettes   Smokeless tobacco: Never  Substance Use Topics   Alcohol use: Yes   Drug use: Yes    Types: Marijuana    Comment: denies any use currently   Allergies Penicillins, Egg-derived products, Peanuts [peanut oil], Sulfa antibiotics, and Latex  Review of Systems Review of Systems  Gastrointestinal:  Positive for abdominal pain.    Physical Exam Vital Signs  I have reviewed the triage vital signs BP (!) 110/59   Pulse 66   Temp 98.4 F (36.9 C) (Oral)   Resp 17   Ht 5\' 7"  (1.702 m)   Wt 84.3 kg   SpO2 100%   BMI 29.11 kg/m   Physical Exam Vitals and nursing note reviewed.  Constitutional:      General: She is not in acute distress.    Appearance: She is well-developed.  HENT:     Head: Normocephalic and atraumatic.  Eyes:     Conjunctiva/sclera: Conjunctivae normal.  Cardiovascular:     Rate and Rhythm: Normal rate and regular rhythm.     Heart sounds: No murmur heard. Pulmonary:     Effort: Pulmonary effort is normal. No respiratory distress.     Breath  sounds: Normal breath sounds.  Abdominal:     Palpations: Abdomen is soft.     Tenderness: There is abdominal tenderness in the left lower quadrant.  Musculoskeletal:        General: No swelling.     Cervical back: Neck supple.  Skin:    General: Skin is warm and dry.     Capillary Refill: Capillary refill takes less than 2 seconds.  Neurological:     Mental Status: She is alert.  Psychiatric:        Mood and Affect: Mood normal.     ED Results and Treatments Labs (all labs ordered are listed, but only abnormal results are displayed) Labs Reviewed  COMPREHENSIVE METABOLIC PANEL - Abnormal; Notable for the following components:      Result Value   Creatinine, Ser 1.25 (*)    All other components within normal limits  URINALYSIS, ROUTINE W REFLEX MICROSCOPIC - Abnormal; Notable for the following components:   Specific Gravity, Urine >1.046 (*)    All other components within normal limits  CBC WITH DIFFERENTIAL/PLATELET  LIPASE, BLOOD  I-STAT BETA HCG BLOOD, ED ([facility], [facility], [facility] ONLY)                                                                                                                          Radiology CT ABDOMEN PELVIS W CONTRAST  Result Date: 07/30/2022 CLINICAL DATA:  Left lower quadrant abdominal pain EXAM: CT ABDOMEN AND PELVIS WITH CONTRAST TECHNIQUE: Multidetector CT imaging of the abdomen and pelvis was performed using the standard protocol following bolus administration of intravenous contrast. RADIATION DOSE REDUCTION: This exam was performed according to the departmental dose-optimization program which includes automated exposure  control, adjustment of the mA and/or kV according to patient size and/or use of iterative reconstruction technique. CONTRAST:  OMNIPAQUE IOHEXOL 300 MG/ML  SOLN COMPARISON:  CT examination dated December 31, 2015 FINDINGS: Lower chest: No acute abnormality. Hepatobiliary: No focal liver abnormality is seen. No gallstones, gallbladder wall  thickening, or biliary dilatation. Pancreas: Unremarkable. No pancreatic ductal dilatation or surrounding inflammatory changes. Spleen: Normal in size without focal abnormality. Adrenals/Urinary Tract: Adrenal glands are unremarkable. Kidneys are normal, without renal calculi, focal lesion, or hydronephrosis. Bladder is unremarkable. Stomach/Bowel: Stomach is within normal limits. Appendix appears normal. No evidence of bowel wall thickening, distention, or inflammatory changes. Vascular/Lymphatic: No significant vascular findings are present. No enlarged abdominal or pelvic lymph nodes. Reproductive: Uterus and bilateral adnexa are unremarkable. Corpus luteal cyst in the left ovary. Other: No abdominal wall hernia or abnormality. No abdominopelvic ascites. Musculoskeletal: No acute or significant osseous findings. IMPRESSION: 1. No CT evidence of acute abdominal/pelvic process. 2. Normal appendix. No evidence of colitis or diverticulitis. 3. Uterus and adnexa are unremarkable. Corpus luteal cyst in the left ovary. Electronically Signed   By: Larose Hires D.O.   On: 07/30/2022 15:10    Pertinent labs & imaging results that were available during my care of the patient were reviewed by me and considered in my medical decision making (see MDM for details).  Medications Ordered in ED Medications  lactated ringers bolus 1,000 mL (0 mLs Intravenous Stopped 07/30/22 1609)  iohexol (OMNIPAQUE) 300 MG/ML solution 100 mL (100 mLs Intravenous Contrast Given 07/30/22 1443)                                                                                                                                     Procedures Procedures  (including critical care time)  Medical Decision Making / ED Course   This patient presents to the ED for concern of abdominal pain, this involves an extensive number of treatment options, and is a complaint that carries with it a high risk of complications and morbidity.  The differential  diagnosis includes diverticulitis, epiploic appendagitis, colitis, gastroenteritis, constipation, nephrolithiasis, inflammatory bowel disease,   MDM: Patient seen emergency room for evaluation of left lower quadrant abdominal pain.  Physical exam with very mild tenderness left lower quadrant but is otherwise unremarkable.  Laboratory evaluation unremarkable outside of a mild creatinine elevation of 1.25.  Pregnancy negative.  CT abdomen pelvis is reassuringly negative with a corpus luteal cyst on the left.  At time of signout, patient pending urinalysis.  Anticipate discharge  Additional history obtained:  -External records from outside source obtained and reviewed including: Chart review including previous notes, labs, imaging, consultation notes   Lab Tests: -I ordered, reviewed, and interpreted labs.   The pertinent results include:   Labs Reviewed  COMPREHENSIVE METABOLIC PANEL - Abnormal; Notable for the following components:      Result Value   Creatinine, Ser 1.25 (*)  All other components within normal limits  URINALYSIS, ROUTINE W REFLEX MICROSCOPIC - Abnormal; Notable for the following components:   Specific Gravity, Urine >1.046 (*)    All other components within normal limits  CBC WITH DIFFERENTIAL/PLATELET  LIPASE, BLOOD  I-STAT BETA HCG BLOOD, ED ([facility], [facility], [facility] ONLY)         Imaging Studies ordered: I ordered imaging studies including CT abdomen pelvis I independently visualized and interpreted imaging. I agree with the radiologist interpretation   Medicines ordered and prescription drug management: Meds ordered this encounter  Medications   lactated ringers bolus 1,000 mL   iohexol (OMNIPAQUE) 300 MG/ML solution 100 mL   nitrofurantoin, macrocrystal-monohydrate, (MACROBID) 100 MG capsule    Sig: Take 1 capsule (100 mg total) by mouth 2 (two) times daily.    Dispense:  10 capsule    Refill:  0    -I have reviewed the patients home medicines and have made  adjustments as needed  Critical interventions none    Cardiac Monitoring: The patient was maintained on a cardiac monitor.  I personally viewed and interpreted the cardiac monitored which showed an underlying rhythm of: NSR  Social Determinants of Health:  Factors impacting patients care include: Concerned about water quality at work   Reevaluation: After the interventions noted above, I reevaluated the patient and found that they have :improved  Co morbidities that complicate the patient evaluation  Past Medical History:  Diagnosis Date   Arthritis    Asthma       Dispostion: I considered admission for this patient, and disposition pending completion of urinalysis.  Please see provider signout note for continuation of workup.  Anticipate discharge     Final Clinical Impression(s) / ED Diagnoses Final diagnoses:  Left lower quadrant abdominal pain  Acute cystitis with hematuria     @PCDICTATION @    Glendora Score, MD 07/31/22 [phone]

## 2022-08-01 ENCOUNTER — Telehealth: Payer: Self-pay

## 2022-08-01 NOTE — Telephone Encounter (Signed)
I attempted to reach the patient to schedule an appointment with Dr Delford Field, message left with call back requested.

## 2022-08-03 ENCOUNTER — Other Ambulatory Visit (HOSPITAL_COMMUNITY): Payer: Self-pay

## 2022-08-12 NOTE — Telephone Encounter (Signed)
  I attempted again to reach the patient to schedule an appointment with Dr Delford Field, message left with call back requested.

## 2022-08-19 ENCOUNTER — Other Ambulatory Visit (HOSPITAL_COMMUNITY)
Admission: RE | Admit: 2022-08-19 | Discharge: 2022-08-19 | Disposition: A | Discharge status: Home / Self Care | Payer: No Typology Code available for payment source | Source: Ambulatory Visit | Attending: Student | Admitting: Student

## 2022-08-19 ENCOUNTER — Ambulatory Visit (INDEPENDENT_AMBULATORY_CARE_PROVIDER_SITE_OTHER): Payer: Medicaid Other

## 2022-08-19 VITALS — BP 115/71 | HR 70 | Ht 66.0 in | Wt 180.4 lb

## 2022-08-19 DIAGNOSIS — Z01419 Encounter for gynecological examination (general) (routine) without abnormal findings: Secondary | ICD-10-CM | POA: Insufficient documentation

## 2022-08-19 NOTE — Progress Notes (Signed)
NGYN presents for dysmenorrhea. Pelvic US done 6/4, pt reports cyst. Pt states severe pain the first day of periods. Pain is alleviated with some 800mg  ibuprofen.   Last PAP 2013, declines STD testing and BC. Pt desires conceptions ASAP

## 2022-08-19 NOTE — Progress Notes (Signed)
   Subjective:     Misty Sanders is a 27 y.o. female here at [facility] for a routine exam. Current complaints: painful periods. Periods have been painful and more heavy since she stopped Depo. She reports she does change her pad every hour, however this is related to feeling unclean/doesn't like the smell and not because the pads are saturated. She has been taking Aleve which does relieve pain somewhat. Patient was evaluated on 6/4 in the ED and was diagnosed with a corpus luteum cyst. Last pap was in 2013 per patient.   Flowsheet Row Office Visit from 07/04/2022 in [facility]  PHQ-2 Total Score 0       Health Maintenance Due  Topic Date Due   Hepatitis C Screening  Never done   PAP-Cervical Cytology Screening  03/08/2015   PAP SMEAR-Modifier  03/08/2015    Risk factors for chronic health problems: Smoking: denies Alchohol/how much: denies Illicit drug use: denies Pt BMI: Body mass index is 29.12 kg/m.   Gynecologic History Patient's last menstrual period was 08/10/2022. Contraception: none Last Pap: 2013 per patient Last mammogram: n/a  Obstetric History OB History  Gravida Para Term Preterm AB Living  0 0 0 0 0 0  SAB IAB Ectopic Multiple Live Births  0 0 0 0 0   The following portions of the patient's history were reviewed and updated as appropriate: allergies, current medications, past family history, past medical history, past social history, past surgical history, and problem list.  Review of Systems Pertinent items are noted in HPI.    Objective:  BP 115/71   Pulse 70   Ht 5\' 6"  (1.676 m)   Wt 180 lb 6.4 oz (81.8 kg)   LMP 08/10/2022 Comment: neg preg test 07/30/2022  BMI 29.12 kg/m   VS reviewed, nursing note reviewed,  Constitutional: well developed, well nourished, no distress HEENT: normocephalic HEART: regular rate RESP: effort normal Breast Exam: Deferred with low risks and shared decision making, discussed  recommendation to start mammogram between 40-50 yo/ exam Abdomen: soft Neuro: alert and oriented x 3 Skin: warm, dry Psych: affect normal Pelvic exam: Cervix pink, visually closed, without lesion, scant white creamy discharge, vaginal walls and external genitalia normal Bimanual exam: Cervix 0/long/high, firm, anterior, neg CMT, uterus nontender, nonenlarged, adnexa without tenderness, enlargement, or mass     Assessment/Plan:   1. Well woman exam with routine gynecological exam - Pap today - Declines STI screening - Discussed normalcy of corpus luteum cyst. Discussed contraception options. Patient declines contraception to relieve painful periods as she desires to conceive. Start taking PNV with folic acid - Recommend scheduled Ibuprofen around start of period and for 2-3 days during period  - Cytology - PAP( [place])    Return in about 3 months (around 11/19/2022).   Brand Males, CNM 2:01 PM

## 2022-08-22 LAB — CYTOLOGY - PAP
Comment: NEGATIVE
Diagnosis: UNDETERMINED — AB
High risk HPV: NEGATIVE

## 2022-08-28 NOTE — Telephone Encounter (Signed)
Patient has scheduled an appt for 09/03/2022

## 2022-09-02 NOTE — Progress Notes (Unsigned)
   Established Patient Office Visit  Subjective   Patient ID: Misty Sanders, female    DOB: 04-28-1995  Age: 27 y.o. MRN: [id number]  No chief complaint on file.   27 y.o.F not seen by Delford Field since 2022 Saw McClung 5 /2024  Dysmenorrhea I wrote a note stating she is complaining of heavy periods.  However, I DID NOT WRITE HER OUT OF WORK.  I did not agree to any FMLA paperwork.  I advised she needs further work up of the problem as there may be a solution that helps! - Ambulatory referral to Gynecology   2. Other acne - clindamycin-benzoyl peroxide (BENZACLIN) gel; Apply 1 to 2 times daily after you wash your face.  Decrease frequency if your face becomes irritated  Dispense: 25 g; Refill: 3   3. Tinea pedis of both feet She is not happy with lamisil or ketaconazole and sweats in her boots at work.  Wants to see a specialist - Ambulatory referral to Podiatry   4. Onychomycosis She is not happy with lamisil or ketaconazole and sweats in her boots at work.  Wants to see a specialist - Ambulatory referral to Podiatry          {History (Optional):23778}  ROS    Objective:     LMP 08/10/2022 Comment: neg preg test 07/30/2022 {Vitals History (Optional):23777}  Physical Exam   No results found for any visits on 09/03/22.  {Labs (Optional):23779}  The ASCVD Risk score (Arnett DK, et al., 2019) failed to calculate for the following reasons:   The 2019 ASCVD risk score is only valid for ages 37 to 50    Assessment & Plan:   Problem List Items Addressed This Visit   None   No follow-ups on file.    Shan Levans, MD

## 2022-09-03 ENCOUNTER — Ambulatory Visit: Payer: No Typology Code available for payment source | Admitting: Critical Care Medicine

## 2022-09-03 ENCOUNTER — Encounter: Payer: Self-pay | Admitting: Critical Care Medicine

## 2022-09-03 ENCOUNTER — Ambulatory Visit: Payer: Medicaid Other | Attending: Critical Care Medicine | Admitting: Critical Care Medicine

## 2022-09-03 VITALS — BP 105/67 | HR 69 | Wt 180.0 lb

## 2022-09-03 DIAGNOSIS — L709 Acne, unspecified: Secondary | ICD-10-CM | POA: Diagnosis not present

## 2022-09-03 DIAGNOSIS — B351 Tinea unguium: Secondary | ICD-10-CM

## 2022-09-03 DIAGNOSIS — B353 Tinea pedis: Secondary | ICD-10-CM

## 2022-09-03 MED ORDER — CLINDAMYCIN PHOS-BENZOYL PEROX 1.2-5 % EX GEL: CUTANEOUS | 0 refills | Status: DC

## 2022-09-03 NOTE — Assessment & Plan Note (Signed)
Resolved

## 2022-09-03 NOTE — Assessment & Plan Note (Signed)
Will order BenzaClin

## 2022-09-03 NOTE — Patient Instructions (Signed)
Skin cream sent to [facility] Moisturize feet daily   REturn Dr Delford Field 6 months

## 2022-09-03 NOTE — Assessment & Plan Note (Signed)
Stable

## 2022-11-06 ENCOUNTER — Ambulatory Visit: Payer: No Typology Code available for payment source | Admitting: Physician Assistant

## 2022-11-09 ENCOUNTER — Encounter (HOSPITAL_COMMUNITY): Payer: Self-pay

## 2022-11-09 ENCOUNTER — Emergency Department (HOSPITAL_COMMUNITY)
Admission: EM | Admit: 2022-11-09 | Discharge: 2022-11-09 | Disposition: A | Discharge status: Home / Self Care | Payer: No Typology Code available for payment source | Attending: Emergency Medicine | Admitting: Emergency Medicine

## 2022-11-09 ENCOUNTER — Other Ambulatory Visit (HOSPITAL_COMMUNITY): Payer: Self-pay

## 2022-11-09 DIAGNOSIS — O26899 Other specified pregnancy related conditions, unspecified trimester: Secondary | ICD-10-CM | POA: Insufficient documentation

## 2022-11-09 DIAGNOSIS — Z3A00 Weeks of gestation of pregnancy not specified: Secondary | ICD-10-CM | POA: Insufficient documentation

## 2022-11-09 DIAGNOSIS — Z3490 Encounter for supervision of normal pregnancy, unspecified, unspecified trimester: Secondary | ICD-10-CM

## 2022-11-09 DIAGNOSIS — Z91040 Latex allergy status: Secondary | ICD-10-CM | POA: Diagnosis not present

## 2022-11-09 DIAGNOSIS — Z91010 Allergy to peanuts: Secondary | ICD-10-CM | POA: Insufficient documentation

## 2022-11-09 LAB — HCG, SERUM, QUALITATIVE: Preg, Serum: POSITIVE — AB

## 2022-11-09 LAB — HCG, QUANTITATIVE, PREGNANCY: hCG, Beta Chain, Quant, S: 7435 m[IU]/mL — ABNORMAL HIGH (ref <5)

## 2022-11-09 MED ORDER — ONDANSETRON HCL 4 MG PO TABS
4.0000 mg | ORAL_TABLET | Freq: Four times a day (QID) | ORAL | 0 refills | Status: DC
  Filled 2022-11-09: qty 12, 3d supply, fill #0

## 2022-11-09 NOTE — ED Provider Notes (Signed)
[place] EMERGENCY DEPARTMENT AT [facility] Provider Note   CSN: [id number] Arrival date & time: 11/09/22  1623     History  Chief Complaint  Patient presents with   Possible Pregnancy    Misty Sanders is a 27 y.o. female presented for pregnancy test.  Patient dates last menstrual period was 10/01/2022 and denies any vaginal bleeding or vaginal discomfort but notes that she has had generalized abdominal discomfort.  Patient wants to have a pregnancy test drawn here as she has had a positive pregnancy test at home.  Patient does see OB was supposed see her on Thursday however appoint was canceled and so she came to the ER.  Patient denies any nausea vomiting, chest pain, shortness of breath, fevers, vaginal discharge, dysuria, hematuria.  Home Medications Prior to Admission medications   Medication Sig Start Date End Date Taking? Authorizing Provider  ondansetron (ZOFRAN) 4 MG tablet Take 1 tablet (4 mg total) by mouth every 6 (six) hours. 11/09/22  Yes Danine Hor, Fayrene Fearing T, PA-C  Clindamycin-Benzoyl Per, Refr, gel Apply 1 to 2 times daily after you wash your face.  Decrease frequency if your face becomes irritated 09/03/22   Storm Frisk, MD      Allergies    Penicillins, Egg-derived products, Peanuts [peanut oil], Sulfa antibiotics, and Latex    Review of Systems   Review of Systems  Physical Exam Updated Vital Signs BP 109/63 (BP Location: Right Arm)   Pulse 75   Temp 98.2 F (36.8 C)   Resp 16   SpO2 98%  Physical Exam Constitutional:      General: She is not in acute distress. Cardiovascular:     Rate and Rhythm: Normal rate.     Pulses: Normal pulses.     Heart sounds: Normal heart sounds.  Pulmonary:     Effort: Pulmonary effort is normal. No respiratory distress.     Breath sounds: Normal breath sounds.  Abdominal:     Palpations: Abdomen is soft.     Tenderness: There is no abdominal tenderness. There is no guarding or rebound.  Skin:     General: Skin is warm and dry.  Neurological:     Mental Status: She is alert.     ED Results / Procedures / Treatments   Labs (all labs ordered are listed, but only abnormal results are displayed) Labs Reviewed  HCG, SERUM, QUALITATIVE - Abnormal; Notable for the following components:      Result Value   Preg, Serum POSITIVE (*)    All other components within normal limits  HCG, QUANTITATIVE, PREGNANCY    EKG None  Radiology No results found.  Procedures Procedures    Medications Ordered in ED Medications - No data to display  ED Course/ Medical Decision Making/ A&P                                 Medical Decision Making Amount and/or Complexity of Data Reviewed Labs: ordered.  Risk Prescription drug management.   Misty Sanders 27 y.o. presented today for pregnancy test. Working DDx that I considered at this time includes, but not limited to, positive pregnancy, ectopic, miscarriage, UTI.  R/o DDx: ectopic, miscarriage, UT: These are considered less likely due to history of present illness, physical exam, labs/imaging findings  Review of prior external notes: 07/30/2022 ED  Unique Tests and My Interpretation:  hCG qualitative: Positive  Discussion with  Independent Historian: None  Discussion of Management of Tests: None  Risk: Medium: prescription drug management  Risk Stratification Score: None  Plan: On exam patient was in no acute distress with stable vitals.  Patient unremarkable physical exam.  Patient only wanted to have a pregnancy test drawn today which did come back positive.  hCG quant was added on for her OB/GYN as she will need follow-up.  Patient was not endorsing any vaginal symptoms or dysuria or hematuria that would necessitate further workup at this time.  Patient also states she only wanted to have the pregnancy test as well and does not want a larger workup and wants to be discharged.  I encouraged patient to follow-up with her  OB/GYN and to go to the MAU if symptoms are to change or worsen.  Patient will be prescribed Zofran to help with any nausea vomit she may experience with this pregnancy.  Patient was given return precautions. Patient stable for discharge at this time.  Patient verbalized understanding of plan.         Final Clinical Impression(s) / ED Diagnoses Final diagnoses:  Pregnancy, unspecified gestational age    Rx / DC Orders ED Discharge Orders          Ordered    ondansetron (ZOFRAN) 4 MG tablet  Every 6 hours        11/09/22 1822              Remi Deter 11/09/22 1827    Gwyneth Sprout, MD 11/09/22 2322

## 2022-11-09 NOTE — ED Triage Notes (Signed)
Pt arrives via POV. States she is here to get a pregnancy test. Reports breast discomfort and states she has missed her period. LMP 10/01/22

## 2022-11-09 NOTE — Discharge Instructions (Signed)
Please follow-up with your OB/GYN in regards to your recent ER visit and symptoms.  Today you had a positive pregnancy test and will need to follow-up with your OB for further management.  I have prescribed you Zofran to help with any nausea vomiting you may experience you may take Tylenol every 6 hours as needed for pain.  If symptoms change or worsen please is to go to the MAU at [facility].

## 2022-11-11 ENCOUNTER — Other Ambulatory Visit (HOSPITAL_COMMUNITY): Payer: Self-pay

## 2022-11-15 ENCOUNTER — Inpatient Hospital Stay (HOSPITAL_COMMUNITY): Payer: No Typology Code available for payment source

## 2022-11-15 ENCOUNTER — Encounter (HOSPITAL_COMMUNITY): Payer: Self-pay | Admitting: *Deleted

## 2022-11-15 ENCOUNTER — Inpatient Hospital Stay (HOSPITAL_COMMUNITY)
Admission: AD | Admit: 2022-11-15 | Discharge: 2022-11-15 | Disposition: A | Discharge status: Home / Self Care | Payer: No Typology Code available for payment source | Attending: Obstetrics and Gynecology | Admitting: Obstetrics and Gynecology

## 2022-11-15 ENCOUNTER — Other Ambulatory Visit (HOSPITAL_COMMUNITY): Payer: Self-pay

## 2022-11-15 DIAGNOSIS — K5909 Other constipation: Secondary | ICD-10-CM | POA: Diagnosis not present

## 2022-11-15 DIAGNOSIS — O99611 Diseases of the digestive system complicating pregnancy, first trimester: Secondary | ICD-10-CM | POA: Diagnosis not present

## 2022-11-15 DIAGNOSIS — Z3A01 Less than 8 weeks gestation of pregnancy: Secondary | ICD-10-CM | POA: Insufficient documentation

## 2022-11-15 DIAGNOSIS — N390 Urinary tract infection, site not specified: Secondary | ICD-10-CM | POA: Diagnosis not present

## 2022-11-15 DIAGNOSIS — O26891 Other specified pregnancy related conditions, first trimester: Secondary | ICD-10-CM | POA: Insufficient documentation

## 2022-11-15 DIAGNOSIS — O2341 Unspecified infection of urinary tract in pregnancy, first trimester: Secondary | ICD-10-CM | POA: Insufficient documentation

## 2022-11-15 DIAGNOSIS — R102 Pelvic and perineal pain: Secondary | ICD-10-CM | POA: Diagnosis not present

## 2022-11-15 DIAGNOSIS — N3000 Acute cystitis without hematuria: Secondary | ICD-10-CM

## 2022-11-15 DIAGNOSIS — Z87891 Personal history of nicotine dependence: Secondary | ICD-10-CM | POA: Insufficient documentation

## 2022-11-15 LAB — CBC
HCT: 39.3 % (ref 36.0–46.0)
Hemoglobin: 13.4 g/dL (ref 12.0–15.0)
MCH: 31 pg (ref 26.0–34.0)
MCHC: 34.1 g/dL (ref 30.0–36.0)
MCV: 91 fL (ref 80.0–100.0)
Platelets: 239 10*3/uL (ref 150–400)
RBC: 4.32 MIL/uL (ref 3.87–5.11)
RDW: 12.9 % (ref 11.5–15.5)
WBC: 8.5 10*3/uL (ref 4.0–10.5)
nRBC: 0 % (ref 0.0–0.2)

## 2022-11-15 LAB — ABO/RH: ABO/RH(D): O POS

## 2022-11-15 LAB — WET PREP, GENITAL
Clue Cells Wet Prep HPF POC: NONE SEEN
Sperm: NONE SEEN
Trich, Wet Prep: NONE SEEN
WBC, Wet Prep HPF POC: <10 (ref <10)
Yeast Wet Prep HPF POC: NONE SEEN

## 2022-11-15 LAB — URINALYSIS, ROUTINE W REFLEX MICROSCOPIC
Bilirubin Urine: NEGATIVE
Glucose, UA: NEGATIVE mg/dL
Hgb urine dipstick: NEGATIVE
Ketones, ur: NEGATIVE mg/dL
Nitrite: NEGATIVE
Protein, ur: NEGATIVE mg/dL
Specific Gravity, Urine: 1.018 (ref 1.005–1.030)
pH: 6 (ref 5.0–8.0)

## 2022-11-15 LAB — COMPREHENSIVE METABOLIC PANEL
ALT: 14 U/L (ref 0–44)
AST: 17 U/L (ref 15–41)
Albumin: 3.4 g/dL — ABNORMAL LOW (ref 3.5–5.0)
Alkaline Phosphatase: 59 U/L (ref 38–126)
Anion gap: 10 (ref 5–15)
BUN: 8 mg/dL (ref 6–20)
CO2: 20 mmol/L — ABNORMAL LOW (ref 22–32)
Calcium: 9.2 mg/dL (ref 8.9–10.3)
Chloride: 105 mmol/L (ref 98–111)
Creatinine, Ser: 0.87 mg/dL (ref 0.44–1.00)
GFR, Estimated: >60 mL/min (ref >60)
Glucose, Bld: 92 mg/dL (ref 70–99)
Potassium: 3.9 mmol/L (ref 3.5–5.1)
Sodium: 135 mmol/L (ref 135–145)
Total Bilirubin: 0.2 mg/dL — ABNORMAL LOW (ref 0.3–1.2)
Total Protein: 6.7 g/dL (ref 6.5–8.1)

## 2022-11-15 LAB — HCG, QUANTITATIVE, PREGNANCY: hCG, Beta Chain, Quant, S: 26820 m[IU]/mL — ABNORMAL HIGH (ref <5)

## 2022-11-15 MED ORDER — NITROFURANTOIN MONOHYD MACRO 100 MG PO CAPS
100.0000 mg | ORAL_CAPSULE | Freq: Two times a day (BID) | ORAL | 0 refills | Status: AC
  Filled 2022-11-15: qty 10, 5d supply, fill #0

## 2022-11-15 NOTE — MAU Provider Note (Signed)
Plan of Care Update:  Assumed care of patient from Dr. Lucianne Muss [phone]. Results returned shortly thereafter. Patient's results confirm an IUP with gestational age ~[redacted]w[redacted]d. Results also consistent with UTI.   Discussed results with patient. She has an allergy to penicillins. Remembers she was hospitalized after taking them as a child. She does not remember taking keflex or any other cephalosporins after. I also cannot find any record of taking these. Given risk of anaphylaxis, will proceed with Macrobid for treatment of UTI despite being first trimester.  Urine culture, GC/C pending. Encouraged to start a PNV and establish pregnancy care in the area.  Joanne Gavel, MD

## 2022-11-15 NOTE — Discharge Instructions (Signed)
Please start taking a prenatal vitamin and establish pregnancy care in the next few weeks.  [facility] [employer] for [employer] at [employer] [facility]             [address] [place], [place] [address] [phone]  Center for [facility] at [facility]                                                             [address], [address], [place], [place], [address] [phone]  Center for [facility] at [facility] [address], [address], [place], [place], [address] [phone]  Center for [facility] at [facility] [address], [address], [place], [place], [address] [phone]  Center for [facility] at [facility]                                 [address] [place], [place], [place], [address] [phone]  Center for [facility] at [facility]                                    [address] [place], [place], [address] [phone]  Center for [facility] at [facility] [address], [address], [place], [place], [address]                              [facility] [address], [place], [place], [address] [phone]  [facility]         Phone: [phone]  [facility]      Phone: [phone]   [facility]      Phone: [phone]  [facility] Department-Family Planning         Phone: [phone]   [facility] Health Department-Maternity    Phone: [phone]  [facility]      Phone: [phone]  [facility]     Phone: [phone]  [facility]        Phone: [phone]  [facility] [place]) [phone]  [facility]      Phone: [phone]

## 2022-11-15 NOTE — MAU Note (Signed)
.  Misty Sanders is a 27 y.o. at Unknown here in MAU reporting pain in lower abdomen when she urinates. Also has random pains in lower abd since Sunday night. Pt describes pain as mild LMP: 10/01/22 Onset of complaint: Sunday Pain score: 5 Vitals:   11/15/22 0632 11/15/22 0635  BP:  103/67  Pulse: 62   Resp: 16   Temp: 98 F (36.7 C)   SpO2: 100%      FHT:n/a Lab orders placed from triage:  u/a

## 2022-11-15 NOTE — MAU Provider Note (Addendum)
History     CSN: [id number]  Arrival date and time: 11/15/22 4098   Event Date/Time   First Provider Initiated Contact with Patient 11/15/22 0701      Chief Complaint  Patient presents with   Abdominal Pain   HPI Misty Sanders is a 27 y.o. G1P0000 at [redacted]w[redacted]d who presents for pelvic pain and urinary discomfort. Reports symptoms ongoing x 5 days. Describes urinary discomfort as pain and pressure. Has had accompanying but also random pelvic pains. Tried to increase hydration status, but had not gotten much relief of symptoms. No fevers, chills, n/v, vaginal bleeding. Reports chronic constipation.  Past Medical History:  Diagnosis Date   Arthritis    Asthma     Past Surgical History:  Procedure Laterality Date   ADENOIDECTOMY     TONSILLECTOMY     WISDOM TOOTH EXTRACTION      Family History  Problem Relation Age of Onset   Diabetes Mother    Hypertension Mother    Hyperlipidemia Mother     Social History   Tobacco Use   Smoking status: Former    Current packs/day: 0.25    Types: Cigarettes   Smokeless tobacco: Never  Vaping Use   Vaping status: Never Used  Substance Use Topics   Alcohol use: Yes   Drug use: Yes    Types: Marijuana    Comment: denies any use currently    Allergies:  Allergies  Allergen Reactions   Penicillins Anaphylaxis and Other (See Comments)    Has patient had a PCN reaction causing immediate rash, facial/tongue/throat swelling, SOB or lightheadedness with hypotension: Yes Has patient had a PCN reaction causing severe rash involving mucus membranes or skin necrosis: No Has patient had a PCN reaction that required hospitalization: No Has patient had a PCN reaction occurring within the last 10 years: No If all of the above answers are "NO", then may proceed with Cephalosporin use.   Egg-Derived Products Itching and Swelling   Peanuts [Peanut Oil] Hives   Sulfa Antibiotics Hives   Latex Rash    Medications Prior to Admission   Medication Sig Dispense Refill Last Dose   Clindamycin-Benzoyl Per, Refr, gel Apply 1 to 2 times daily after you wash your face.  Decrease frequency if your face becomes irritated 45 g 0    ondansetron (ZOFRAN) 4 MG tablet Take 1 tablet (4 mg total) by mouth every 6 (six) hours. 12 tablet 0    ROS reviewed and pertinent positives and negatives as documented in HPI.  Physical Exam   Blood pressure 103/67, pulse 62, temperature 98 F (36.7 C), resp. rate 16, height 5\' 7"  (1.702 m), weight 78.9 kg, last menstrual period 10/01/2022, SpO2 100%.  Physical Exam Constitutional:      General: She is not in acute distress.    Appearance: Normal appearance.  HENT:     Head: Normocephalic and atraumatic.  Cardiovascular:     Rate and Rhythm: Normal rate and regular rhythm.     Heart sounds: Normal heart sounds.  Pulmonary:     Effort: Pulmonary effort is normal. No respiratory distress.     Breath sounds: Normal breath sounds.  Abdominal:     General: Abdomen is flat. There is no distension.     Palpations: Abdomen is soft.     Tenderness: There is abdominal tenderness (mild) in the suprapubic area. There is no guarding or rebound.  Musculoskeletal:        General: Normal range of motion.  Skin:    General: Skin is warm and dry.     Findings: No rash.  Neurological:     General: No focal deficit present.     Mental Status: She is alert and oriented to person, place, and time.     MAU Course  Procedures  MDM 27 y.o. G1P0000 at [redacted]w[redacted]d who presents with suprapubic pain and dysuria. Vital signs are stable, and exam is reassuring. Suspect likely UTI vs vaginitis, but cannot rule out ectopic pregnancy. Labs, swabs, UA, and U/S ordered and are pending.  8:10 AM  Patient signed out to Dr. Wylene Simmer.   Assessment and Plan    Sundra Aland 11/15/2022, 7:02 AM

## 2022-11-16 LAB — CULTURE, OB URINE: Culture: NO GROWTH

## 2022-11-18 LAB — GC/CHLAMYDIA PROBE AMP (~~LOC~~) NOT AT ARMC
Chlamydia: NEGATIVE
Comment: NEGATIVE
Comment: NORMAL
Neisseria Gonorrhea: NEGATIVE

## 2022-11-19 ENCOUNTER — Other Ambulatory Visit (HOSPITAL_COMMUNITY): Payer: Self-pay

## 2022-11-27 ENCOUNTER — Ambulatory Visit: Payer: No Typology Code available for payment source | Admitting: Physician Assistant

## 2023-02-24 ENCOUNTER — Emergency Department (HOSPITAL_BASED_OUTPATIENT_CLINIC_OR_DEPARTMENT_OTHER)
Admission: EM | Admit: 2023-02-24 | Discharge: 2023-02-24 | Discharge status: Against Medical Advice | Payer: No Typology Code available for payment source | Attending: Emergency Medicine | Admitting: Emergency Medicine

## 2023-02-24 ENCOUNTER — Other Ambulatory Visit: Payer: Self-pay

## 2023-02-24 ENCOUNTER — Inpatient Hospital Stay (HOSPITAL_COMMUNITY)
Admission: AD | Admit: 2023-02-24 | Discharge: 2023-02-24 | Discharge status: Against Medical Advice | Payer: No Typology Code available for payment source | Attending: Obstetrics and Gynecology | Admitting: Obstetrics and Gynecology

## 2023-02-24 ENCOUNTER — Encounter (HOSPITAL_BASED_OUTPATIENT_CLINIC_OR_DEPARTMENT_OTHER): Payer: Self-pay

## 2023-02-24 DIAGNOSIS — Z20822 Contact with and (suspected) exposure to covid-19: Secondary | ICD-10-CM | POA: Insufficient documentation

## 2023-02-24 DIAGNOSIS — Z5321 Procedure and treatment not carried out due to patient leaving prior to being seen by health care provider: Secondary | ICD-10-CM | POA: Diagnosis not present

## 2023-02-24 DIAGNOSIS — O219 Vomiting of pregnancy, unspecified: Secondary | ICD-10-CM | POA: Insufficient documentation

## 2023-02-24 LAB — CBC WITH DIFFERENTIAL/PLATELET
Abs Immature Granulocytes: 0.07 10*3/uL (ref 0.00–0.07)
Basophils Absolute: 0 10*3/uL (ref 0.0–0.1)
Basophils Relative: 0 %
Eosinophils Absolute: 0 10*3/uL (ref 0.0–0.5)
Eosinophils Relative: 0 %
HCT: 34.3 % — ABNORMAL LOW (ref 36.0–46.0)
Hemoglobin: 12 g/dL (ref 12.0–15.0)
Immature Granulocytes: 1 %
Lymphocytes Relative: 5 %
Lymphs Abs: 0.6 10*3/uL — ABNORMAL LOW (ref 0.7–4.0)
MCH: 32.1 pg (ref 26.0–34.0)
MCHC: 35 g/dL (ref 30.0–36.0)
MCV: 91.7 fL (ref 80.0–100.0)
Monocytes Absolute: 0.5 10*3/uL (ref 0.1–1.0)
Monocytes Relative: 5 %
Neutro Abs: 10.3 10*3/uL — ABNORMAL HIGH (ref 1.7–7.7)
Neutrophils Relative %: 89 %
Platelets: 214 10*3/uL (ref 150–400)
RBC: 3.74 MIL/uL — ABNORMAL LOW (ref 3.87–5.11)
RDW: 13.6 % (ref 11.5–15.5)
WBC: 11.4 10*3/uL — ABNORMAL HIGH (ref 4.0–10.5)
nRBC: 0 % (ref 0.0–0.2)

## 2023-02-24 LAB — BASIC METABOLIC PANEL
Anion gap: 10 (ref 5–15)
BUN: 7 mg/dL (ref 6–20)
CO2: 18 mmol/L — ABNORMAL LOW (ref 22–32)
Calcium: 8.9 mg/dL (ref 8.9–10.3)
Chloride: 104 mmol/L (ref 98–111)
Creatinine, Ser: 0.63 mg/dL (ref 0.44–1.00)
GFR, Estimated: >60 mL/min (ref >60)
Glucose, Bld: 83 mg/dL (ref 70–99)
Potassium: 3.5 mmol/L (ref 3.5–5.1)
Sodium: 132 mmol/L — ABNORMAL LOW (ref 135–145)

## 2023-02-24 LAB — RESP PANEL BY RT-PCR (RSV, FLU A&B, COVID)  RVPGX2
Influenza A by PCR: NEGATIVE
Influenza B by PCR: NEGATIVE
Resp Syncytial Virus by PCR: NEGATIVE
SARS Coronavirus 2 by RT PCR: NEGATIVE

## 2023-02-24 LAB — URINALYSIS, ROUTINE W REFLEX MICROSCOPIC
Bilirubin Urine: NEGATIVE
Glucose, UA: NEGATIVE mg/dL
Hgb urine dipstick: NEGATIVE
Ketones, ur: 80 mg/dL — AB
Leukocytes,Ua: NEGATIVE
Nitrite: NEGATIVE
Protein, ur: 30 mg/dL — AB
Specific Gravity, Urine: >=1.03 (ref 1.005–1.030)
pH: 6 (ref 5.0–8.0)

## 2023-02-24 LAB — URINALYSIS, MICROSCOPIC (REFLEX)

## 2023-02-24 NOTE — Progress Notes (Signed)
Pt called to be triaged, registration staff stated pt left a while ago.  Pt left prior to being triaged and seen by MAU staff.

## 2023-02-24 NOTE — ED Triage Notes (Addendum)
Pt arrives with c/o n/v/d that started this morning around 5am. Pt is currently [redacted] weeks pregnant. Pt reports generalized weakness and cough. Per pt, she hasn't felt baby move since last night, she feels like baby is in a "ball" in her lower ABD. Pt denies vaginal bleeding. Pt reports bodyaches and pts family member has also been sick.

## 2023-02-24 NOTE — ED Provider Triage Note (Signed)
Emergency Medicine Provider Triage Evaluation Note  Misty Sanders , a 27 y.o. female  was evaluated in triage.  Pt complains of flulike terms and nausea.  Patient states that she is about [redacted] weeks pregnant.  She states that she did not feel her baby moving and she was very concerned.  Review of Systems  Positive: Vomiting, chills  Negative:   Physical Exam  BP (!) 107/58 (BP Location: Right Arm)   Pulse 77   Temp 99.5 F (37.5 C)   Resp 20   Wt 78.9 kg   LMP 10/01/2022 Comment: neg preg test 07/30/2022  SpO2 100%   BMI 27.25 kg/m  Gen:   Awake, no distress, anxious  Resp:  Normal effort  MSK:   Moves extremities without difficulty  Other:  Gravid uterus, nontender  Medical Decision Making  Medically screening exam initiated at 7:59 PM.  Appropriate orders placed.  Misty Sanders was informed that the remainder of the evaluation will be completed by another provider, this initial triage assessment does not replace that evaluation, and the importance of remaining in the ED until their evaluation is complete.  Misty Sanders is a 27 y.o. female here presenting with weakness and cough and vomiting and not feeling her baby moving.  I was able to perform a bedside ultrasound and patient has normal IUP.  Baby is moving and heart rate is around 150.  I told her that there is no room in the main ER right now and we are getting CBC and CMP and COVID and flu test and she will be brought back whenever there is a room.  EMERGENCY DEPARTMENT [place] PREGNANCY "Study: Limited Ultrasound of the Pelvis for Pregnancy"  INDICATIONS:Pregnancy(required) Multiple views of the uterus and pelvic cavity were obtained in real-time with a multi-frequency probe.  APPROACH:Transabdominal  PERFORMED BY: Myself IMAGES ARCHIVED?: No LIMITATIONS: Body habitus PREGNANCY FREE FLUID: None ADNEXAL FINDINGS:Left ovary not seen and Right ovary not seen GESTATIONAL AGE, ESTIMATE:  FETAL HEART RATE:  150 INTERPRETATION: Intrauterine gestational sac noted, Yolk sac noted, Fetal pole present, and Fetal heart activity seen       Charlynne Pander, MD 02/24/23 2000

## 2023-02-26 LAB — URINE CULTURE: Culture: <10000 — AB

## 2023-04-15 ENCOUNTER — Inpatient Hospital Stay (HOSPITAL_COMMUNITY)
Admission: AD | Admit: 2023-04-15 | Discharge: 2023-04-15 | Disposition: A | Discharge status: Home / Self Care | Payer: No Typology Code available for payment source | Source: Home / Self Care | Attending: Obstetrics and Gynecology | Admitting: Obstetrics and Gynecology

## 2023-04-15 ENCOUNTER — Encounter (HOSPITAL_COMMUNITY): Payer: Self-pay | Admitting: Obstetrics and Gynecology

## 2023-04-15 DIAGNOSIS — O26893 Other specified pregnancy related conditions, third trimester: Secondary | ICD-10-CM | POA: Diagnosis not present

## 2023-04-15 DIAGNOSIS — J45901 Unspecified asthma with (acute) exacerbation: Secondary | ICD-10-CM | POA: Insufficient documentation

## 2023-04-15 DIAGNOSIS — J069 Acute upper respiratory infection, unspecified: Secondary | ICD-10-CM

## 2023-04-15 DIAGNOSIS — O99513 Diseases of the respiratory system complicating pregnancy, third trimester: Secondary | ICD-10-CM | POA: Insufficient documentation

## 2023-04-15 DIAGNOSIS — Z3A28 28 weeks gestation of pregnancy: Secondary | ICD-10-CM | POA: Insufficient documentation

## 2023-04-15 DIAGNOSIS — Z1152 Encounter for screening for COVID-19: Secondary | ICD-10-CM | POA: Insufficient documentation

## 2023-04-15 LAB — URINALYSIS, ROUTINE W REFLEX MICROSCOPIC
Bilirubin Urine: NEGATIVE
Glucose, UA: NEGATIVE mg/dL
Hgb urine dipstick: NEGATIVE
Ketones, ur: NEGATIVE mg/dL
Nitrite: NEGATIVE
Protein, ur: NEGATIVE mg/dL
Specific Gravity, Urine: 1.009 (ref 1.005–1.030)
pH: 6 (ref 5.0–8.0)

## 2023-04-15 LAB — RESP PANEL BY RT-PCR (RSV, FLU A&B, COVID)  RVPGX2
Influenza A by PCR: NEGATIVE
Influenza B by PCR: NEGATIVE
Resp Syncytial Virus by PCR: NEGATIVE
SARS Coronavirus 2 by RT PCR: NEGATIVE

## 2023-04-15 MED ORDER — METHYLPREDNISOLONE SODIUM SUCC 125 MG IJ SOLR
40.0000 mg | INTRAMUSCULAR | Status: DC
Start: 2023-04-15 — End: 2023-04-15
  Filled 2023-04-15: qty 2

## 2023-04-15 MED ORDER — PREDNISONE 20 MG PO TABS: 20.0000 mg | ORAL_TABLET | Freq: Every day | ORAL | 0 refills | Status: DC

## 2023-04-15 MED ORDER — IPRATROPIUM-ALBUTEROL 0.5-2.5 (3) MG/3ML IN SOLN
3.0000 mL | RESPIRATORY_TRACT | Status: AC
  Administered 2023-04-15: 3 mL via RESPIRATORY_TRACT
  Filled 2023-04-15: qty 3

## 2023-04-15 MED ORDER — AEROCHAMBER PLUS FLO-VU MEDIUM MISC: 1.0000 | Freq: Once | 0 refills | Status: AC

## 2023-04-15 MED ORDER — METHYLPREDNISOLONE SODIUM SUCC 125 MG IJ SOLR
40.0000 mg | INTRAMUSCULAR | Status: AC
  Administered 2023-04-15: 40 mg via INTRAMUSCULAR

## 2023-04-15 MED ORDER — ALBUTEROL SULFATE HFA 108 (90 BASE) MCG/ACT IN AERS: 2.0000 | INHALATION_SPRAY | Freq: Four times a day (QID) | RESPIRATORY_TRACT | 2 refills | Status: DC | PRN

## 2023-04-15 NOTE — MAU Note (Signed)
.  Misty Sanders is a 28 y.o. at [redacted]w[redacted]d here in MAU reporting scratchy throat over wkend. Yesterday chest was tight and hard to breathe. Awoke this am and felt the same. Drank lemon tea and helped alittle. Has a headache and stuff nose. Has hx asthma but does not have inhaler. Has not taken any meds due to "I'm scared to ". Reports good FM and denies LOF or VB   Onset of complaint: 2days Pain score: 8 generalized feeling bad Vitals:   04/15/23 1926 04/15/23 1929  BP:  108/61  Pulse: 80   Resp: 17   Temp: 98.1 F (36.7 C)   SpO2: 100%      FHT: 138  Lab orders placed from triage: u/a

## 2023-04-15 NOTE — MAU Provider Note (Signed)
History     CSN: [id number]  Arrival date and time: 04/15/23 1851   Event Date/Time   First Provider Initiated Contact with Patient 04/15/2023  7:29 PM   Chief Complaint  Patient presents with   Asthma   Cough    HPI  LANIYAH ROSENWALD is a 28 y.o. G1P0000 at [redacted]w[redacted]d who presents to the MAU for complaint of cough with chest tightness. Cough started two to three days ago, but started feeling chest tightness and wheezing yesterday. She otherwise has been in her usual state of health. No one has been sick around her. She denies fevers, chills. No vomiting or diarrhea. Has known hx asthma, no recent flares.  Past Medical History:  Diagnosis Date   Arthritis    Asthma     Past Surgical History:  Procedure Laterality Date   ADENOIDECTOMY     TONSILLECTOMY     WISDOM TOOTH EXTRACTION      Family History  Problem Relation Age of Onset   Diabetes Mother    Hypertension Mother    Hyperlipidemia Mother     Social History   Tobacco Use   Smoking status: Former    Current packs/day: 0.25    Types: Cigarettes   Smokeless tobacco: Never  Vaping Use   Vaping status: Never Used  Substance Use Topics   Alcohol use: Not Currently   Drug use: Not Currently    Types: Marijuana    Comment: denies any use currently    Allergies:  Allergies  Allergen Reactions   Penicillins Anaphylaxis and Other (See Comments)    Has patient had a PCN reaction causing immediate rash, facial/tongue/throat swelling, SOB or lightheadedness with hypotension: Yes Has patient had a PCN reaction causing severe rash involving mucus membranes or skin necrosis: No Has patient had a PCN reaction that required hospitalization: No Has patient had a PCN reaction occurring within the last 10 years: No If all of the above answers are "NO", then may proceed with Cephalosporin use.   Egg-Derived Products Itching and Swelling   Peanuts [Peanut Oil] Hives   Sulfa Antibiotics Hives   Latex Rash    Medications  Prior to Admission  Medication Sig Dispense Refill Last Dose/Taking   prenatal vitamin w/FE, FA (NATACHEW) 29-1 MG CHEW chewable tablet Chew 1 tablet by mouth daily at 12 noon.   04/15/2023   ondansetron (ZOFRAN) 4 MG tablet Take 1 tablet (4 mg total) by mouth every 6 (six) hours. 12 tablet 0     ROS reviewed and pertinent positives and negatives as documented in HPI.  Physical Exam   Blood pressure (!) 107/56, pulse 71, temperature 98.1 F (36.7 C), resp. rate 17, height 5\' 7"  (1.702 m), weight 92.5 kg, last menstrual period 10/01/2022, SpO2 100%.  Physical Exam Constitutional:      General: She is not in acute distress.    Appearance: Normal appearance.  HENT:     Head: Normocephalic and atraumatic.  Cardiovascular:     Rate and Rhythm: Normal rate and regular rhythm.     Heart sounds: Normal heart sounds.  Pulmonary:     Effort: Pulmonary effort is normal. No respiratory distress.     Breath sounds: Wheezing (throughout all lung fields) present.  Abdominal:     General: There is no distension.     Palpations: Abdomen is soft.     Tenderness: There is no abdominal tenderness. There is no right CVA tenderness or left CVA tenderness.  Musculoskeletal:  General: Normal range of motion.  Skin:    General: Skin is warm and dry.     Findings: No rash.  Neurological:     General: No focal deficit present.     Mental Status: She is alert and oriented to person, place, and time.   EFM: 145/mod/-a/-d  MAU Course  Procedures  MDM 28 y.o. G1P0000 at [redacted]w[redacted]d presenting for asthma exacerbation. Mild wheezing throughout all lung fields. RSV/COVID/flu negative. She was given a Duoneb treatment and Solumedrol and reports improvement in breathing following these measures. She was discharged with a four day course of Prednisone to complete a five day total burst. Rx for Albuterol also sent.   Assessment and Plan  Exacerbation of asthma, unspecified asthma severity, unspecified whether  persistent  Viral URI - suspect 2/2 viral URI - given neb treatment + IM solumedrol w relief of sxs - d/c w prednisone burst and albuterol   Sundra Aland, MD OB Fellow, Faculty Practice [facility], [facility] for [facility]  04/15/2023, 9:00 PM

## 2023-04-15 NOTE — MAU Note (Signed)
 RT at bedside.

## 2023-04-15 NOTE — Progress Notes (Signed)
Pt states she is feeling better after breathing treatment

## 2023-04-16 ENCOUNTER — Inpatient Hospital Stay (HOSPITAL_COMMUNITY)
Admission: EM | Admit: 2023-04-16 | Discharge: 2023-04-20 | DRG: 832 | Disposition: A | Discharge status: Home / Self Care | Payer: No Typology Code available for payment source | Attending: Internal Medicine | Admitting: Internal Medicine

## 2023-04-16 ENCOUNTER — Encounter (HOSPITAL_COMMUNITY): Payer: Self-pay | Admitting: *Deleted

## 2023-04-16 ENCOUNTER — Emergency Department (HOSPITAL_COMMUNITY): Payer: No Typology Code available for payment source

## 2023-04-16 ENCOUNTER — Other Ambulatory Visit: Payer: Self-pay

## 2023-04-16 DIAGNOSIS — E876 Hypokalemia: Secondary | ICD-10-CM | POA: Diagnosis present

## 2023-04-16 DIAGNOSIS — Z3A28 28 weeks gestation of pregnancy: Secondary | ICD-10-CM | POA: Diagnosis not present

## 2023-04-16 DIAGNOSIS — O99513 Diseases of the respiratory system complicating pregnancy, third trimester: Principal | ICD-10-CM | POA: Diagnosis present

## 2023-04-16 DIAGNOSIS — Z880 Allergy status to penicillin: Secondary | ICD-10-CM

## 2023-04-16 DIAGNOSIS — J45901 Unspecified asthma with (acute) exacerbation: Principal | ICD-10-CM | POA: Diagnosis present

## 2023-04-16 DIAGNOSIS — K219 Gastro-esophageal reflux disease without esophagitis: Secondary | ICD-10-CM | POA: Diagnosis present

## 2023-04-16 DIAGNOSIS — Z91040 Latex allergy status: Secondary | ICD-10-CM

## 2023-04-16 DIAGNOSIS — Z833 Family history of diabetes mellitus: Secondary | ICD-10-CM

## 2023-04-16 DIAGNOSIS — Z1152 Encounter for screening for COVID-19: Secondary | ICD-10-CM

## 2023-04-16 DIAGNOSIS — O99113 Other diseases of the blood and blood-forming organs and certain disorders involving the immune mechanism complicating pregnancy, third trimester: Secondary | ICD-10-CM | POA: Diagnosis present

## 2023-04-16 DIAGNOSIS — D72829 Elevated white blood cell count, unspecified: Secondary | ICD-10-CM | POA: Diagnosis present

## 2023-04-16 DIAGNOSIS — O99283 Endocrine, nutritional and metabolic diseases complicating pregnancy, third trimester: Secondary | ICD-10-CM | POA: Diagnosis present

## 2023-04-16 DIAGNOSIS — Z87891 Personal history of nicotine dependence: Secondary | ICD-10-CM

## 2023-04-16 DIAGNOSIS — Z8249 Family history of ischemic heart disease and other diseases of the circulatory system: Secondary | ICD-10-CM

## 2023-04-16 LAB — CBC WITH DIFFERENTIAL/PLATELET
Abs Immature Granulocytes: 0.13 10*3/uL — ABNORMAL HIGH (ref 0.00–0.07)
Basophils Absolute: 0 10*3/uL (ref 0.0–0.1)
Basophils Relative: 0 %
Eosinophils Absolute: 0 10*3/uL (ref 0.0–0.5)
Eosinophils Relative: 0 %
HCT: 34 % — ABNORMAL LOW (ref 36.0–46.0)
Hemoglobin: 11.6 g/dL — ABNORMAL LOW (ref 12.0–15.0)
Immature Granulocytes: 1 %
Lymphocytes Relative: 13 %
Lymphs Abs: 2.1 10*3/uL (ref 0.7–4.0)
MCH: 32.1 pg (ref 26.0–34.0)
MCHC: 34.1 g/dL (ref 30.0–36.0)
MCV: 94.2 fL (ref 80.0–100.0)
Monocytes Absolute: 0.8 10*3/uL (ref 0.1–1.0)
Monocytes Relative: 5 %
Neutro Abs: 13.1 10*3/uL — ABNORMAL HIGH (ref 1.7–7.7)
Neutrophils Relative %: 81 %
Platelets: 203 10*3/uL (ref 150–400)
RBC: 3.61 MIL/uL — ABNORMAL LOW (ref 3.87–5.11)
RDW: 13.2 % (ref 11.5–15.5)
WBC: 16.2 10*3/uL — ABNORMAL HIGH (ref 4.0–10.5)
nRBC: 0 % (ref 0.0–0.2)

## 2023-04-16 LAB — BASIC METABOLIC PANEL
Anion gap: 16 — ABNORMAL HIGH (ref 5–15)
BUN: 6 mg/dL (ref 6–20)
CO2: 18 mmol/L — ABNORMAL LOW (ref 22–32)
Calcium: 9.2 mg/dL (ref 8.9–10.3)
Chloride: 103 mmol/L (ref 98–111)
Creatinine, Ser: 0.77 mg/dL (ref 0.44–1.00)
GFR, Estimated: >60 mL/min (ref >60)
Glucose, Bld: 122 mg/dL — ABNORMAL HIGH (ref 70–99)
Potassium: 2.8 mmol/L — ABNORMAL LOW (ref 3.5–5.1)
Sodium: 137 mmol/L (ref 135–145)

## 2023-04-16 LAB — RESP PANEL BY RT-PCR (RSV, FLU A&B, COVID)  RVPGX2
Influenza A by PCR: NEGATIVE
Influenza B by PCR: NEGATIVE
Resp Syncytial Virus by PCR: NEGATIVE
SARS Coronavirus 2 by RT PCR: NEGATIVE

## 2023-04-16 LAB — TROPONIN I (HIGH SENSITIVITY): Troponin I (High Sensitivity): 9 ng/L (ref <18)

## 2023-04-16 MED ORDER — ACETAMINOPHEN 10 MG/ML IV SOLN
1000.0000 mg | Freq: Four times a day (QID) | INTRAVENOUS | Status: DC
  Filled 2023-04-16 (×2): qty 100

## 2023-04-16 MED ORDER — ACETAMINOPHEN 325 MG PO TABS
650.0000 mg | ORAL_TABLET | Freq: Four times a day (QID) | ORAL | Status: DC | PRN
  Administered 2023-04-17 – 2023-04-20 (×2): 650 mg via ORAL
  Filled 2023-04-16 (×2): qty 2

## 2023-04-16 MED ORDER — ACETAMINOPHEN 325 MG PO TABS
650.0000 mg | ORAL_TABLET | Freq: Once | ORAL | Status: AC
  Administered 2023-04-16: 650 mg via ORAL
  Filled 2023-04-16: qty 2

## 2023-04-16 MED ORDER — ACETAMINOPHEN 650 MG RE SUPP: 650.0000 mg | Freq: Four times a day (QID) | RECTAL | Status: DC | PRN

## 2023-04-16 MED ORDER — MAGNESIUM SULFATE 2 GM/50ML IV SOLN
2.0000 g | Freq: Once | INTRAVENOUS | Status: AC
  Administered 2023-04-17: 2 g via INTRAVENOUS
  Filled 2023-04-16 (×2): qty 50

## 2023-04-16 MED ORDER — MAGNESIUM SULFATE 2 GM/50ML IV SOLN
2.0000 g | Freq: Once | INTRAVENOUS | Status: DC
Start: 2023-04-16 — End: 2023-04-16
  Administered 2023-04-16: 2 g via INTRAVENOUS
  Filled 2023-04-16: qty 50

## 2023-04-16 MED ORDER — POTASSIUM CHLORIDE 10 MEQ/100ML IV SOLN
10.0000 meq | INTRAVENOUS | Status: AC
  Administered 2023-04-17 (×2): 10 meq via INTRAVENOUS
  Filled 2023-04-16 (×2): qty 100

## 2023-04-16 MED ORDER — IPRATROPIUM-ALBUTEROL 0.5-2.5 (3) MG/3ML IN SOLN
3.0000 mL | Freq: Once | RESPIRATORY_TRACT | Status: AC
  Administered 2023-04-16: 3 mL via RESPIRATORY_TRACT
  Filled 2023-04-16: qty 3

## 2023-04-16 MED ORDER — ONDANSETRON HCL 4 MG PO TABS: 4.0000 mg | ORAL_TABLET | Freq: Four times a day (QID) | ORAL | Status: DC | PRN

## 2023-04-16 MED ORDER — METHYLPREDNISOLONE SODIUM SUCC 125 MG IJ SOLR
80.0000 mg | Freq: Every day | INTRAMUSCULAR | Status: DC
  Administered 2023-04-17 – 2023-04-19 (×3): 80 mg via INTRAVENOUS
  Filled 2023-04-16 (×3): qty 2

## 2023-04-16 MED ORDER — ONDANSETRON HCL 4 MG/2ML IJ SOLN
INTRAMUSCULAR | Status: AC
  Filled 2023-04-16: qty 2

## 2023-04-16 MED ORDER — POTASSIUM CHLORIDE CRYS ER 20 MEQ PO TBCR
20.0000 meq | EXTENDED_RELEASE_TABLET | Freq: Once | ORAL | Status: AC
  Administered 2023-04-16: 20 meq via ORAL
  Filled 2023-04-16: qty 1

## 2023-04-16 MED ORDER — ONDANSETRON HCL 4 MG/2ML IJ SOLN: 4.0000 mg | Freq: Four times a day (QID) | INTRAMUSCULAR | Status: DC | PRN

## 2023-04-16 MED ORDER — COMPLETENATE 29-1 MG PO CHEW
1.0000 | CHEWABLE_TABLET | Freq: Every day | ORAL | Status: DC
  Administered 2023-04-17 – 2023-04-18 (×2): 1 via ORAL
  Filled 2023-04-16 (×3): qty 1

## 2023-04-16 MED ORDER — ALBUTEROL SULFATE (2.5 MG/3ML) 0.083% IN NEBU
2.5000 mg | INHALATION_SOLUTION | RESPIRATORY_TRACT | Status: DC | PRN
  Administered 2023-04-17 (×3): 2.5 mg via RESPIRATORY_TRACT
  Filled 2023-04-16 (×3): qty 3

## 2023-04-16 MED ORDER — ONDANSETRON HCL 4 MG/2ML IJ SOLN
4.0000 mg | Freq: Once | INTRAMUSCULAR | Status: AC
  Administered 2023-04-16: 4 mg via INTRAVENOUS

## 2023-04-16 MED ORDER — ENOXAPARIN SODIUM 40 MG/0.4ML IJ SOSY
40.0000 mg | PREFILLED_SYRINGE | Freq: Every day | INTRAMUSCULAR | Status: DC
  Filled 2023-04-16 (×2): qty 0.4

## 2023-04-16 NOTE — Progress Notes (Signed)
2100- arrived to ED, patient still en route  2105- patient placed on monitors  2121- Dr. Donavan Foil notified of patient and report given. Dr. Donavan Foil request ED Attending request OB consult.  2122- Radiology in patient room for chest x-ray  2142- patient complaining of chest pain, patient stated "I feel like I am going to die". ED RN notified of patient status. Medication provided, EKG completed by ED staff, ED attending in to bedside.  Difficult to keep baby on monitor as patient is coughing, vomiting, and moving around in bed. This RN at bedside and adjusting U/S during this time.  2221- ED attending at bedside to assess breath sounds  0133- monitors removed for patient transport to [facility].  Lovenia Shuck, RN RROB

## 2023-04-16 NOTE — ED Provider Notes (Signed)
[facility] EMERGENCY DEPARTMENT AT [facility] Provider Note   CSN: [id number] Arrival date & time: 04/16/23  2103     History  Chief Complaint  Patient presents with   Shortness of Breath    REE ALCALDE is a 28 y.o. female.  29 year old female who is 28 weeks and 1 day by first trimester ultrasound with history of asthma presenting to the emergency department today with shortness of breath.  The patient states she has been having cough and shortness of breath now over the past few days.  She went to urgent care yesterday and had negative COVID and flu testing.  She states that she is given steroids and albuterol.  She states that her symptoms have continued to get worse despite this.  She actually had medics come out and give her nebulizer treatments this morning.  States that his day went on she has been having worsening shortness of breath.  She came to the ER today for further evaluation regarding this.  She is reporting normal fetal movements with this.  She received Solu-Medrol 125 mg as well as 1 Atrovent nebulizer as well as 10 mg of albuterol on the way to the emergency department today.        Home Medications Prior to Admission medications   Medication Sig Start Date End Date Taking? Authorizing Provider  albuterol (VENTOLIN HFA) 108 (90 Base) MCG/ACT inhaler Inhale 2 puffs into the lungs every 6 (six) hours as needed for wheezing or shortness of breath. 04/15/23   Sundra Aland, MD  ondansetron (ZOFRAN) 4 MG tablet Take 1 tablet (4 mg total) by mouth every 6 (six) hours. 11/09/22   Netta Corrigan, PA-C  predniSONE (DELTASONE) 20 MG tablet Take 1 tablet (20 mg total) by mouth daily. 04/15/23   Sundra Aland, MD  prenatal vitamin w/FE, FA (NATACHEW) 29-1 MG CHEW chewable tablet Chew 1 tablet by mouth daily at 12 noon.    [provider]      Allergies    Penicillins, Egg-derived products, Peanuts [peanut oil], Sulfa antibiotics, and Latex     Review of Systems   Review of Systems  Respiratory:  Positive for cough and wheezing.   All other systems reviewed and are negative.   Physical Exam Updated Vital Signs BP (!) 146/66   Pulse (!) 107   Temp 98.8 F (37.1 C) (Oral)   Resp (!) 30   LMP 10/01/2022 Comment: neg preg test 07/30/2022  SpO2 100%  Physical Exam Vitals and nursing note reviewed.   Gen: Mild conversational dyspnea noted Eyes: PERRL, EOMI HEENT: no oropharyngeal swelling Neck: trachea midline Resp: Diminished with diffuse wheezes throughout all lung fields Card: RRR, no murmurs, rubs, or gallops Abd: nontender, nondistended, gravid uterus noted on palpation Extremities: no calf tenderness, no edema Vascular: 2+ radial pulses bilaterally, 2+ DP pulses bilaterally Skin: no rashes Psyc: acting appropriately   ED Results / Procedures / Treatments   Labs (all labs ordered are listed, but only abnormal results are displayed) Labs Reviewed  BASIC METABOLIC PANEL - Abnormal; Notable for the following components:      Result Value   Potassium 2.8 (*)    CO2 18 (*)    Glucose, Bld 122 (*)    Anion gap 16 (*)    All other components within normal limits  CBC WITH DIFFERENTIAL/PLATELET - Abnormal; Notable for the following components:   WBC 16.2 (*)    RBC 3.61 (*)    Hemoglobin 11.6 (*)  HCT 34.0 (*)    Neutro Abs 13.1 (*)    Abs Immature Granulocytes 0.13 (*)    All other components within normal limits  RESP PANEL BY RT-PCR (RSV, FLU A&B, COVID)  RVPGX2  TROPONIN I (HIGH SENSITIVITY)  TROPONIN I (HIGH SENSITIVITY)    EKG EKG Interpretation Date/Time:  Wednesday April 16 2023 21:50:48 EST Ventricular Rate:  102 PR Interval:  128 QRS Duration:  79 QT Interval:  363 QTC Calculation: 473 R Axis:   80  Text Interpretation: Sinus tachycardia Confirmed by Beckey Downing [phone]) on 04/16/2023 10:07:50 PM  Radiology DG Chest Portable 1 View Result Date: 04/16/2023 CLINICAL DATA:  SOB EXAM:  PORTABLE CHEST 1 VIEW COMPARISON:  Chest x-ray 08/17/2021 FINDINGS: Prominent cardiac silhouette likely due to AP portable technique. The heart and mediastinal contours are unchanged. No focal consolidation. No pulmonary edema. No pleural effusion. No pneumothorax. No acute osseous abnormality. IMPRESSION: No active disease. Electronically Signed   By: Tish Frederickson M.D.   On: 04/16/2023 21:37    Procedures Procedures    Medications Ordered in ED Medications  ipratropium-albuterol (DUONEB) 0.5-2.5 (3) MG/3ML nebulizer solution 3 mL (3 mLs Nebulization Given 04/16/23 2157)  ipratropium-albuterol (DUONEB) 0.5-2.5 (3) MG/3ML nebulizer solution 3 mL (3 mLs Nebulization Given 04/16/23 2321)  ondansetron (ZOFRAN) injection 4 mg (4 mg Intravenous Given 04/16/23 2156)  potassium chloride SA (KLOR-CON M) CR tablet 20 mEq (20 mEq Oral Given 04/16/23 2321)  acetaminophen (TYLENOL) tablet 650 mg (650 mg Oral Given 04/16/23 2321)    ED Course/ Medical Decision Making/ A&P                                 Medical Decision Making 28 year old female with past medical history of asthma who is 28 weeks by first trimester ultrasound presenting to the emergency department today with symptoms consistent with likely asthma exacerbation.  The patient is apparently failing at home.  She has already received Solu-Medrol with medics.  I give the patient magnesium here in addition to an additional DuoNeb treatment.  I think with her symptoms not improving on her home regimen I think that she may require admission for further evaluation and treatment.  She is on the fetal heart monitor now and has reassuring fetal heart tones.  I did discuss the patient's case with Dr. Donavan Foil from [facility].  He recommends medical admission and he will continue fetal monitoring here.  The patient chest x-ray is unremarkable.  She is feeling much better after additional nebulizer treatments.  She is not requiring any oxygen but is still having some  dyspnea with minimal exertion.  Since she is already been treated as an outpatient I do think that she would benefit from inpatient observation admission.  Her white blood cell count is elevated which I suspect is due to her receiving steroids.  Her potassium is low at 2.8.  She is given 20 mEq.  This was also drawn while she was receiving multiple nebulizer treatments as suspect this is likely secondary to the albuterol as well.  CRITICAL CARE Performed by: Durwin Glaze   Total critical care time: 35 minutes  Critical care time was exclusive of separately billable procedures and treating other patients.  Critical care was necessary to treat or prevent imminent or life-threatening deterioration.  Critical care was time spent personally by me on the following activities: development of treatment plan with patient and/or surrogate as well  as nursing, discussions with consultants, evaluation of patient's response to treatment, examination of patient, obtaining history from patient or surrogate, ordering and performing treatments and interventions, ordering and review of laboratory studies, ordering and review of radiographic studies, pulse oximetry and re-evaluation of patient's condition.   Amount and/or Complexity of Data Reviewed Labs: ordered. Radiology: ordered.  Risk OTC drugs. Prescription drug management. Decision regarding hospitalization.           Final Clinical Impression(s) / ED Diagnoses Final diagnoses:  Asthma with acute exacerbation, unspecified asthma severity, unspecified whether persistent    Rx / DC Orders ED Discharge Orders     None         Durwin Glaze, MD 04/16/23 2332

## 2023-04-16 NOTE — H&P (Signed)
History and Physical    Misty Sanders QMV:[id number] DOB: 22-Jul-1995 DOA: 04/16/2023  PCP: Storm Frisk, MD   Patient coming from: Home   Chief Complaint: SOB   HPI: Misty Sanders is a G69P0000 28 y.o. female at [redacted]w[redacted]d gestation with medical history significant for asthma, now presenting with worsening SOB despite treatment with bronchodilators and systemic steroids.   Symptoms began on 04/13/2023.  She complains of cough, wheeze, and shortness of breath.  She was evaluated at MAU yesterday, treated with systemic steroids and bronchodilators, improved, and was discharged with steroid burst and new albuterol prescription.  Unfortunately, her dyspnea was worse this morning.  She called EMS this morning and was treated with nebulized breathing treatments but she did not want transport to the hospital at that time.  She began to worsen again this evening and called EMS again.   She was treated with 125 mg IV Solu-Medrol, 10 mg albuterol, and 1 mg Atrovent by EMS prior to arrival in the ED.   She denies leg swelling or abdominal pain.   ED Course: Upon arrival to the ED, patient is found to be afebrile, tachypneic, and tachycardic with stable blood pressure.  Labs are most notable for potassium 2.8 and WBC 16,200.  Chest x-ray is negative for acute findings.  COVID, RSV, and influenza PCR are negative.  OB/GYN was consulted by the ED physician and the patient was treated with 20 mEq of oral potassium, DuoNeb x 2, Zofran, and acetaminophen.  Review of Systems:  All other systems reviewed and apart from HPI, are negative.  Past Medical History:  Diagnosis Date   Arthritis    Asthma     Past Surgical History:  Procedure Laterality Date   ADENOIDECTOMY     TONSILLECTOMY     WISDOM TOOTH EXTRACTION      Social History:   reports that she has quit smoking. Her smoking use included cigarettes. She has never used smokeless tobacco. She reports that she does not currently use  alcohol. She reports that she does not currently use drugs after having used the following drugs: Marijuana.  Allergies  Allergen Reactions   Penicillins Anaphylaxis and Other (See Comments)    Has patient had a PCN reaction causing immediate rash, facial/tongue/throat swelling, SOB or lightheadedness with hypotension: Yes Has patient had a PCN reaction causing severe rash involving mucus membranes or skin necrosis: No Has patient had a PCN reaction that required hospitalization: No Has patient had a PCN reaction occurring within the last 10 years: No If all of the above answers are "NO", then may proceed with Cephalosporin use.   Egg-Derived Products Itching and Swelling   Peanuts [Peanut Oil] Hives   Sulfa Antibiotics Hives   Latex Rash    Family History  Problem Relation Age of Onset   Diabetes Mother    Hypertension Mother    Hyperlipidemia Mother      Prior to Admission medications   Medication Sig Start Date End Date Taking? Authorizing Provider  albuterol (VENTOLIN HFA) 108 (90 Base) MCG/ACT inhaler Inhale 2 puffs into the lungs every 6 (six) hours as needed for wheezing or shortness of breath. 04/15/23   Sundra Aland, MD  ondansetron (ZOFRAN) 4 MG tablet Take 1 tablet (4 mg total) by mouth every 6 (six) hours. 11/09/22   Netta Corrigan, PA-C  predniSONE (DELTASONE) 20 MG tablet Take 1 tablet (20 mg total) by mouth daily. 04/15/23   Sundra Aland, MD  prenatal vitamin w/FE,  FA (NATACHEW) 29-1 MG CHEW chewable tablet Chew 1 tablet by mouth daily at 12 noon.    [provider]    Physical Exam: Vitals:   04/16/23 2115 04/16/23 2201 04/16/23 2232  BP: 121/76  (!) 146/66  Pulse: 97  (!) 107  Resp: 16  (!) 30  Temp:  98.8 F (37.1 C)   TempSrc:  Oral   SpO2: 100%  100%    Constitutional: NAD, calm  Eyes: PERTLA, lids and conjunctivae normal ENMT: Mucous membranes are moist. Posterior pharynx clear of any exudate or lesions.   Neck: supple, no masses   Respiratory: Tachypneic. Speaking full sentences. Diffuse wheezing. No accessory muscle use.  Cardiovascular: S1 & S2 heard, regular rate and rhythm. No extremity edema.   Abdomen: gravid, no tenderness, soft. Bowel sounds active.  Musculoskeletal: no clubbing / cyanosis. No joint deformity upper and lower extremities.   Skin: no significant rashes, lesions, ulcers. Warm, dry, well-perfused. Neurologic: CN 2-12 grossly intact. Moving all extremities. Alert and oriented.  Psychiatric: Pleasant. Cooperative.    Labs and Imaging on Admission: I have personally reviewed following labs and imaging studies  CBC: Recent Labs  Lab 04/16/23 2200  WBC 16.2*  NEUTROABS 13.1*  HGB 11.6*  HCT 34.0*  MCV 94.2  PLT 203   Basic Metabolic Panel: Recent Labs  Lab 04/16/23 2200  NA 137  K 2.8*  CL 103  CO2 18*  GLUCOSE 122*  BUN 6  CREATININE 0.77  CALCIUM 9.2   GFR: Estimated Creatinine Clearance: 121.2 mL/min (by C-G formula based on SCr of 0.77 mg/dL). Liver Function Tests: No results for input(s): "AST", "ALT", "ALKPHOS", "BILITOT", "PROT", "ALBUMIN" in the last 168 hours. No results for input(s): "LIPASE", "AMYLASE" in the last 168 hours. No results for input(s): "AMMONIA" in the last 168 hours. Coagulation Profile: No results for input(s): "INR", "PROTIME" in the last 168 hours. Cardiac Enzymes: No results for input(s): "CKTOTAL", "CKMB", "CKMBINDEX", "TROPONINI" in the last 168 hours. BNP (last 3 results) No results for input(s): "PROBNP" in the last 8760 hours. HbA1C: No results for input(s): "HGBA1C" in the last 72 hours. CBG: No results for input(s): "GLUCAP" in the last 168 hours. Lipid Profile: No results for input(s): "CHOL", "HDL", "LDLCALC", "TRIG", "CHOLHDL", "LDLDIRECT" in the last 72 hours. Thyroid Function Tests: No results for input(s): "TSH", "T4TOTAL", "FREET4", "T3FREE", "THYROIDAB" in the last 72 hours. Anemia Panel: No results for input(s):  "VITAMINB12", "FOLATE", "FERRITIN", "TIBC", "IRON", "RETICCTPCT" in the last 72 hours. Urine analysis:    Component Value Date/Time   COLORURINE YELLOW 04/15/2023 1940   APPEARANCEUR HAZY (A) 04/15/2023 1940   LABSPEC 1.009 04/15/2023 1940   PHURINE 6.0 04/15/2023 1940   GLUCOSEU NEGATIVE 04/15/2023 1940   HGBUR NEGATIVE 04/15/2023 1940   BILIRUBINUR NEGATIVE 04/15/2023 1940   BILIRUBINUR negative 06/11/2022 1033   KETONESUR NEGATIVE 04/15/2023 1940   PROTEINUR NEGATIVE 04/15/2023 1940   UROBILINOGEN 0.2 06/11/2022 1033   UROBILINOGEN 0.2 05/18/2013 1219   NITRITE NEGATIVE 04/15/2023 1940   LEUKOCYTESUR TRACE (A) 04/15/2023 1940   Sepsis Labs: @LABRCNTIP (procalcitonin:4,lacticidven:4) ) Recent Results (from the past 240 hours)  Resp panel by RT-PCR (RSV, Flu A&B, Covid) Anterior Nasal Swab     Status: None   Collection Time: 04/15/23  7:41 PM   Specimen: Anterior Nasal Swab  Result Value Ref Range Status   SARS Coronavirus 2 by RT PCR NEGATIVE NEGATIVE Final   Influenza A by PCR NEGATIVE NEGATIVE Final   Influenza B by PCR  NEGATIVE NEGATIVE Final    Comment: (NOTE) The Xpert Xpress SARS-CoV-2/FLU/RSV plus assay is intended as an aid in the diagnosis of influenza from Nasopharyngeal swab specimens and should not be used as a sole basis for treatment. Nasal washings and aspirates are unacceptable for Xpert Xpress SARS-CoV-2/FLU/RSV testing.  Fact Sheet for Patients: BloggerCourse.com  Fact Sheet for Healthcare Providers: SeriousBroker.it  This test is not yet approved or cleared by the [place] FDA and has been authorized for detection and/or diagnosis of SARS-CoV-2 by FDA under an Emergency Use Authorization (EUA). This EUA will remain in effect (meaning this test can be used) for the duration of the COVID-19 declaration under Section 564(b)(1) of the Act, 21 U.S.C. section 360bbb-3(b)(1), unless the authorization is  terminated or revoked.     Resp Syncytial Virus by PCR NEGATIVE NEGATIVE Final    Comment: (NOTE) Fact Sheet for Patients: BloggerCourse.com  Fact Sheet for Healthcare Providers: SeriousBroker.it  This test is not yet approved or cleared by the [place] FDA and has been authorized for detection and/or diagnosis of SARS-CoV-2 by FDA under an Emergency Use Authorization (EUA). This EUA will remain in effect (meaning this test can be used) for the duration of the COVID-19 declaration under Section 564(b)(1) of the Act, 21 U.S.C. section 360bbb-3(b)(1), unless the authorization is terminated or revoked.  Performed at [facility], [address] [place], [place] [address]      Radiological Exams on Admission: DG Chest Portable 1 View Result Date: 04/16/2023 CLINICAL DATA:  SOB EXAM: PORTABLE CHEST 1 VIEW COMPARISON:  Chest x-ray 08/17/2021 FINDINGS: Prominent cardiac silhouette likely due to AP portable technique. The heart and mediastinal contours are unchanged. No focal consolidation. No pulmonary edema. No pleural effusion. No pneumothorax. No acute osseous abnormality. IMPRESSION: No active disease. Electronically Signed   By: Tish Frederickson M.D.   On: 04/16/2023 21:37    EKG: Independently reviewed. Sinus tachycardia, rate 102.   Assessment/Plan  1. Acute asthma exacerbation  - Treated with IV Solu-Medrol, albuterol, and Atrovent with EMS, given additional DuoNebs in ED  - Give 2 g IV mag, continue systemic steroid, continue albuterol as-needed, supportive care   2. Pregnancy  - [redacted]w[redacted]d gestation; EDD 07/08/23   - Dr. Donavan Foil consulted by ED, appreciate OBGyn involvement     DVT prophylaxis: Lovenox  Code Status: Full  Level of Care: Level of care: Antepartum Family Communication: Mother and father at bedside   Disposition Plan:  Patient is from: Home  Anticipated d/c is to: Home  Anticipated d/c date is:  04/19/23  Patient currently: Pending improved respiratory status Consults called: OBGYN Admission status: Observation     Briscoe Deutscher, MD [facility]  04/16/2023, 11:47 PM

## 2023-04-16 NOTE — ED Triage Notes (Signed)
Pt from home with [facility] for resp distress. Has been having asthma exacerbation since Sunday. Pt was seen at MAU last night for same. Discharged with steroids. Woke up this morning at 7am with continued sob, EMS called to home and gave additional duonebs. Pt did not transport at that time. Tonight reports worsening sob unrelieved with inhalers at home. EMS gave 10mg  albuterol, 1mg  atrovent, 125mg  Solumedrol IV. 18 g L ac. EMS VS 144/64, pulse 100, cbg 104, spO2 98%. C/o chest tightness   Pt is [redacted]weeks pregnant. Marissa, RROB, at bedside on arrival. Pt denies contractions at this time

## 2023-04-17 DIAGNOSIS — J45909 Unspecified asthma, uncomplicated: Secondary | ICD-10-CM | POA: Diagnosis not present

## 2023-04-17 DIAGNOSIS — O99283 Endocrine, nutritional and metabolic diseases complicating pregnancy, third trimester: Secondary | ICD-10-CM | POA: Diagnosis present

## 2023-04-17 DIAGNOSIS — D72829 Elevated white blood cell count, unspecified: Secondary | ICD-10-CM | POA: Diagnosis present

## 2023-04-17 DIAGNOSIS — Z880 Allergy status to penicillin: Secondary | ICD-10-CM | POA: Diagnosis not present

## 2023-04-17 DIAGNOSIS — Z87891 Personal history of nicotine dependence: Secondary | ICD-10-CM | POA: Diagnosis not present

## 2023-04-17 DIAGNOSIS — Z3A28 28 weeks gestation of pregnancy: Secondary | ICD-10-CM | POA: Diagnosis not present

## 2023-04-17 DIAGNOSIS — O26893 Other specified pregnancy related conditions, third trimester: Secondary | ICD-10-CM

## 2023-04-17 DIAGNOSIS — O99513 Diseases of the respiratory system complicating pregnancy, third trimester: Secondary | ICD-10-CM | POA: Diagnosis present

## 2023-04-17 DIAGNOSIS — Z3A20 20 weeks gestation of pregnancy: Secondary | ICD-10-CM | POA: Diagnosis not present

## 2023-04-17 DIAGNOSIS — E876 Hypokalemia: Secondary | ICD-10-CM | POA: Diagnosis present

## 2023-04-17 DIAGNOSIS — Z8249 Family history of ischemic heart disease and other diseases of the circulatory system: Secondary | ICD-10-CM | POA: Diagnosis not present

## 2023-04-17 DIAGNOSIS — Z833 Family history of diabetes mellitus: Secondary | ICD-10-CM | POA: Diagnosis not present

## 2023-04-17 DIAGNOSIS — Z1152 Encounter for screening for COVID-19: Secondary | ICD-10-CM | POA: Diagnosis not present

## 2023-04-17 DIAGNOSIS — Z91040 Latex allergy status: Secondary | ICD-10-CM | POA: Diagnosis not present

## 2023-04-17 DIAGNOSIS — K219 Gastro-esophageal reflux disease without esophagitis: Secondary | ICD-10-CM | POA: Diagnosis present

## 2023-04-17 DIAGNOSIS — J45901 Unspecified asthma with (acute) exacerbation: Secondary | ICD-10-CM | POA: Diagnosis present

## 2023-04-17 DIAGNOSIS — O99113 Other diseases of the blood and blood-forming organs and certain disorders involving the immune mechanism complicating pregnancy, third trimester: Secondary | ICD-10-CM | POA: Diagnosis present

## 2023-04-17 LAB — BASIC METABOLIC PANEL
Anion gap: 11 (ref 5–15)
BUN: 5 mg/dL — ABNORMAL LOW (ref 6–20)
CO2: 17 mmol/L — ABNORMAL LOW (ref 22–32)
Calcium: 8.9 mg/dL (ref 8.9–10.3)
Chloride: 105 mmol/L (ref 98–111)
Creatinine, Ser: 0.75 mg/dL (ref 0.44–1.00)
GFR, Estimated: >60 mL/min (ref >60)
Glucose, Bld: 144 mg/dL — ABNORMAL HIGH (ref 70–99)
Potassium: 3.5 mmol/L (ref 3.5–5.1)
Sodium: 133 mmol/L — ABNORMAL LOW (ref 135–145)

## 2023-04-17 LAB — CBC
HCT: 30.2 % — ABNORMAL LOW (ref 36.0–46.0)
Hemoglobin: 10.8 g/dL — ABNORMAL LOW (ref 12.0–15.0)
MCH: 33.1 pg (ref 26.0–34.0)
MCHC: 35.8 g/dL (ref 30.0–36.0)
MCV: 92.6 fL (ref 80.0–100.0)
Platelets: 191 10*3/uL (ref 150–400)
RBC: 3.26 MIL/uL — ABNORMAL LOW (ref 3.87–5.11)
RDW: 13.4 % (ref 11.5–15.5)
WBC: 15.3 10*3/uL — ABNORMAL HIGH (ref 4.0–10.5)
nRBC: 0 % (ref 0.0–0.2)

## 2023-04-17 LAB — HIV ANTIBODY (ROUTINE TESTING W REFLEX): HIV Screen 4th Generation wRfx: NONREACTIVE

## 2023-04-17 MED ORDER — SODIUM CHLORIDE 0.9% FLUSH
3.0000 mL | Freq: Two times a day (BID) | INTRAVENOUS | Status: DC
  Administered 2023-04-17 – 2023-04-19 (×5): 10 mL via INTRAVENOUS

## 2023-04-17 MED ORDER — GUAIFENESIN 100 MG/5ML PO LIQD
5.0000 mL | ORAL | Status: DC | PRN
Start: 2023-04-17 — End: 2023-04-18
  Administered 2023-04-17: 5 mL via ORAL
  Filled 2023-04-17: qty 15

## 2023-04-17 MED ORDER — IPRATROPIUM-ALBUTEROL 0.5-2.5 (3) MG/3ML IN SOLN
3.0000 mL | Freq: Four times a day (QID) | RESPIRATORY_TRACT | Status: DC
  Administered 2023-04-17 – 2023-04-20 (×12): 3 mL via RESPIRATORY_TRACT
  Filled 2023-04-17 (×14): qty 3

## 2023-04-17 MED ORDER — SODIUM CHLORIDE 0.9% FLUSH: 3.0000 mL | INTRAVENOUS | Status: DC | PRN

## 2023-04-17 MED ORDER — SODIUM CHLORIDE 0.9 % IV SOLN: INTRAVENOUS | Status: AC

## 2023-04-17 MED ORDER — MENTHOL 3 MG MT LOZG
1.0000 | LOZENGE | OROMUCOSAL | Status: DC | PRN
Start: 2023-04-17 — End: 2023-04-20

## 2023-04-17 MED ORDER — CALCIUM CARBONATE ANTACID 500 MG PO CHEW
2.0000 | CHEWABLE_TABLET | ORAL | Status: DC | PRN
  Administered 2023-04-17 – 2023-04-20 (×4): 400 mg via ORAL
  Filled 2023-04-17 (×4): qty 2

## 2023-04-17 MED ORDER — DOCUSATE SODIUM 100 MG PO CAPS
100.0000 mg | ORAL_CAPSULE | Freq: Every day | ORAL | Status: DC
Start: 2023-04-17 — End: 2023-04-20
  Filled 2023-04-17 (×2): qty 1

## 2023-04-17 MED ORDER — MOMETASONE FURO-FORMOTEROL FUM 100-5 MCG/ACT IN AERO
2.0000 | INHALATION_SPRAY | Freq: Two times a day (BID) | RESPIRATORY_TRACT | Status: DC
  Administered 2023-04-17 – 2023-04-20 (×7): 2 via RESPIRATORY_TRACT
  Filled 2023-04-17: qty 8.8

## 2023-04-17 NOTE — Progress Notes (Signed)
[facility]                                                                              Misty Sanders, is a 28 y.o. female, DOB - 03-12-1995, FIE:[id number] Admit date - 04/16/2023    Outpatient Primary MD for the patient is Storm Frisk, MD  LOS - 0  days  Chief Complaint  Patient presents with   Shortness of Breath       Brief summary   Patient is a 28yo female G1P0000, [redacted] weeks pregnant with medical history of asthma presented with scratchy throat, shortness of breath with wheezing.  Patient reported symptoms began on 04/13/2023 when she had a cough, wheezing with shortness of breath, was evaluated at [facility] on 2/18, treated with systemic steroids, bronchodilators, improved and was discharged with albuterol prescription.  Unfortunately shortness of breath worsened, EMS was called.  No leg swelling or abdominal pain. In ED, patient is found to be afebrile, tachypneic, and tachycardic with stable blood pressure.  Labs are most notable for potassium 2.8 and WBC 16,200.  Chest x-ray is negative for acute findings.  COVID, RSV, and influenza PCR are negative.   Patient was noted for further workup.  OB was consulted.  Assessment & Plan    Principal Problem:   Acute asthma exacerbation -Currently improving, does not feel at baseline yet.  Chest x-ray showed no infiltrates. -Placed on scheduled nebs every 6 hours, added Dulera twice daily -Continue IV Solu-Medrol 80 mg daily -Flutter valve   Active Problems: Hypokalemia -Resolved    [redacted] weeks gestation of pregnancy -Per OB    Estimated body mass index is 31.95 kg/m as calculated from the following:   Height as of 04/15/23: 5\' 7"  (1.702 m).   Weight as of 04/15/23: 92.5 kg.  Code Status: Full code DVT Prophylaxis:  enoxaparin (LOVENOX) injection 40 mg Start: 04/17/23 1000 SCDs Start: 04/17/23 0224   Level of Care: Level of care: Antepartum Family Communication: Updated patient Disposition Plan:       Remains inpatient appropriate: Hopefully DC next 24 to 48 hours if continues to improve  Consultants:   OB  Antimicrobials:   Anti-infectives (From admission, onward)    None          Medications  docusate sodium  100 mg Oral Daily   enoxaparin (LOVENOX) injection  40 mg Subcutaneous Daily   ipratropium-albuterol  3 mL Nebulization Q6H   methylPREDNISolone (SOLU-MEDROL) injection  80 mg Intravenous Daily   mometasone-formoterol  2 puff Inhalation BID   prenatal vitamin w/FE, FA  1 tablet Oral Q1200   sodium chloride flush  3-10 mL Intravenous Q12H      Subjective:   Misty Sanders was seen and examined today.  Still feels shortness of breath and wheezing, no fevers, chest pain, cough or any productive phlegm.  Has been using her own inhaler in the room.  Objective:   Vitals:   04/17/23 0321 04/17/23 0758 04/17/23 0936 04/17/23 0947  BP:  (!) 95/43    Pulse:  80    Resp:  20    Temp:  97.8 F (36.6 C)  TempSrc:  Oral    SpO2: 99% 96% 100% 100%   No intake or output data in the 24 hours ending 04/17/23 1051   Wt Readings from Last 3 Encounters:  04/15/23 92.5 kg  02/24/23 78.9 kg  11/15/22 78.9 kg     Exam General: Alert and oriented x 3, NAD Cardiovascular: S1 S2 auscultated,  RRR Respiratory: Bilateral scattered expiratory wheezing Gastrointestinal: Gravid, no tenderness, NBS Ext: no pedal edema bilaterally Neuro: no new deficits Psych: Normal affect     Data Reviewed:  I have personally reviewed following labs    CBC Lab Results  Component Value Date   WBC 15.3 (H) 04/17/2023   RBC 3.26 (L) 04/17/2023   HGB 10.8 (L) 04/17/2023   HCT 30.2 (L) 04/17/2023   MCV 92.6 04/17/2023   MCH 33.1 04/17/2023   PLT 191 04/17/2023   MCHC 35.8 04/17/2023   RDW 13.4 04/17/2023   LYMPHSABS 2.1 04/16/2023   MONOABS 0.8 04/16/2023   EOSABS 0.0 04/16/2023   BASOSABS 0.0 04/16/2023     Last metabolic panel Lab Results  Component Value Date   NA  133 (L) 04/17/2023   K 3.5 04/17/2023   CL 105 04/17/2023   CO2 17 (L) 04/17/2023   BUN 5 (L) 04/17/2023   CREATININE 0.75 04/17/2023   GLUCOSE 144 (H) 04/17/2023   GFRNONAA >60 04/17/2023   GFRAA 91 04/10/2020   CALCIUM 8.9 04/17/2023   PROT 6.7 11/15/2022   ALBUMIN 3.4 (L) 11/15/2022   LABGLOB 2.5 04/10/2020   AGRATIO 1.6 04/10/2020   BILITOT 0.2 (L) 11/15/2022   ALKPHOS 59 11/15/2022   AST 17 11/15/2022   ALT 14 11/15/2022   ANIONGAP 11 04/17/2023    CBG (last 3)  No results for input(s): "GLUCAP" in the last 72 hours.    Coagulation Profile: No results for input(s): "INR", "PROTIME" in the last 168 hours.   Radiology Studies: I have personally reviewed the imaging studies  DG Chest Portable 1 View Result Date: 04/16/2023 CLINICAL DATA:  SOB EXAM: PORTABLE CHEST 1 VIEW COMPARISON:  Chest x-ray 08/17/2021 FINDINGS: Prominent cardiac silhouette likely due to AP portable technique. The heart and mediastinal contours are unchanged. No focal consolidation. No pulmonary edema. No pleural effusion. No pneumothorax. No acute osseous abnormality. IMPRESSION: No active disease. Electronically Signed   By: Tish Frederickson M.D.   On: 04/16/2023 21:37       Zaedyn Covin M.D. [facility] 04/17/2023, 10:51 AM  Available via Epic secure chat 7am-7pm After 7 pm, please refer to night coverage provider listed on amion.

## 2023-04-17 NOTE — Consult Note (Signed)
OB/GYN Consult Note  Referring Provider: Clementine Sanders is a 28 y.o. G1P0000 admitted for asthma exacerbation. OB consulted for aid in obstetrical care. Pt receives her care at [facility], but lives in [place].  Pt states her last issue with asthma was about 2020.  She really has had no issues this pregnancy.Pt denies difficulties with blood pressure, diabetes or kidney problems.  Pt states she noticed a scratchy throat on Sunday.  Symptoms worsened enough that she was seen in [facility] on 04/15/23 and was given rx for prednisone and albuterol inhaler which she picked up.  Symptoms were worse this AM and the new meds were not effective.  She called EMS who gave her breathing treatments and advised assessment in ER.  Pt initially declined but has symptoms worsened she [facility] for asthma assessment and treatment.  Pt denies any sick contacts over the last week.  Pt notes positive fetal movement, no loss of fluid or bleeding and no contractions.      Past Medical History:  Diagnosis Date   Arthritis    Asthma     Past Surgical History:  Procedure Laterality Date   ADENOIDECTOMY     TONSILLECTOMY     WISDOM TOOTH EXTRACTION      OB History  Gravida Para Term Preterm AB Living  1 0 0 0 0 0  SAB IAB Ectopic Multiple Live Births  0 0 0 0 0    # Outcome Date GA Lbr Len/2nd Weight Sex Type Anes PTL Lv  1 Current             Social History   Socioeconomic History   Marital status: Single    Spouse name: Not on file   Number of children: Not on file   Years of education: Not on file   Highest education level: Not on file  Occupational History   Not on file  Tobacco Use   Smoking status: Former    Current packs/day: 0.25    Types: Cigarettes   Smokeless tobacco: Never  Vaping Use   Vaping status: Never Used  Substance and Sexual Activity   Alcohol use: Not Currently   Drug use: Not Currently    Types: Marijuana    Comment: denies any use  currently   Sexual activity: Not Currently    Birth control/protection: None  Other Topics Concern   Not on file  Social History Narrative   Not on file   Social Drivers of Health   Financial Resource Strain: Not on file  Food Insecurity: Low Risk  (02/11/2023)   Received from [facility]   Hunger Vital Sign    Worried About Running Out of Food in the Last Year: Never true    Ran Out of Food in the Last Year: Never true  Transportation Needs: No Transportation Needs (02/11/2023)   Received from [facility]    In the past 12 months, has lack of reliable transportation kept you from medical appointments, meetings, work or from getting things needed for daily living? : No  Physical Activity: Not on file  Stress: Not on file  Social Connections: Not on file    Family History  Problem Relation Age of Onset   Diabetes Mother    Hypertension Mother    Hyperlipidemia Mother     Medications Prior to Admission  Medication Sig Dispense Refill Last Dose/Taking   prenatal vitamin w/FE, FA (NATACHEW) 29-1 MG  CHEW chewable tablet Chew 1 tablet by mouth daily at 12 noon.   04/16/2023 Noon    Allergies  Allergen Reactions   Fish Allergy Anaphylaxis and Other (See Comments)    Face swelling, throat swells, itching   Penicillins Anaphylaxis and Other (See Comments)    Has patient had a PCN reaction causing immediate rash, facial/tongue/throat swelling, SOB or lightheadedness with hypotension: Yes Has patient had a PCN reaction causing severe rash involving mucus membranes or skin necrosis: No Has patient had a PCN reaction that required hospitalization: No Has patient had a PCN reaction occurring within the last 10 years: No If all of the above answers are "NO", then may proceed with Cephalosporin use.   Egg-Derived Products Itching and Swelling   Peanuts [Peanut Oil] Hives   Sulfa Antibiotics Hives   Latex Rash    Review of Systems: Negative except for what is  mentioned in HPI.     Physical Exam: BP 134/68 (BP Location: Right Arm)   Pulse 90   Temp 98.5 F (36.9 C) (Oral)   Resp (!) 24   LMP 10/01/2022 Comment: neg preg test 07/30/2022  SpO2 100%  CONSTITUTIONAL: Well-developed, well-nourished female in minimal acute distress.  HENT:  Normocephalic, atraumatic, External right and left ear normal. Oropharynx is clear and moist EYES: Conjunctivae and EOM are normal.  NECK: Normal range of motion, supple, no masses SKIN: Skin is warm and dry. No rash noted. Not diaphoretic. No erythema. No pallor. NEUROLGIC: Alert and oriented to person, place, and time. Normal reflexes, muscle tone coordination. No cranial nerve deficit noted. PSYCHIATRIC: Normal mood and affect. Normal behavior. Normal judgment and thought content. CARDIOVASCULAR:mild tachycardia, regular rhythm RESPIRATORY: Effort normal, bilateral mild expiratory wheezes ABDOMEN: Soft, nontender, nondistended, gravid.  PELVIC: deferred MUSCULOSKELETAL: Normal range of motion. No edema and no tenderness. 2+ distal pulses.  NST:  baseline 130s, moderate variability, accels noted, occasional small variable decels, category 1 strip  Pertinent Labs/Studies:   Results for orders placed or performed during the hospital encounter of 04/16/23 (from the past 72 hours)  Resp panel by RT-PCR (RSV, Flu A&B, Covid) Anterior Nasal Swab     Status: None   Collection Time: 04/16/23  9:25 PM   Specimen: Anterior Nasal Swab  Result Value Ref Range   SARS Coronavirus 2 by RT PCR NEGATIVE NEGATIVE   Influenza A by PCR NEGATIVE NEGATIVE   Influenza B by PCR NEGATIVE NEGATIVE    Comment: (NOTE) The Xpert Xpress SARS-CoV-2/FLU/RSV plus assay is intended as an aid in the diagnosis of influenza from Nasopharyngeal swab specimens and should not be used as a sole basis for treatment. Nasal washings and aspirates are unacceptable for Xpert Xpress SARS-CoV-2/FLU/RSV testing.  Fact Sheet for  Patients: BloggerCourse.com  Fact Sheet for Healthcare Providers: SeriousBroker.it  This test is not yet approved or cleared by the [place] FDA and has been authorized for detection and/or diagnosis of SARS-CoV-2 by FDA under an Emergency Use Authorization (EUA). This EUA will remain in effect (meaning this test can be used) for the duration of the COVID-19 declaration under Section 564(b)(1) of the Act, 21 U.S.C. section 360bbb-3(b)(1), unless the authorization is terminated or revoked.     Resp Syncytial Virus by PCR NEGATIVE NEGATIVE    Comment: (NOTE) Fact Sheet for Patients: BloggerCourse.com  Fact Sheet for Healthcare Providers: SeriousBroker.it  This test is not yet approved or cleared by the [place] FDA and has been authorized for detection and/or diagnosis of SARS-CoV-2 by  FDA under an Emergency Use Authorization (EUA). This EUA will remain in effect (meaning this test can be used) for the duration of the COVID-19 declaration under Section 564(b)(1) of the Act, 21 U.S.C. section 360bbb-3(b)(1), unless the authorization is terminated or revoked.  Performed at [facility], [address], [place], [place] [address]   Basic metabolic panel     Status: Abnormal   Collection Time: 04/16/23 10:00 PM  Result Value Ref Range   Sodium 137 135 - 145 mmol/L   Potassium 2.8 (L) 3.5 - 5.1 mmol/L   Chloride 103 98 - 111 mmol/L   CO2 18 (L) 22 - 32 mmol/L   Glucose, Bld 122 (H) 70 - 99 mg/dL    Comment: Glucose reference range applies only to samples taken after fasting for at least 8 hours.   BUN 6 6 - 20 mg/dL   Creatinine, Ser 8.46 0.44 - 1.00 mg/dL   Calcium 9.2 8.9 - 96.2 mg/dL   GFR, Estimated >95 >28 mL/min    Comment: (NOTE) Calculated using the CKD-EPI Creatinine Equation (2021)    Anion gap 16 (H) 5 - 15    Comment: Performed at [facility], [address], [place], [place] [address]  CBC with Differential     Status: Abnormal   Collection Time: 04/16/23 10:00 PM  Result Value Ref Range   WBC 16.2 (H) 4.0 - 10.5 K/uL   RBC 3.61 (L) 3.87 - 5.11 MIL/uL   Hemoglobin 11.6 (L) 12.0 - 15.0 g/dL   HCT 40.1 (L) 02.7 - 25.3 %   MCV 94.2 80.0 - 100.0 fL   MCH 32.1 26.0 - 34.0 pg   MCHC 34.1 30.0 - 36.0 g/dL   RDW 66.4 40.3 - 47.4 %   Platelets 203 150 - 400 K/uL   nRBC 0.0 0.0 - 0.2 %   Neutrophils Relative % 81 %   Neutro Abs 13.1 (H) 1.7 - 7.7 K/uL   Lymphocytes Relative 13 %   Lymphs Abs 2.1 0.7 - 4.0 K/uL   Monocytes Relative 5 %   Monocytes Absolute 0.8 0.1 - 1.0 K/uL   Eosinophils Relative 0 %   Eosinophils Absolute 0.0 0.0 - 0.5 K/uL   Basophils Relative 0 %   Basophils Absolute 0.0 0.0 - 0.1 K/uL   Immature Granulocytes 1 %   Abs Immature Granulocytes 0.13 (H) 0.00 - 0.07 K/uL    Comment: Performed at [facility], [address] [place], [place] [address]  Troponin I (High Sensitivity)     Status: None   Collection Time: 04/16/23 10:00 PM  Result Value Ref Range   Troponin I (High Sensitivity) 9 <18 ng/L    Comment: (NOTE) Elevated high sensitivity troponin I (hsTnI) values and significant  changes across serial measurements may suggest ACS but many other  chronic and acute conditions are known to elevate hsTnI results.  Refer to the "Links" section for chest pain algorithms and additional  guidance. Performed at [facility], [address], [place], [place] [address]        Assessment and Plan :Misty Sanders is a 28 y.o. G1P0000 at 28 wks 2 days admitted for asthma exacerbation Asthma care per [facility]   Thank you for this consult, we will follow along, please call or re-consult with further questions. At this point daily NST and routine prenatal care.   For OB/GYN questions, please call the Center for [facility] at [facility]  Hospital Faculty Practice Monday  - Friday, 8 am - 5 pm: [phone] All other times: [phone]    Mariel Aloe, M.D. Attending Obstetrician & Gynecologist, [facility] for [employer], [facility]

## 2023-04-18 DIAGNOSIS — Z3A20 20 weeks gestation of pregnancy: Secondary | ICD-10-CM

## 2023-04-18 DIAGNOSIS — J45909 Unspecified asthma, uncomplicated: Secondary | ICD-10-CM

## 2023-04-18 DIAGNOSIS — J45901 Unspecified asthma with (acute) exacerbation: Secondary | ICD-10-CM | POA: Diagnosis not present

## 2023-04-18 DIAGNOSIS — O99513 Diseases of the respiratory system complicating pregnancy, third trimester: Principal | ICD-10-CM

## 2023-04-18 LAB — BASIC METABOLIC PANEL
Anion gap: 7 (ref 5–15)
BUN: <5 mg/dL — ABNORMAL LOW (ref 6–20)
CO2: 20 mmol/L — ABNORMAL LOW (ref 22–32)
Calcium: 8.5 mg/dL — ABNORMAL LOW (ref 8.9–10.3)
Chloride: 110 mmol/L (ref 98–111)
Creatinine, Ser: 0.66 mg/dL (ref 0.44–1.00)
GFR, Estimated: >60 mL/min (ref >60)
Glucose, Bld: 82 mg/dL (ref 70–99)
Potassium: 3.4 mmol/L — ABNORMAL LOW (ref 3.5–5.1)
Sodium: 137 mmol/L (ref 135–145)

## 2023-04-18 LAB — CBC
HCT: 28.6 % — ABNORMAL LOW (ref 36.0–46.0)
Hemoglobin: 9.8 g/dL — ABNORMAL LOW (ref 12.0–15.0)
MCH: 31.9 pg (ref 26.0–34.0)
MCHC: 34.3 g/dL (ref 30.0–36.0)
MCV: 93.2 fL (ref 80.0–100.0)
Platelets: 179 10*3/uL (ref 150–400)
RBC: 3.07 MIL/uL — ABNORMAL LOW (ref 3.87–5.11)
RDW: 13.8 % (ref 11.5–15.5)
WBC: 14.7 10*3/uL — ABNORMAL HIGH (ref 4.0–10.5)
nRBC: 0 % (ref 0.0–0.2)

## 2023-04-18 MED ORDER — POTASSIUM CHLORIDE CRYS ER 20 MEQ PO TBCR
40.0000 meq | EXTENDED_RELEASE_TABLET | Freq: Once | ORAL | Status: AC
  Administered 2023-04-18: 40 meq via ORAL
  Filled 2023-04-18: qty 2

## 2023-04-18 MED ORDER — DM-GUAIFENESIN ER 30-600 MG PO TB12
1.0000 | ORAL_TABLET | Freq: Two times a day (BID) | ORAL | Status: DC | PRN
  Administered 2023-04-18 – 2023-04-19 (×2): 1 via ORAL
  Filled 2023-04-18 (×3): qty 1

## 2023-04-18 NOTE — Progress Notes (Signed)
FACULTY PRACTICE ANTEPARTUM PROGRESS NOTE  Misty Sanders is a 28 y.o. G1P0000 at [redacted]w[redacted]d who is admitted for asthma exacerbation.  Estimated Date of Delivery: 07/08/23 Fetal presentation is unsure.  Length of Stay:  1 Days. Admitted 04/16/2023  Subjective: Pt seen.  Noted some mild abdominal pain after coughing fit last evening. Patient reports normal fetal movement.  She denies uterine contractions, denies bleeding and leaking of fluid per vagina.  Vitals:  Blood pressure (!) 114/59, pulse 92, temperature 97.6 F (36.4 C), temperature source Oral, resp. rate 19, last menstrual period 10/01/2022, SpO2 98%. Physical Examination: CONSTITUTIONAL: Well-developed, well-nourished female in no acute distress.  HENT:  Normocephalic, atraumatic, External right and left ear normal. Oropharynx is clear and moist EYES: Conjunctivae and EOM are normal.  NECK: Normal range of motion, supple, no masses. SKIN: Skin is warm and dry. No rash noted. Not diaphoretic. No erythema. No pallor. NEUROLGIC: Alert and oriented to person, place, and time. Normal reflexes, muscle tone coordination. No cranial nerve deficit noted. PSYCHIATRIC: Normal mood and affect. Normal behavior. Normal judgment and thought content. CARDIOVASCULAR: Normal heart rate noted, regular rhythm RESPIRATORY: Effort normal, bilateral expiratory wheezes noted with occasional light rhonci MUSCULOSKELETAL: Normal range of motion. No edema and no tenderness. ABDOMEN: Soft, nontender, nondistended, gravid. CERVIX: deferred  Fetal monitoring: FHR: 140 bpm,   Results for orders placed or performed during the hospital encounter of 04/16/23 (from the past 48 hours)  Resp panel by RT-PCR (RSV, Flu A&B, Covid) Anterior Nasal Swab     Status: None   Collection Time: 04/16/23  9:25 PM   Specimen: Anterior Nasal Swab  Result Value Ref Range   SARS Coronavirus 2 by RT PCR NEGATIVE NEGATIVE   Influenza A by PCR NEGATIVE NEGATIVE   Influenza B by  PCR NEGATIVE NEGATIVE    Comment: (NOTE) The Xpert Xpress SARS-CoV-2/FLU/RSV plus assay is intended as an aid in the diagnosis of influenza from Nasopharyngeal swab specimens and should not be used as a sole basis for treatment. Nasal washings and aspirates are unacceptable for Xpert Xpress SARS-CoV-2/FLU/RSV testing.  Fact Sheet for Patients: BloggerCourse.com  Fact Sheet for Healthcare Providers: SeriousBroker.it  This test is not yet approved or cleared by the [place] FDA and has been authorized for detection and/or diagnosis of SARS-CoV-2 by FDA under an Emergency Use Authorization (EUA). This EUA will remain in effect (meaning this test can be used) for the duration of the COVID-19 declaration under Section 564(b)(1) of the Act, 21 U.S.C. section 360bbb-3(b)(1), unless the authorization is terminated or revoked.     Resp Syncytial Virus by PCR NEGATIVE NEGATIVE    Comment: (NOTE) Fact Sheet for Patients: BloggerCourse.com  Fact Sheet for Healthcare Providers: SeriousBroker.it  This test is not yet approved or cleared by the [place] FDA and has been authorized for detection and/or diagnosis of SARS-CoV-2 by FDA under an Emergency Use Authorization (EUA). This EUA will remain in effect (meaning this test can be used) for the duration of the COVID-19 declaration under Section 564(b)(1) of the Act, 21 U.S.C. section 360bbb-3(b)(1), unless the authorization is terminated or revoked.  Performed at [facility], [address], [place], [place] [address]   Basic metabolic panel     Status: Abnormal   Collection Time: 04/16/23 10:00 PM  Result Value Ref Range   Sodium 137 135 - 145 mmol/L   Potassium 2.8 (L) 3.5 - 5.1 mmol/L   Chloride 103 98 - 111 mmol/L   CO2  18 (L) 22 - 32 mmol/L   Glucose, Bld 122 (H) 70 - 99 mg/dL    Comment: Glucose reference range  applies only to samples taken after fasting for at least 8 hours.   BUN 6 6 - 20 mg/dL   Creatinine, Ser 1.61 0.44 - 1.00 mg/dL   Calcium 9.2 8.9 - 09.6 mg/dL   GFR, Estimated >04 >54 mL/min    Comment: (NOTE) Calculated using the CKD-EPI Creatinine Equation (2021)    Anion gap 16 (H) 5 - 15    Comment: Performed at [facility], [address] [place], [place] [address]  CBC with Differential     Status: Abnormal   Collection Time: 04/16/23 10:00 PM  Result Value Ref Range   WBC 16.2 (H) 4.0 - 10.5 K/uL   RBC 3.61 (L) 3.87 - 5.11 MIL/uL   Hemoglobin 11.6 (L) 12.0 - 15.0 g/dL   HCT 91.4 (L) 78.2 - 95.6 %   MCV 94.2 80.0 - 100.0 fL   MCH 32.1 26.0 - 34.0 pg   MCHC 34.1 30.0 - 36.0 g/dL   RDW 21.3 08.6 - 57.8 %   Platelets 203 150 - 400 K/uL   nRBC 0.0 0.0 - 0.2 %   Neutrophils Relative % 81 %   Neutro Abs 13.1 (H) 1.7 - 7.7 K/uL   Lymphocytes Relative 13 %   Lymphs Abs 2.1 0.7 - 4.0 K/uL   Monocytes Relative 5 %   Monocytes Absolute 0.8 0.1 - 1.0 K/uL   Eosinophils Relative 0 %   Eosinophils Absolute 0.0 0.0 - 0.5 K/uL   Basophils Relative 0 %   Basophils Absolute 0.0 0.0 - 0.1 K/uL   Immature Granulocytes 1 %   Abs Immature Granulocytes 0.13 (H) 0.00 - 0.07 K/uL    Comment: Performed at [facility], [address], [place], [place] [address]  Troponin I (High Sensitivity)     Status: None   Collection Time: 04/16/23 10:00 PM  Result Value Ref Range   Troponin I (High Sensitivity) 9 <18 ng/L    Comment: (NOTE) Elevated high sensitivity troponin I (hsTnI) values and significant  changes across serial measurements may suggest ACS but many other  chronic and acute conditions are known to elevate hsTnI results.  Refer to the "Links" section for chest pain algorithms and additional  guidance. Performed at [facility], [address] [place], [place] [address]   HIV Antibody (routine testing w rflx)     Status: None   Collection Time: 04/17/23  5:23 AM   Result Value Ref Range   HIV Screen 4th Generation wRfx Non Reactive Non Reactive    Comment: Performed at [facility], [address], [place], [place] [address]  Basic metabolic panel     Status: Abnormal   Collection Time: 04/17/23  5:23 AM  Result Value Ref Range   Sodium 133 (L) 135 - 145 mmol/L   Potassium 3.5 3.5 - 5.1 mmol/L   Chloride 105 98 - 111 mmol/L   CO2 17 (L) 22 - 32 mmol/L   Glucose, Bld 144 (H) 70 - 99 mg/dL    Comment: Glucose reference range applies only to samples taken after fasting for at least 8 hours.   BUN 5 (L) 6 - 20 mg/dL   Creatinine, Ser 0.10 0.44 - 1.00 mg/dL   Calcium 8.9 8.9 - 27.2 mg/dL   GFR, Estimated >53 >66 mL/min    Comment: (NOTE) Calculated  using the CKD-EPI Creatinine Equation (2021)    Anion gap 11 5 - 15    Comment: Performed at [facility], [address] [place], [place] [address]  CBC     Status: Abnormal   Collection Time: 04/17/23  5:23 AM  Result Value Ref Range   WBC 15.3 (H) 4.0 - 10.5 K/uL   RBC 3.26 (L) 3.87 - 5.11 MIL/uL   Hemoglobin 10.8 (L) 12.0 - 15.0 g/dL   HCT 44.0 (L) 10.2 - 72.5 %   MCV 92.6 80.0 - 100.0 fL   MCH 33.1 26.0 - 34.0 pg   MCHC 35.8 30.0 - 36.0 g/dL   RDW 36.6 44.0 - 34.7 %   Platelets 191 150 - 400 K/uL   nRBC 0.0 0.0 - 0.2 %    Comment: Performed at [facility], [address] [place], [place] [address]    I have reviewed the patient's current medications.  ASSESSMENT: Principal Problem:   Acute asthma exacerbation Active Problems:   [redacted] weeks gestation of pregnancy   Hypokalemia   PLAN: Continue routine prenatal care No acute obstetrical issues Respiratory care per Hospitalist. Will follow   Continue routine antenatal care.   Mariel Aloe, MD [facility] Faculty Attending, Center for [facility] 04/18/2023 7:15 AM

## 2023-04-18 NOTE — Progress Notes (Signed)
PROGRESS NOTE    RHIANNE SOMAN  UJW:[id number] DOB: Jul 23, 1995 DOA: 04/16/2023 PCP: Storm Frisk, MD     Brief Narrative:  Misty Sanders is a 28yo female G1P0000, [redacted] weeks pregnant with medical history of asthma presented with scratchy throat, shortness of breath with wheezing.  Patient reported symptoms began on 04/13/2023 when she had a cough, wheezing with shortness of breath, was evaluated at [facility] on [facility], treated with systemic steroids, bronchodilators, improved and was discharged with albuterol prescription.  Unfortunately shortness of breath worsened, EMS was called.  Chest x-ray is negative for acute findings.  COVID, RSV, and influenza PCR are negative.  Patient has been admitted for asthma exacerbation.  New events last 24 hours / Subjective: States that she is feeling unwell, continues to have chest tightness and shortness of breath  Assessment & Plan:   Principal Problem:   Acute asthma exacerbation Active Problems:   [redacted] weeks gestation of pregnancy   Hypokalemia   Acute asthma exacerbation -COVID, influenza, RSV negative -Continue Solu-Medrol, nebs, Dulera -Remains on room air  28-weeks gestation of pregnancy -OB service following  Hypokalemia -Replace  Leukocytosis -In setting of steroid use    DVT prophylaxis:  enoxaparin (LOVENOX) injection 40 mg Start: 04/17/23 1000 SCDs Start: 04/17/23 0224  Code Status: Full code Family Communication: None at bedside Disposition Plan: Home Status is: Inpatient Remains inpatient appropriate because: IV steroids    Antimicrobials:  Anti-infectives (From admission, onward)    None        Objective: Vitals:   04/17/23 2338 04/18/23 0356 04/18/23 0815 04/18/23 0935  BP: 102/64 (!) 114/59 (!) 105/50   Pulse: 92 92 74   Resp: 20 19 17    Temp: 98.3 F (36.8 C) 97.6 F (36.4 C) 98.2 F (36.8 C)   TempSrc: Oral Oral Oral   SpO2: 100% 98% 99% 100%   No intake or output data in the 24 hours  ending 04/18/23 1040 There were no vitals filed for this visit.  Examination:  General exam: Appears calm and comfortable  Respiratory system: Wheezes bilaterally, respiratory effort is normal without distress, no conversational dyspnea, room air Cardiovascular system: S1 & S2 heard, RRR. No murmurs. No pedal edema. Gastrointestinal system: Gravid abdomen Central nervous system: Alert and oriented.  Extremities: Symmetric in appearance  Skin: No rashes, lesions or ulcers on exposed skin    Data Reviewed: I have personally reviewed following labs and imaging studies  CBC: Recent Labs  Lab 04/16/23 2200 04/17/23 0523 04/18/23 0910  WBC 16.2* 15.3* 14.7*  NEUTROABS 13.1*  --   --   HGB 11.6* 10.8* 9.8*  HCT 34.0* 30.2* 28.6*  MCV 94.2 92.6 93.2  PLT 203 191 179   Basic Metabolic Panel: Recent Labs  Lab 04/16/23 2200 04/17/23 0523 04/18/23 0910  NA 137 133* 137  K 2.8* 3.5 3.4*  CL 103 105 110  CO2 18* 17* 20*  GLUCOSE 122* 144* 82  BUN 6 5* <5*  CREATININE 0.77 0.75 0.66  CALCIUM 9.2 8.9 8.5*   GFR: Estimated Creatinine Clearance: 121.2 mL/min (by C-G formula based on SCr of 0.66 mg/dL). Liver Function Tests: No results for input(s): "AST", "ALT", "ALKPHOS", "BILITOT", "PROT", "ALBUMIN" in the last 168 hours. No results for input(s): "LIPASE", "AMYLASE" in the last 168 hours. No results for input(s): "AMMONIA" in the last 168 hours. Coagulation Profile: No results for input(s): "INR", "PROTIME" in the last 168 hours. Cardiac Enzymes: No results for input(s): "CKTOTAL", "CKMB", "CKMBINDEX", "TROPONINI"  in the last 168 hours. BNP (last 3 results) No results for input(s): "PROBNP" in the last 8760 hours. HbA1C: No results for input(s): "HGBA1C" in the last 72 hours. CBG: No results for input(s): "GLUCAP" in the last 168 hours. Lipid Profile: No results for input(s): "CHOL", "HDL", "LDLCALC", "TRIG", "CHOLHDL", "LDLDIRECT" in the last 72 hours. Thyroid Function  Tests: No results for input(s): "TSH", "T4TOTAL", "FREET4", "T3FREE", "THYROIDAB" in the last 72 hours. Anemia Panel: No results for input(s): "VITAMINB12", "FOLATE", "FERRITIN", "TIBC", "IRON", "RETICCTPCT" in the last 72 hours. Sepsis Labs: No results for input(s): "PROCALCITON", "LATICACIDVEN" in the last 168 hours.  Recent Results (from the past 240 hours)  Resp panel by RT-PCR (RSV, Flu A&B, Covid) Anterior Nasal Swab     Status: None   Collection Time: 04/15/23  7:41 PM   Specimen: Anterior Nasal Swab  Result Value Ref Range Status   SARS Coronavirus 2 by RT PCR NEGATIVE NEGATIVE Final   Influenza A by PCR NEGATIVE NEGATIVE Final   Influenza B by PCR NEGATIVE NEGATIVE Final    Comment: (NOTE) The Xpert Xpress SARS-CoV-2/FLU/RSV plus assay is intended as an aid in the diagnosis of influenza from Nasopharyngeal swab specimens and should not be used as a sole basis for treatment. Nasal washings and aspirates are unacceptable for Xpert Xpress SARS-CoV-2/FLU/RSV testing.  Fact Sheet for Patients: BloggerCourse.com  Fact Sheet for Healthcare Providers: SeriousBroker.it  This test is not yet approved or cleared by the [place] FDA and has been authorized for detection and/or diagnosis of SARS-CoV-2 by FDA under an Emergency Use Authorization (EUA). This EUA will remain in effect (meaning this test can be used) for the duration of the COVID-19 declaration under Section 564(b)(1) of the Act, 21 U.S.C. section 360bbb-3(b)(1), unless the authorization is terminated or revoked.     Resp Syncytial Virus by PCR NEGATIVE NEGATIVE Final    Comment: (NOTE) Fact Sheet for Patients: BloggerCourse.com  Fact Sheet for Healthcare Providers: SeriousBroker.it  This test is not yet approved or cleared by the [place] FDA and has been authorized for detection and/or diagnosis of  SARS-CoV-2 by FDA under an Emergency Use Authorization (EUA). This EUA will remain in effect (meaning this test can be used) for the duration of the COVID-19 declaration under Section 564(b)(1) of the Act, 21 U.S.C. section 360bbb-3(b)(1), unless the authorization is terminated or revoked.  Performed at [facility], [address], [place], [place] [address]   Resp panel by RT-PCR (RSV, Flu A&B, Covid) Anterior Nasal Swab     Status: None   Collection Time: 04/16/23  9:25 PM   Specimen: Anterior Nasal Swab  Result Value Ref Range Status   SARS Coronavirus 2 by RT PCR NEGATIVE NEGATIVE Final   Influenza A by PCR NEGATIVE NEGATIVE Final   Influenza B by PCR NEGATIVE NEGATIVE Final    Comment: (NOTE) The Xpert Xpress SARS-CoV-2/FLU/RSV plus assay is intended as an aid in the diagnosis of influenza from Nasopharyngeal swab specimens and should not be used as a sole basis for treatment. Nasal washings and aspirates are unacceptable for Xpert Xpress SARS-CoV-2/FLU/RSV testing.  Fact Sheet for Patients: BloggerCourse.com  Fact Sheet for Healthcare Providers: SeriousBroker.it  This test is not yet approved or cleared by the [place] FDA and has been authorized for detection and/or diagnosis of SARS-CoV-2 by FDA under an Emergency Use Authorization (EUA). This EUA will remain in effect (meaning this test can be used) for the duration of the COVID-19 declaration  under Section 564(b)(1) of the Act, 21 U.S.C. section 360bbb-3(b)(1), unless the authorization is terminated or revoked.     Resp Syncytial Virus by PCR NEGATIVE NEGATIVE Final    Comment: (NOTE) Fact Sheet for Patients: BloggerCourse.com  Fact Sheet for Healthcare Providers: SeriousBroker.it  This test is not yet approved or cleared by the [place] FDA and has been authorized for detection and/or diagnosis  of SARS-CoV-2 by FDA under an Emergency Use Authorization (EUA). This EUA will remain in effect (meaning this test can be used) for the duration of the COVID-19 declaration under Section 564(b)(1) of the Act, 21 U.S.C. section 360bbb-3(b)(1), unless the authorization is terminated or revoked.  Performed at [facility], [address], [place], [place] [address]       Radiology Studies: DG Chest Portable 1 View Result Date: 04/16/2023 CLINICAL DATA:  SOB EXAM: PORTABLE CHEST 1 VIEW COMPARISON:  Chest x-ray 08/17/2021 FINDINGS: Prominent cardiac silhouette likely due to AP portable technique. The heart and mediastinal contours are unchanged. No focal consolidation. No pulmonary edema. No pleural effusion. No pneumothorax. No acute osseous abnormality. IMPRESSION: No active disease. Electronically Signed   By: Tish Frederickson M.D.   On: 04/16/2023 21:37      Scheduled Meds:  docusate sodium  100 mg Oral Daily   enoxaparin (LOVENOX) injection  40 mg Subcutaneous Daily   ipratropium-albuterol  3 mL Nebulization Q6H   methylPREDNISolone (SOLU-MEDROL) injection  80 mg Intravenous Daily   mometasone-formoterol  2 puff Inhalation BID   prenatal vitamin w/FE, FA  1 tablet Oral Q1200   sodium chloride flush  3-10 mL Intravenous Q12H   Continuous Infusions:   LOS: 1 day   Time spent: 25 minutes   Noralee Stain, DO [facility] 04/18/2023, 10:40 AM   Available via Epic secure chat 7am-7pm After these hours, please refer to coverage provider listed on [email or web address]

## 2023-04-19 ENCOUNTER — Encounter (HOSPITAL_COMMUNITY): Payer: Self-pay | Admitting: Family Medicine

## 2023-04-19 DIAGNOSIS — J45901 Unspecified asthma with (acute) exacerbation: Secondary | ICD-10-CM | POA: Diagnosis not present

## 2023-04-19 LAB — BASIC METABOLIC PANEL
Anion gap: 14 (ref 5–15)
BUN: 6 mg/dL (ref 6–20)
CO2: 21 mmol/L — ABNORMAL LOW (ref 22–32)
Calcium: 9.6 mg/dL (ref 8.9–10.3)
Chloride: 103 mmol/L (ref 98–111)
Creatinine, Ser: 0.72 mg/dL (ref 0.44–1.00)
GFR, Estimated: >60 mL/min (ref >60)
Glucose, Bld: 111 mg/dL — ABNORMAL HIGH (ref 70–99)
Potassium: 3.7 mmol/L (ref 3.5–5.1)
Sodium: 138 mmol/L (ref 135–145)

## 2023-04-19 LAB — CBC
HCT: 32.5 % — ABNORMAL LOW (ref 36.0–46.0)
Hemoglobin: 10.9 g/dL — ABNORMAL LOW (ref 12.0–15.0)
MCH: 31.3 pg (ref 26.0–34.0)
MCHC: 33.5 g/dL (ref 30.0–36.0)
MCV: 93.4 fL (ref 80.0–100.0)
Platelets: 209 10*3/uL (ref 150–400)
RBC: 3.48 MIL/uL — ABNORMAL LOW (ref 3.87–5.11)
RDW: 13.7 % (ref 11.5–15.5)
WBC: 14.5 10*3/uL — ABNORMAL HIGH (ref 4.0–10.5)
nRBC: 0 % (ref 0.0–0.2)

## 2023-04-19 MED ORDER — MONTELUKAST SODIUM 10 MG PO TABS
10.0000 mg | ORAL_TABLET | Freq: Every day | ORAL | Status: DC
  Administered 2023-04-19: 10 mg via ORAL
  Filled 2023-04-19: qty 1

## 2023-04-19 MED ORDER — PREDNISONE 20 MG PO TABS
50.0000 mg | ORAL_TABLET | Freq: Every day | ORAL | Status: DC
  Administered 2023-04-20: 50 mg via ORAL
  Filled 2023-04-19: qty 3

## 2023-04-19 NOTE — Progress Notes (Addendum)
 PROGRESS NOTE    Misty Sanders  JWJ:[id number] DOB: 1995/04/16 DOA: 04/16/2023 PCP: Storm Frisk, MD     Brief Narrative:  Misty Sanders is a 28yo female G1P0000, [redacted] weeks pregnant with medical history of asthma presented with scratchy throat, shortness of breath with wheezing.  Patient reported symptoms began on 04/13/2023 when she had a cough, wheezing with shortness of breath, was evaluated at [facility] on [facility], treated with systemic steroids, bronchodilators, improved and was discharged with albuterol prescription.  Unfortunately shortness of breath worsened, EMS was called.  Chest x-ray is negative for acute findings.  COVID, RSV, and influenza PCR are negative.  Patient has been admitted for asthma exacerbation.  New events last 24 hours / Subjective: Feeling a lot better, still with some upper respiratory wheezes and stridor on ambulation   Assessment & Plan:   Principal Problem:   Acute asthma exacerbation Active Problems:   [redacted] weeks gestation of pregnancy   Hypokalemia   Acute asthma exacerbation -COVID, influenza, RSV negative -Continue Solu-Medrol, nebs, Dulera -Remains on room air -Starting singulair   28-weeks gestation of pregnancy -OB service following  Hypokalemia -Resolved   Leukocytosis -In setting of steroid use    DVT prophylaxis:  enoxaparin (LOVENOX) injection 40 mg Start: 04/17/23 1000 SCDs Start: 04/17/23 0224  Code Status: Full code Family Communication: None at bedside Disposition Plan: Home Status is: Inpatient Remains inpatient appropriate because: IV steroids. Hopeful discharge home 2/23 morning    Antimicrobials:  Anti-infectives (From admission, onward)    None        Objective: Vitals:   04/19/23 0014 04/19/23 0256 04/19/23 0840 04/19/23 0845  BP: 116/78  118/72   Pulse: 82  78   Resp: 20  16   Temp: 98.3 F (36.8 C)  98.4 F (36.9 C)   TempSrc: Oral  Oral   SpO2: 100% 100% 99% 98%   No intake or output data  in the 24 hours ending 04/19/23 1039 There were no vitals filed for this visit.  Examination:  General exam: Appears calm and comfortable  Respiratory system: CTAB, on room air, some stridors but appears without distress  Cardiovascular system: S1 & S2 heard, RRR.  Gastrointestinal system: Gravid abdomen Central nervous system: Alert and oriented.  Extremities: Symmetric in appearance  Skin: No rashes, lesions or ulcers on exposed skin    Data Reviewed: I have personally reviewed following labs and imaging studies  CBC: Recent Labs  Lab 04/16/23 2200 04/17/23 0523 04/18/23 0910 04/19/23 0425  WBC 16.2* 15.3* 14.7* 14.5*  NEUTROABS 13.1*  --   --   --   HGB 11.6* 10.8* 9.8* 10.9*  HCT 34.0* 30.2* 28.6* 32.5*  MCV 94.2 92.6 93.2 93.4  PLT 203 191 179 209   Basic Metabolic Panel: Recent Labs  Lab 04/16/23 2200 04/17/23 0523 04/18/23 0910 04/19/23 0425  NA 137 133* 137 138  K 2.8* 3.5 3.4* 3.7  CL 103 105 110 103  CO2 18* 17* 20* 21*  GLUCOSE 122* 144* 82 111*  BUN 6 5* <5* 6  CREATININE 0.77 0.75 0.66 0.72  CALCIUM 9.2 8.9 8.5* 9.6   GFR: Estimated Creatinine Clearance: 121.2 mL/min (by C-G formula based on SCr of 0.72 mg/dL). Liver Function Tests: No results for input(s): "AST", "ALT", "ALKPHOS", "BILITOT", "PROT", "ALBUMIN" in the last 168 hours. No results for input(s): "LIPASE", "AMYLASE" in the last 168 hours. No results for input(s): "AMMONIA" in the last 168 hours. Coagulation Profile: No results for  input(s): "INR", "PROTIME" in the last 168 hours. Cardiac Enzymes: No results for input(s): "CKTOTAL", "CKMB", "CKMBINDEX", "TROPONINI" in the last 168 hours. BNP (last 3 results) No results for input(s): "PROBNP" in the last 8760 hours. HbA1C: No results for input(s): "HGBA1C" in the last 72 hours. CBG: No results for input(s): "GLUCAP" in the last 168 hours. Lipid Profile: No results for input(s): "CHOL", "HDL", "LDLCALC", "TRIG", "CHOLHDL", "LDLDIRECT"  in the last 72 hours. Thyroid Function Tests: No results for input(s): "TSH", "T4TOTAL", "FREET4", "T3FREE", "THYROIDAB" in the last 72 hours. Anemia Panel: No results for input(s): "VITAMINB12", "FOLATE", "FERRITIN", "TIBC", "IRON", "RETICCTPCT" in the last 72 hours. Sepsis Labs: No results for input(s): "PROCALCITON", "LATICACIDVEN" in the last 168 hours.  Recent Results (from the past 240 hours)  Resp panel by RT-PCR (RSV, Flu A&B, Covid) Anterior Nasal Swab     Status: None   Collection Time: 04/15/23  7:41 PM   Specimen: Anterior Nasal Swab  Result Value Ref Range Status   SARS Coronavirus 2 by RT PCR NEGATIVE NEGATIVE Final   Influenza A by PCR NEGATIVE NEGATIVE Final   Influenza B by PCR NEGATIVE NEGATIVE Final    Comment: (NOTE) The Xpert Xpress SARS-CoV-2/FLU/RSV plus assay is intended as an aid in the diagnosis of influenza from Nasopharyngeal swab specimens and should not be used as a sole basis for treatment. Nasal washings and aspirates are unacceptable for Xpert Xpress SARS-CoV-2/FLU/RSV testing.  Fact Sheet for Patients: BloggerCourse.com  Fact Sheet for Healthcare Providers: SeriousBroker.it  This test is not yet approved or cleared by the [place] FDA and has been authorized for detection and/or diagnosis of SARS-CoV-2 by FDA under an Emergency Use Authorization (EUA). This EUA will remain in effect (meaning this test can be used) for the duration of the COVID-19 declaration under Section 564(b)(1) of the Act, 21 U.S.C. section 360bbb-3(b)(1), unless the authorization is terminated or revoked.     Resp Syncytial Virus by PCR NEGATIVE NEGATIVE Final    Comment: (NOTE) Fact Sheet for Patients: BloggerCourse.com  Fact Sheet for Healthcare Providers: SeriousBroker.it  This test is not yet approved or cleared by the [place] FDA and has been authorized  for detection and/or diagnosis of SARS-CoV-2 by FDA under an Emergency Use Authorization (EUA). This EUA will remain in effect (meaning this test can be used) for the duration of the COVID-19 declaration under Section 564(b)(1) of the Act, 21 U.S.C. section 360bbb-3(b)(1), unless the authorization is terminated or revoked.  Performed at [facility], [address], [place], [place] [address]   Resp panel by RT-PCR (RSV, Flu A&B, Covid) Anterior Nasal Swab     Status: None   Collection Time: 04/16/23  9:25 PM   Specimen: Anterior Nasal Swab  Result Value Ref Range Status   SARS Coronavirus 2 by RT PCR NEGATIVE NEGATIVE Final   Influenza A by PCR NEGATIVE NEGATIVE Final   Influenza B by PCR NEGATIVE NEGATIVE Final    Comment: (NOTE) The Xpert Xpress SARS-CoV-2/FLU/RSV plus assay is intended as an aid in the diagnosis of influenza from Nasopharyngeal swab specimens and should not be used as a sole basis for treatment. Nasal washings and aspirates are unacceptable for Xpert Xpress SARS-CoV-2/FLU/RSV testing.  Fact Sheet for Patients: BloggerCourse.com  Fact Sheet for Healthcare Providers: SeriousBroker.it  This test is not yet approved or cleared by the [place] FDA and has been authorized for detection and/or diagnosis of SARS-CoV-2 by FDA under an Emergency Use Authorization (EUA). This  EUA will remain in effect (meaning this test can be used) for the duration of the COVID-19 declaration under Section 564(b)(1) of the Act, 21 U.S.C. section 360bbb-3(b)(1), unless the authorization is terminated or revoked.     Resp Syncytial Virus by PCR NEGATIVE NEGATIVE Final    Comment: (NOTE) Fact Sheet for Patients: BloggerCourse.com  Fact Sheet for Healthcare Providers: SeriousBroker.it  This test is not yet approved or cleared by the [place] FDA and has been  authorized for detection and/or diagnosis of SARS-CoV-2 by FDA under an Emergency Use Authorization (EUA). This EUA will remain in effect (meaning this test can be used) for the duration of the COVID-19 declaration under Section 564(b)(1) of the Act, 21 U.S.C. section 360bbb-3(b)(1), unless the authorization is terminated or revoked.  Performed at [facility], [address], [place], [place] [address]       Radiology Studies: No results found.     Scheduled Meds:  docusate sodium  100 mg Oral Daily   enoxaparin (LOVENOX) injection  40 mg Subcutaneous Daily   ipratropium-albuterol  3 mL Nebulization Q6H   methylPREDNISolone (SOLU-MEDROL) injection  80 mg Intravenous Daily   mometasone-formoterol  2 puff Inhalation BID   prenatal vitamin w/FE, FA  1 tablet Oral Q1200   sodium chloride flush  3-10 mL Intravenous Q12H   Continuous Infusions:   LOS: 2 days   Time spent: 25 minutes   Noralee Stain, DO [facility] 04/19/2023, 10:39 AM   Available via Epic secure chat 7am-7pm After these hours, please refer to coverage provider listed on [email or web address]

## 2023-04-19 NOTE — Plan of Care (Signed)

## 2023-04-19 NOTE — Progress Notes (Signed)
 Arrived in pt's room  to deliver [facility] tx but patient requested that I return in 45 minutes because she had just eaten. Plan to return for HHNtx.

## 2023-04-19 NOTE — Progress Notes (Addendum)
 FACULTY PRACTICE OBSTETRICS CONSULT PROGRESS NOTE  Misty Sanders is a 28 y.o. G1P0000 at [redacted]w[redacted]d who is admitted for asthma exacerbation.  Estimated Date of Delivery: 07/08/23 Fetal presentation is unsure.  Length of Stay:  2 Days. Admitted 04/16/2023  Subjective: No OB s/s (LOF, VB, decreased fetal movement or contractions) and no cardiac or respiratory s/s.   Vitals:  Blood pressure 116/78, pulse 82, temperature 98.3 F (36.8 C), temperature source Oral, resp. rate 20, last menstrual period 10/01/2022, SpO2 100%. Physical Examination: CONSTITUTIONAL: Well-developed, well-nourished female in no acute distress.  CARDIOVASCULAR: Normal heart rate noted, regular rhythm RESPIRATORY: Effort normal, bilateral expiratory wheezes noted with occasional light rhonci ABDOMEN: Soft, nontender, nondistended, gravid. SKIN: Skin is warm and dry  NEUROLGIC: grossly normal PSYCHIATRIC: Normal mood and affect. Normal behavior. Normal judgment and thought content. CERVIX: deferred  Fetal monitoring: 135 baseline, +accels, no decel, mod variability, tocometry: negative  Results for orders placed or performed during the hospital encounter of 04/16/23 (from the past 48 hours)  CBC     Status: Abnormal   Collection Time: 04/18/23  9:10 AM  Result Value Ref Range   WBC 14.7 (H) 4.0 - 10.5 K/uL   RBC 3.07 (L) 3.87 - 5.11 MIL/uL   Hemoglobin 9.8 (L) 12.0 - 15.0 g/dL   HCT 16.1 (L) 09.6 - 04.5 %   MCV 93.2 80.0 - 100.0 fL   MCH 31.9 26.0 - 34.0 pg   MCHC 34.3 30.0 - 36.0 g/dL   RDW 40.9 81.1 - 91.4 %   Platelets 179 150 - 400 K/uL   nRBC 0.0 0.0 - 0.2 %    Comment: Performed at [facility], [address] [place], [place] [address]  Basic metabolic panel     Status: Abnormal   Collection Time: 04/18/23  9:10 AM  Result Value Ref Range   Sodium 137 135 - 145 mmol/L   Potassium 3.4 (L) 3.5 - 5.1 mmol/L   Chloride 110 98 - 111 mmol/L   CO2 20 (L) 22 - 32 mmol/L   Glucose, Bld 82 70 - 99  mg/dL    Comment: Glucose reference range applies only to samples taken after fasting for at least 8 hours.   BUN <5 (L) 6 - 20 mg/dL   Creatinine, Ser 6.21 0.44 - 1.00 mg/dL   Calcium 8.5 (L) 8.9 - 10.3 mg/dL   GFR, Estimated >30 >86 mL/min    Comment: (NOTE) Calculated using the CKD-EPI Creatinine Equation (2021)    Anion gap 7 5 - 15    Comment: Performed at [facility], [address], [place], [place] [address]  CBC     Status: Abnormal   Collection Time: 04/19/23  4:25 AM  Result Value Ref Range   WBC 14.5 (H) 4.0 - 10.5 K/uL   RBC 3.48 (L) 3.87 - 5.11 MIL/uL   Hemoglobin 10.9 (L) 12.0 - 15.0 g/dL   HCT 96.2 (L) 95.2 - 84.1 %   MCV 93.4 80.0 - 100.0 fL   MCH 31.3 26.0 - 34.0 pg   MCHC 33.5 30.0 - 36.0 g/dL   RDW 32.4 40.1 - 02.7 %   Platelets 209 150 - 400 K/uL   nRBC 0.0 0.0 - 0.2 %    Comment: Performed at [facility], [address] [place], [place] [address]  Basic metabolic panel     Status: Abnormal   Collection Time: 04/19/23  4:25 AM  Result Value Ref Range  Sodium 138 135 - 145 mmol/L   Potassium 3.7 3.5 - 5.1 mmol/L   Chloride 103 98 - 111 mmol/L   CO2 21 (L) 22 - 32 mmol/L   Glucose, Bld 111 (H) 70 - 99 mg/dL    Comment: Glucose reference range applies only to samples taken after fasting for at least 8 hours.   BUN 6 6 - 20 mg/dL   Creatinine, Ser 1.61 0.44 - 1.00 mg/dL   Calcium 9.6 8.9 - 09.6 mg/dL   GFR, Estimated >04 >54 mL/min    Comment: (NOTE) Calculated using the CKD-EPI Creatinine Equation (2021)    Anion gap 14 5 - 15    Comment: Performed at [facility], [address], [place], [place] [address]    I have reviewed the patient's current medications. Current Facility-Administered Medications  Medication Dose Route Frequency Provider Last Rate Last Admin   acetaminophen (TYLENOL) tablet 650 mg  650 mg Oral Q6H PRN Opyd, Lavone Neri, MD   650 mg at 04/17/23 2311   Or   acetaminophen (TYLENOL) suppository 650 mg  650 mg  Rectal Q6H PRN Opyd, Lavone Neri, MD       albuterol (PROVENTIL) (2.5 MG/3ML) 0.083% nebulizer solution 2.5 mg  2.5 mg Nebulization Q2H PRN Opyd, Lavone Neri, MD   2.5 mg at 04/17/23 2143   calcium carbonate (TUMS - dosed in mg elemental calcium) chewable tablet 400 mg of elemental calcium  2 tablet Oral Q4H PRN Warden Fillers, MD   400 mg of elemental calcium at 04/19/23 0409   dextromethorphan-guaiFENesin (MUCINEX DM) 30-600 MG per 12 hr tablet 1 tablet  1 tablet Oral BID PRN Gery Pray, MD   1 tablet at 04/18/23 0131   docusate sodium (COLACE) capsule 100 mg  100 mg Oral Daily Warden Fillers, MD       enoxaparin (LOVENOX) injection 40 mg  40 mg Subcutaneous Daily Opyd, Lavone Neri, MD       ipratropium-albuterol (DUONEB) 0.5-2.5 (3) MG/3ML nebulizer solution 3 mL  3 mL Nebulization Q6H Rai, Ripudeep K, MD   3 mL at 04/19/23 0255   menthol-cetylpyridinium (CEPACOL) lozenge 3 mg  1 lozenge Oral PRN Warden Fillers, MD       methylPREDNISolone sodium succinate (SOLU-MEDROL) 125 mg/2 mL injection 80 mg  80 mg Intravenous Daily Opyd, Lavone Neri, MD   80 mg at 04/18/23 1039   mometasone-formoterol (DULERA) 100-5 MCG/ACT inhaler 2 puff  2 puff Inhalation BID Rai, Ripudeep K, MD   2 puff at 04/18/23 2016   ondansetron (ZOFRAN) tablet 4 mg  4 mg Oral Q6H PRN Opyd, Lavone Neri, MD       Or   ondansetron (ZOFRAN) injection 4 mg  4 mg Intravenous Q6H PRN Opyd, Lavone Neri, MD       prenatal vitamin w/FE, FA (NATACHEW) chewable tablet 1 tablet  1 tablet Oral Q1200 Opyd, Lavone Neri, MD   1 tablet at 04/18/23 1202   sodium chloride flush (NS) 0.9 % injection 3-10 mL  3-10 mL Intravenous Q12H Warden Fillers, MD   10 mL at 04/18/23 2021   sodium chloride flush (NS) 0.9 % injection 3-10 mL  3-10 mL Intravenous PRN Warden Fillers, MD       ASSESSMENT: Principal Problem:   Acute asthma exacerbation Active Problems:   [redacted] weeks gestation of pregnancy   Hypokalemia Patient doing well  PLAN: Maternal fetal  status reassuring with reactive NST; continue with daily monitoring.OB  will continue to follow. Patient states tums is working for her GERD. Patient endorses seasonal/pollen/dander allergies; recommend starting singulair -negative covid, rsv, flu, CXR on admit  Cornelia Copa, MD Attending Center for [facility] GYN Consult Phone: [phone] (M-F, [phone]) & [phone] (Off hours, weekends, holidays) 04/19/2023 7:22 AM

## 2023-04-20 DIAGNOSIS — J45901 Unspecified asthma with (acute) exacerbation: Secondary | ICD-10-CM | POA: Diagnosis not present

## 2023-04-20 MED ORDER — MOMETASONE FURO-FORMOTEROL FUM 100-5 MCG/ACT IN AERO: 2.0000 | INHALATION_SPRAY | Freq: Two times a day (BID) | RESPIRATORY_TRACT | 1 refills | Status: DC

## 2023-04-20 MED ORDER — ALBUTEROL SULFATE HFA 108 (90 BASE) MCG/ACT IN AERS: 2.0000 | INHALATION_SPRAY | Freq: Four times a day (QID) | RESPIRATORY_TRACT | 2 refills | Status: DC | PRN

## 2023-04-20 MED ORDER — MONTELUKAST SODIUM 10 MG PO TABS: 10.0000 mg | ORAL_TABLET | Freq: Every day | ORAL | 0 refills | Status: AC

## 2023-04-20 NOTE — Discharge Summary (Signed)
 Physician Discharge Summary  Misty Sanders [facility] YQM:[id number] DOB: [birth year] DOA: 04/16/2023  PCP: Storm Frisk, MD  Admit date: 04/16/2023 Discharge date: 04/20/2023  Admitted From: Home Disposition:  Home  Recommendations for Outpatient Follow-up:  Follow up with PCP  Discharge Condition: Stabl CODE STATUS: Full  Diet recommendation: Regular   Brief/Interim Summary: Misty Sanders is a 28yo female G1P0000, [redacted] weeks pregnant with medical history of asthma presented with scratchy throat, shortness of breath with wheezing.  Patient reported symptoms began on 04/13/2023 when she had a cough, wheezing with shortness of breath, was evaluated at [facility] on 2/18, treated with systemic steroids, bronchodilators, improved and was discharged with albuterol prescription.  Unfortunately shortness of breath worsened, EMS was called.  Chest x-ray is negative for acute findings.  COVID, RSV, and influenza PCR are negative.  Patient has been admitted for asthma exacerbation. She was treated with nebulizer treatments, solumedrol, dulera, singulair and had improvement in symptoms. Patient and fetus were monitored by [facility] team during hospitalization.   Discharge Diagnoses:   Principal Problem:   Acute asthma exacerbation Active Problems:   [redacted] weeks gestation of pregnancy   Hypokalemia   Discharge Instructions  Discharge Instructions     Call MD for:  difficulty breathing, headache or visual disturbances   Complete by: As directed    Call MD for:  extreme fatigue   Complete by: As directed    Call MD for:  persistant dizziness or light-headedness   Complete by: As directed    Call MD for:  persistant nausea and vomiting   Complete by: As directed    Call MD for:  severe uncontrolled pain   Complete by: As directed    Call MD for:  temperature >100.4   Complete by: As directed    Diet general   Complete by: As directed    Discharge instructions   Complete by: As directed    You were cared  for by a hospitalist during your hospital stay. If you have any questions about your discharge medications or the care you received while you were in the hospital after you are discharged, you can call the unit and ask to speak with the hospitalist on call if the hospitalist that took care of you is not available. Once you are discharged, your primary care physician will handle any further medical issues. Please note that NO REFILLS for any discharge medications will be authorized once you are discharged, as it is imperative that you return to your primary care physician (or establish a relationship with a primary care physician if you do not have one) for your aftercare needs so that they can reassess your need for medications and monitor your lab values.   Increase activity slowly   Complete by: As directed       Allergies as of 04/20/2023       Reactions   Fish Allergy Anaphylaxis, Other (See Comments)   Face swelling, throat swells, itching   Penicillins Anaphylaxis, Other (See Comments)   Has patient had a PCN reaction causing immediate rash, facial/tongue/throat swelling, SOB or lightheadedness with hypotension: Yes Has patient had a PCN reaction causing severe rash involving mucus membranes or skin necrosis: No Has patient had a PCN reaction that required hospitalization: No Has patient had a PCN reaction occurring within the last 10 years: No If all of the above answers are "NO", then may proceed with Cephalosporin use.   Egg-derived Products Itching, Swelling   Peanuts [peanut Oil]  Hives   Sulfa Antibiotics Hives   Latex Rash        Medication List     TAKE these medications    albuterol 108 (90 Base) MCG/ACT inhaler Commonly known as: VENTOLIN HFA Inhale 2 puffs into the lungs every 6 (six) hours as needed for wheezing or shortness of breath.   mometasone-formoterol 100-5 MCG/ACT Aero Commonly known as: DULERA Inhale 2 puffs into the lungs 2 (two) times daily.    montelukast 10 MG tablet Commonly known as: SINGULAIR Take 1 tablet (10 mg total) by mouth at bedtime.   prenatal vitamin w/FE, FA 29-1 MG Chew chewable tablet Chew 1 tablet by mouth daily at 12 noon.        Follow-up Information     Storm Frisk, MD Follow up.   Specialty: Pulmonary Disease Contact information: [address] [place] [address] [phone]                Allergies  Allergen Reactions   Fish Allergy Anaphylaxis and Other (See Comments)    Face swelling, throat swells, itching   Penicillins Anaphylaxis and Other (See Comments)    Has patient had a PCN reaction causing immediate rash, facial/tongue/throat swelling, SOB or lightheadedness with hypotension: Yes Has patient had a PCN reaction causing severe rash involving mucus membranes or skin necrosis: No Has patient had a PCN reaction that required hospitalization: No Has patient had a PCN reaction occurring within the last 10 years: No If all of the above answers are "NO", then may proceed with Cephalosporin use.   Egg-Derived Products Itching and Swelling   Peanuts [Peanut Oil] Hives   Sulfa Antibiotics Hives   Latex Rash     Procedures/Studies: DG Chest Portable 1 View Result Date: 04/16/2023 CLINICAL DATA:  SOB EXAM: PORTABLE CHEST 1 VIEW COMPARISON:  Chest x-ray 08/17/2021 FINDINGS: Prominent cardiac silhouette likely due to AP portable technique. The heart and mediastinal contours are unchanged. No focal consolidation. No pulmonary edema. No pleural effusion. No pneumothorax. No acute osseous abnormality. IMPRESSION: No active disease. Electronically Signed   By: Tish Frederickson M.D.   On: 04/16/2023 21:37      Discharge Exam: Vitals:   04/19/23 2338 04/20/23 0727  BP: (!) 106/59 111/64  Pulse: 85 66  Resp: 20 18  Temp: 98.6 F (37 C) (!) 97.5 F (36.4 C)  SpO2: 99% 99%    General: Pt is alert, awake, not in acute distress Cardiovascular: RRR, S1/S2 +, no  edema Respiratory: CTA bilaterally, no wheezing, no rhonchi, no respiratory distress, no conversational dyspnea, on room air  Abdominal: Soft, NT, ND, bowel sounds + Extremities: no edema, no cyanosis Psych: Normal mood and affect, stable judgement and insight     The results of significant diagnostics from this hospitalization (including imaging, microbiology, ancillary and laboratory) are listed below for reference.     Microbiology: Recent Results (from the past 240 hours)  Resp panel by RT-PCR (RSV, Flu A&B, Covid) Anterior Nasal Swab     Status: None   Collection Time: 04/15/23  7:41 PM   Specimen: Anterior Nasal Swab  Result Value Ref Range Status   SARS Coronavirus 2 by RT PCR NEGATIVE NEGATIVE Final   Influenza A by PCR NEGATIVE NEGATIVE Final   Influenza B by PCR NEGATIVE NEGATIVE Final    Comment: (NOTE) The Xpert Xpress SARS-CoV-2/FLU/RSV plus assay is intended as an aid in the diagnosis of influenza from Nasopharyngeal swab specimens and should not  be used as a sole basis for treatment. Nasal washings and aspirates are unacceptable for Xpert Xpress SARS-CoV-2/FLU/RSV testing.  Fact Sheet for Patients: BloggerCourse.com  Fact Sheet for Healthcare Providers: SeriousBroker.it  This test is not yet approved or cleared by the [place] FDA and has been authorized for detection and/or diagnosis of SARS-CoV-2 by FDA under an Emergency Use Authorization (EUA). This EUA will remain in effect (meaning this test can be used) for the duration of the COVID-19 declaration under Section 564(b)(1) of the Act, 21 U.S.C. section 360bbb-3(b)(1), unless the authorization is terminated or revoked.     Resp Syncytial Virus by PCR NEGATIVE NEGATIVE Final    Comment: (NOTE) Fact Sheet for Patients: BloggerCourse.com  Fact Sheet for Healthcare Providers: SeriousBroker.it  This  test is not yet approved or cleared by the [place] FDA and has been authorized for detection and/or diagnosis of SARS-CoV-2 by FDA under an Emergency Use Authorization (EUA). This EUA will remain in effect (meaning this test can be used) for the duration of the COVID-19 declaration under Section 564(b)(1) of the Act, 21 U.S.C. section 360bbb-3(b)(1), unless the authorization is terminated or revoked.  Performed at [facility], [address], [place], [place] [address]   Resp panel by RT-PCR (RSV, Flu A&B, Covid) Anterior Nasal Swab     Status: None   Collection Time: 04/16/23  9:25 PM   Specimen: Anterior Nasal Swab  Result Value Ref Range Status   SARS Coronavirus 2 by RT PCR NEGATIVE NEGATIVE Final   Influenza A by PCR NEGATIVE NEGATIVE Final   Influenza B by PCR NEGATIVE NEGATIVE Final    Comment: (NOTE) The Xpert Xpress SARS-CoV-2/FLU/RSV plus assay is intended as an aid in the diagnosis of influenza from Nasopharyngeal swab specimens and should not be used as a sole basis for treatment. Nasal washings and aspirates are unacceptable for Xpert Xpress SARS-CoV-2/FLU/RSV testing.  Fact Sheet for Patients: BloggerCourse.com  Fact Sheet for Healthcare Providers: SeriousBroker.it  This test is not yet approved or cleared by the [place] FDA and has been authorized for detection and/or diagnosis of SARS-CoV-2 by FDA under an Emergency Use Authorization (EUA). This EUA will remain in effect (meaning this test can be used) for the duration of the COVID-19 declaration under Section 564(b)(1) of the Act, 21 U.S.C. section 360bbb-3(b)(1), unless the authorization is terminated or revoked.     Resp Syncytial Virus by PCR NEGATIVE NEGATIVE Final    Comment: (NOTE) Fact Sheet for Patients: BloggerCourse.com  Fact Sheet for Healthcare  Providers: SeriousBroker.it  This test is not yet approved or cleared by the [place] FDA and has been authorized for detection and/or diagnosis of SARS-CoV-2 by FDA under an Emergency Use Authorization (EUA). This EUA will remain in effect (meaning this test can be used) for the duration of the COVID-19 declaration under Section 564(b)(1) of the Act, 21 U.S.C. section 360bbb-3(b)(1), unless the authorization is terminated or revoked.  Performed at [facility], [address] [place], [place] [address]      Labs: BNP (last 3 results) No results for input(s): "BNP" in the last 8760 hours. Basic Metabolic Panel: Recent Labs  Lab 04/16/23 2200 04/17/23 0523 04/18/23 0910 04/19/23 0425  NA 137 133* 137 138  K 2.8* 3.5 3.4* 3.7  CL 103 105 110 103  CO2 18* 17* 20* 21*  GLUCOSE 122* 144* 82 111*  BUN 6 5* <5* 6  CREATININE 0.77 0.75 0.66 0.72  CALCIUM 9.2  8.9 8.5* 9.6   Liver Function Tests: No results for input(s): "AST", "ALT", "ALKPHOS", "BILITOT", "PROT", "ALBUMIN" in the last 168 hours. No results for input(s): "LIPASE", "AMYLASE" in the last 168 hours. No results for input(s): "AMMONIA" in the last 168 hours. CBC: Recent Labs  Lab 04/16/23 2200 04/17/23 0523 04/18/23 0910 04/19/23 0425  WBC 16.2* 15.3* 14.7* 14.5*  NEUTROABS 13.1*  --   --   --   HGB 11.6* 10.8* 9.8* 10.9*  HCT 34.0* 30.2* 28.6* 32.5*  MCV 94.2 92.6 93.2 93.4  PLT 203 191 179 209   Cardiac Enzymes: No results for input(s): "CKTOTAL", "CKMB", "CKMBINDEX", "TROPONINI" in the last 168 hours. BNP: Invalid input(s): "POCBNP" CBG: No results for input(s): "GLUCAP" in the last 168 hours. D-Dimer No results for input(s): "DDIMER" in the last 72 hours. Hgb A1c No results for input(s): "HGBA1C" in the last 72 hours. Lipid Profile No results for input(s): "CHOL", "HDL", "LDLCALC", "TRIG", "CHOLHDL", "LDLDIRECT" in the last 72 hours. Thyroid function  studies No results for input(s): "TSH", "T4TOTAL", "T3FREE", "THYROIDAB" in the last 72 hours.  Invalid input(s): "FREET3" Anemia work up No results for input(s): "VITAMINB12", "FOLATE", "FERRITIN", "TIBC", "IRON", "RETICCTPCT" in the last 72 hours. Urinalysis    Component Value Date/Time   COLORURINE YELLOW 04/15/2023 1940   APPEARANCEUR HAZY (A) 04/15/2023 1940   LABSPEC 1.009 04/15/2023 1940   PHURINE 6.0 04/15/2023 1940   GLUCOSEU NEGATIVE 04/15/2023 1940   HGBUR NEGATIVE 04/15/2023 1940   BILIRUBINUR NEGATIVE 04/15/2023 1940   BILIRUBINUR negative 06/11/2022 1033   KETONESUR NEGATIVE 04/15/2023 1940   PROTEINUR NEGATIVE 04/15/2023 1940   UROBILINOGEN 0.2 06/11/2022 1033   UROBILINOGEN 0.2 05/18/2013 1219   NITRITE NEGATIVE 04/15/2023 1940   LEUKOCYTESUR TRACE (A) 04/15/2023 1940   Sepsis Labs Recent Labs  Lab 04/16/23 2200 04/17/23 0523 04/18/23 0910 04/19/23 0425  WBC 16.2* 15.3* 14.7* 14.5*   Microbiology Recent Results (from the past 240 hours)  Resp panel by RT-PCR (RSV, Flu A&B, Covid) Anterior Nasal Swab     Status: None   Collection Time: 04/15/23  7:41 PM   Specimen: Anterior Nasal Swab  Result Value Ref Range Status   SARS Coronavirus 2 by RT PCR NEGATIVE NEGATIVE Final   Influenza A by PCR NEGATIVE NEGATIVE Final   Influenza B by PCR NEGATIVE NEGATIVE Final    Comment: (NOTE) The Xpert Xpress SARS-CoV-2/FLU/RSV plus assay is intended as an aid in the diagnosis of influenza from Nasopharyngeal swab specimens and should not be used as a sole basis for treatment. Nasal washings and aspirates are unacceptable for Xpert Xpress SARS-CoV-2/FLU/RSV testing.  Fact Sheet for Patients: BloggerCourse.com  Fact Sheet for Healthcare Providers: SeriousBroker.it  This test is not yet approved or cleared by the [place] FDA and has been authorized for detection and/or diagnosis of SARS-CoV-2 by FDA under an  Emergency Use Authorization (EUA). This EUA will remain in effect (meaning this test can be used) for the duration of the COVID-19 declaration under Section 564(b)(1) of the Act, 21 U.S.C. section 360bbb-3(b)(1), unless the authorization is terminated or revoked.     Resp Syncytial Virus by PCR NEGATIVE NEGATIVE Final    Comment: (NOTE) Fact Sheet for Patients: BloggerCourse.com  Fact Sheet for Healthcare Providers: SeriousBroker.it  This test is not yet approved or cleared by the [place] FDA and has been authorized for detection and/or diagnosis of SARS-CoV-2 by FDA under an Emergency Use Authorization (EUA). This EUA will remain in effect (meaning this  test can be used) for the duration of the COVID-19 declaration under Section 564(b)(1) of the Act, 21 U.S.C. section 360bbb-3(b)(1), unless the authorization is terminated or revoked.  Performed at [facility], [address] [place], [place] [address]   Resp panel by RT-PCR (RSV, Flu A&B, Covid) Anterior Nasal Swab     Status: None   Collection Time: 04/16/23  9:25 PM   Specimen: Anterior Nasal Swab  Result Value Ref Range Status   SARS Coronavirus 2 by RT PCR NEGATIVE NEGATIVE Final   Influenza A by PCR NEGATIVE NEGATIVE Final   Influenza B by PCR NEGATIVE NEGATIVE Final    Comment: (NOTE) The Xpert Xpress SARS-CoV-2/FLU/RSV plus assay is intended as an aid in the diagnosis of influenza from Nasopharyngeal swab specimens and should not be used as a sole basis for treatment. Nasal washings and aspirates are unacceptable for Xpert Xpress SARS-CoV-2/FLU/RSV testing.  Fact Sheet for Patients: BloggerCourse.com  Fact Sheet for Healthcare Providers: SeriousBroker.it  This test is not yet approved or cleared by the [place] FDA and has been authorized for detection and/or diagnosis of SARS-CoV-2 by FDA under  an Emergency Use Authorization (EUA). This EUA will remain in effect (meaning this test can be used) for the duration of the COVID-19 declaration under Section 564(b)(1) of the Act, 21 U.S.C. section 360bbb-3(b)(1), unless the authorization is terminated or revoked.     Resp Syncytial Virus by PCR NEGATIVE NEGATIVE Final    Comment: (NOTE) Fact Sheet for Patients: BloggerCourse.com  Fact Sheet for Healthcare Providers: SeriousBroker.it  This test is not yet approved or cleared by the [place] FDA and has been authorized for detection and/or diagnosis of SARS-CoV-2 by FDA under an Emergency Use Authorization (EUA). This EUA will remain in effect (meaning this test can be used) for the duration of the COVID-19 declaration under Section 564(b)(1) of the Act, 21 U.S.C. section 360bbb-3(b)(1), unless the authorization is terminated or revoked.  Performed at [facility], [address], [place], [place] [address]      Patient was seen and examined on the day of discharge and was found to be in stable condition. Time coordinating discharge: 25 minutes including assessment and coordination of care, as well as examination of the patient.   SIGNED:  Noralee Stain, DO [facility] 04/20/2023, 8:40 AM

## 2023-04-21 ENCOUNTER — Telehealth: Payer: Self-pay

## 2023-04-21 NOTE — Transitions of Care (Post Inpatient/ED Visit) (Signed)
   04/21/2023  Name: Misty Sanders MRN: [id number] DOB: 1996-02-25  Today's TOC FU Call Status: Today's TOC FU Call Status:: Successful TOC FU Call Completed TOC FU Call Complete Date: 04/21/23 Patient's Name and Date of Birth confirmed.  Transition Care Management Follow-up Telephone Call Date of Discharge: 04/20/23 Discharge Facility: [facility]) Type of Discharge: Inpatient Admission Primary Inpatient Discharge Diagnosis:: acute asthma exacerbation How have you been since you were released from the hospital?: Better (She stated she is feeling good, just congested) Any questions or concerns?: Yes Patient Questions/Concerns:: She would like to be able to better control her asthma and wanted to see a provider as soon as possible. She requested an appointment today but there were none available and she did not want to go to the [facility].  I was able to schedule her tomorrow with Dr Laural Benes Patient Questions/Concerns Addressed: Other: (scheduled her with Dr Laural Benes tomorrow, 04/22/2023.)  Items Reviewed: Did you receive and understand the discharge instructions provided?: Yes Medications obtained,verified, and reconciled?: Yes (Medications Reviewed) (She has all of her medications and did not have any questions about the med regime) Any new allergies since your discharge?: No Dietary orders reviewed?: Yes Type of Diet Ordered:: regular Do you have support at home?: Yes  Medications Reviewed Today: Medications Reviewed Today     Reviewed by Robyne Peers, RN (Case Manager) on 04/21/23 at 1138  Med List Status: <None>   Medication Order Taking? Sig Documenting Provider Last Dose Status Informant  albuterol (VENTOLIN HFA) 108 (90 Base) MCG/ACT inhaler [id number]  Inhale 2 puffs into the lungs every 6 (six) hours as needed for wheezing or shortness of breath. Noralee Stain, DO  Active   mometasone-formoterol [facility]) 100-5 MCG/ACT Sandrea Matte [id number]  Inhale 2 puffs into the lungs 2 (two)  times daily. Noralee Stain, DO  Active   montelukast (SINGULAIR) 10 MG tablet [id number]  Take 1 tablet (10 mg total) by mouth at bedtime. Noralee Stain, DO  Active   prenatal vitamin w/FE, FA (NATACHEW) 29-1 MG CHEW chewable tablet [id number] No Chew 1 tablet by mouth daily at 12 noon. [provider] 04/16/2023 Noon Active Self, Pharmacy Records            Home Care and Equipment/Supplies: Were Home Health Services Ordered?: No Any new equipment or medical supplies ordered?: No  Functional Questionnaire: Do you need assistance with bathing/showering or dressing?: No Do you need assistance with meal preparation?: No Do you need assistance with eating?: No Do you have difficulty maintaining continence: No Do you need assistance with getting out of bed/getting out of a chair/moving?: No Do you have difficulty managing or taking your medications?: No  Follow up appointments reviewed: PCP Follow-up appointment confirmed?: Yes Date of PCP follow-up appointment?: 04/22/23 Follow-up Provider: Dr Laural Benes - she will need to establish care with a new PCP as Dr Delford Field has retired. Specialist Hospital Follow-up appointment confirmed?: Yes Date of Specialist follow-up appointment?: 04/23/23 Follow-Up Specialty Provider:: OB/GYN Do you need transportation to your follow-up appointment?: No Do you understand care options if your condition(s) worsen?: Yes-patient verbalized understanding    SIGNATURE Robyne Peers, RN

## 2023-04-22 ENCOUNTER — Encounter: Payer: Self-pay | Admitting: Internal Medicine

## 2023-04-22 ENCOUNTER — Ambulatory Visit: Payer: Self-pay | Attending: Internal Medicine | Admitting: Internal Medicine

## 2023-04-22 VITALS — BP 97/62 | HR 85 | Temp 97.9°F | Ht 67.0 in | Wt 195.0 lb

## 2023-04-22 DIAGNOSIS — Z09 Encounter for follow-up examination after completed treatment for conditions other than malignant neoplasm: Secondary | ICD-10-CM | POA: Diagnosis not present

## 2023-04-22 DIAGNOSIS — J4530 Mild persistent asthma, uncomplicated: Secondary | ICD-10-CM

## 2023-04-22 MED ORDER — MOMETASONE FURO-FORMOTEROL FUM 100-5 MCG/ACT IN AERO: 2.0000 | INHALATION_SPRAY | Freq: Two times a day (BID) | RESPIRATORY_TRACT | 6 refills | Status: AC

## 2023-04-22 MED ORDER — ALBUTEROL SULFATE HFA 108 (90 BASE) MCG/ACT IN AERS: 2.0000 | INHALATION_SPRAY | Freq: Four times a day (QID) | RESPIRATORY_TRACT | 12 refills | Status: DC | PRN

## 2023-04-22 NOTE — Progress Notes (Signed)
 Patient ID: Misty Sanders, female    DOB: Dec 19, 1995  MRN: [id number]  CC: TOC Visit Date hosp: 2/19-23/25 Date call CW 04/21/23 Hospitalization Follow-up (Hospitalization f/u. /No questions / concerns /No to Tdap vax)   Subjective: Misty Sanders is a 28 y.o. female who presents for [facility] visit Her concerns today include:  Pt with hx of asthma  Discussed the use of AI scribe software for clinical note transcription with the patient, who gave verbal consent to proceed.  History of Present Illness   The patient, with a history of asthma, presents for a follow-up after a recent hospitalization. She was admitted due to an asthma exacerbation and was tested for COVID, flu, and RSV viruses, all of which were negative. Upon discharge, she was prescribed Dulera, albuterol, and Singulair, but she has not yet picked up these medications as yet. She reports using Dulera daily and Singulair was newly added during the hospitalization. Since the hospitalization, she reports an improvement in her breathing and feels that it is close to her baseline. The patient is also [redacted] weeks pregnant and is under the care of an obstetrician.      Patient Active Problem List   Diagnosis Date Noted   [redacted] weeks gestation of pregnancy 04/17/2023   Hypokalemia 04/17/2023   Acute asthma exacerbation 04/16/2023   Acne 09/03/2022   Lumbar radiculopathy 12/28/2020   Onychomycosis 12/28/2020   Tinea pedis 12/28/2020     Current Outpatient Medications on File Prior to Visit  Medication Sig Dispense Refill   albuterol (VENTOLIN HFA) 108 (90 Base) MCG/ACT inhaler Inhale 2 puffs into the lungs every 6 (six) hours as needed for wheezing or shortness of breath. 8 g 2   mometasone-formoterol (DULERA) 100-5 MCG/ACT AERO Inhale 2 puffs into the lungs 2 (two) times daily. 1 each 1   montelukast (SINGULAIR) 10 MG tablet Take 1 tablet (10 mg total) by mouth at bedtime. 30 tablet 0   prenatal vitamin w/FE, FA (NATACHEW) 29-1  MG CHEW chewable tablet Chew 1 tablet by mouth daily at 12 noon.     No current facility-administered medications on file prior to visit.    Allergies  Allergen Reactions   Fish Allergy Anaphylaxis and Other (See Comments)    Face swelling, throat swells, itching   Penicillins Anaphylaxis and Other (See Comments)    Has patient had a PCN reaction causing immediate rash, facial/tongue/throat swelling, SOB or lightheadedness with hypotension: Yes Has patient had a PCN reaction causing severe rash involving mucus membranes or skin necrosis: No Has patient had a PCN reaction that required hospitalization: No Has patient had a PCN reaction occurring within the last 10 years: No If all of the above answers are "NO", then may proceed with Cephalosporin use.   Egg-Derived Products Itching and Swelling   Peanuts [Peanut Oil] Hives   Sulfa Antibiotics Hives   Latex Rash    Social History   Socioeconomic History   Marital status: Single    Spouse name: Not on file   Number of children: Not on file   Years of education: Not on file   Highest education level: Not on file  Occupational History   Not on file  Tobacco Use   Smoking status: Former    Current packs/day: 0.25    Types: Cigarettes   Smokeless tobacco: Never  Vaping Use   Vaping status: Never Used  Substance and Sexual Activity   Alcohol use: Not Currently   Drug use: Not Currently  Types: Marijuana    Comment: denies any use currently   Sexual activity: Not Currently    Birth control/protection: None  Other Topics Concern   Not on file  Social History Narrative   Not on file   Social Drivers of Health   Financial Resource Strain: Low Risk  (04/22/2023)   Overall Financial Resource Strain (CARDIA)    Difficulty of Paying Living Expenses: Not very hard  Food Insecurity: Food Insecurity Present (04/22/2023)   Hunger Vital Sign    Worried About Running Out of Food in the Last Year: Sometimes true    Ran Out of Food  in the Last Year: Often true  Transportation Needs: No Transportation Needs (04/17/2023)   PRAPARE - Administrator, Civil Service (Medical): No    Lack of Transportation (Non-Medical): No  Physical Activity: Inactive (04/22/2023)   Exercise Vital Sign    Days of Exercise per Week: 0 days    Minutes of Exercise per Session: 0 min  Stress: No Stress Concern Present (04/22/2023)   [employer] of Occupational Health - Occupational Stress Questionnaire    Feeling of Stress : Only a little  Social Connections: Moderately Isolated (04/22/2023)   Social Connection and Isolation Panel [NHANES]    Frequency of Communication with Friends and Family: Once a week    Frequency of Social Gatherings with Friends and Family: Once a week    Attends Religious Services: More than 4 times per year    Active Member of [employer] or Organizations: Yes    Attends Banker Meetings: Never    Marital Status: Never married  Intimate Partner Violence: Not At Risk (04/22/2023)   Humiliation, Afraid, Rape, and Kick questionnaire    Fear of Current or Ex-Partner: No    Emotionally Abused: No    Physically Abused: No    Sexually Abused: No    Family History  Problem Relation Age of Onset   Diabetes Mother    Hypertension Mother    Hyperlipidemia Mother     Past Surgical History:  Procedure Laterality Date   ADENOIDECTOMY     TONSILLECTOMY     WISDOM TOOTH EXTRACTION      ROS: Review of Systems Negative except as stated above  PHYSICAL EXAM: BP 97/62 (BP Location: Left Arm, Patient Position: Sitting, Cuff Size: Normal)   Pulse 85   Temp 97.9 F (36.6 C) (Oral)   Ht 5\' 7"  (1.702 m)   Wt 195 lb (88.5 kg)   LMP 10/01/2022 Comment: neg preg test 07/30/2022  SpO2 100%   BMI 30.54 kg/m   Physical Exam  General appearance - alert, well appearing, young female and in no distress Mental status -patient is withdrawn and seemingly not wanting to be engaged in conversation Chest -  clear to auscultation, no wheezes, rales or rhonchi, symmetric air entry Heart - normal rate, regular rhythm, normal S1, S2, no murmurs, rubs, clicks or gallops      Latest Ref Rng & Units 04/19/2023    4:25 AM 04/18/2023    9:10 AM 04/17/2023    5:23 AM  CMP  Glucose 70 - 99 mg/dL 161  82  096   BUN 6 - 20 mg/dL 6  <5  5   Creatinine 0.45 - 1.00 mg/dL 4.09  8.11  9.14   Sodium 135 - 145 mmol/L 138  137  133   Potassium 3.5 - 5.1 mmol/L 3.7  3.4  3.5   Chloride 98 - 111  mmol/L 103  110  105   CO2 22 - 32 mmol/L 21  20  17    Calcium 8.9 - 10.3 mg/dL 9.6  8.5  8.9    Lipid Panel  No results found for: "CHOL", "TRIG", "HDL", "CHOLHDL", "VLDL", "LDLCALC", "LDLDIRECT"  CBC    Component Value Date/Time   WBC 14.5 (H) 04/19/2023 0425   RBC 3.48 (L) 04/19/2023 0425   HGB 10.9 (L) 04/19/2023 0425   HCT 32.5 (L) 04/19/2023 0425   PLT 209 04/19/2023 0425   MCV 93.4 04/19/2023 0425   MCH 31.3 04/19/2023 0425   MCHC 33.5 04/19/2023 0425   RDW 13.7 04/19/2023 0425   LYMPHSABS 2.1 04/16/2023 2200   MONOABS 0.8 04/16/2023 2200   EOSABS 0.0 04/16/2023 2200   BASOSABS 0.0 04/16/2023 2200    ASSESSMENT AND PLAN: 1. Hospital discharge follow-up (Primary)   2. Mild persistent asthma without complication Stable and close to baseline.  Continue Dulera inhaler.  Continue albuterol inhaler as needed.  Refill sent on these inhalers and the Singulair. She will continue to follow-up with her obstetrician during her pregnancy - mometasone-formoterol (DULERA) 100-5 MCG/ACT AERO; Inhale 2 puffs into the lungs 2 (two) times daily.  Dispense: 1 each; Refill: 6 - albuterol (VENTOLIN HFA) 108 (90 Base) MCG/ACT inhaler; Inhale 2 puffs into the lungs every 6 (six) hours as needed for wheezing or shortness of breath.  Dispense: 8 g; Refill: 12        Patient was given the opportunity to ask questions.  Patient verbalized understanding of the plan and was able to repeat key elements of the plan.    This documentation was completed using Paediatric nurse.  Any transcriptional errors are unintentional.  No orders of the defined types were placed in this encounter.    Requested Prescriptions    No prescriptions requested or ordered in this encounter    No follow-ups on file.  Jonah Blue, MD, FACP

## 2023-04-29 ENCOUNTER — Other Ambulatory Visit: Payer: Self-pay

## 2023-05-05 NOTE — Telephone Encounter (Signed)
 Copied from CRM [id number]. Topic: Clinical - Prescription Issue >> May 05, 2023  3:25 PM Ivette P wrote: Reason for CRM: Neysa Bonito called in to get fax for [facility] to send prescription order for a nebulizer  for the pt. Christy asked if can be on the lookout for fax

## 2023-05-06 NOTE — Telephone Encounter (Signed)
 I have not received a fax for this patient at this time.

## 2023-05-07 NOTE — Telephone Encounter (Signed)
 Form received and completed.  Will be in with the other forms left on your desk.

## 2023-05-08 NOTE — Telephone Encounter (Signed)
 Document signed by PCP. Form successfully faxed back to [facility] on 05/08/2023. Faxed to [phone].

## 2023-06-05 ENCOUNTER — Telehealth: Payer: Self-pay

## 2023-06-05 NOTE — Telephone Encounter (Signed)
 OV notes from 04/22/2023 were successfully faxed to [facility]. Fax #: [phone].

## 2023-06-05 NOTE — Telephone Encounter (Unsigned)
 Copied from CRM [id number]. Topic: General - Other >> Jun 05, 2023  4:12 PM Eunice Blase wrote: Reason for CRM: Received call from [facility] per [place] ph: [phone] fax: [phone] need documentation regarding reason for Nebulizer machine, mouth piece, and mask. They faxed documentation on 05/13/2023.

## 2023-06-11 NOTE — Telephone Encounter (Signed)
 Sandy called from [place] reporting that they have yet to receive the fax, the fax number listed below is not correct. The correct fax is   Fax: [phone]  Best contact: [phone]

## 2023-06-19 ENCOUNTER — Telehealth: Payer: Self-pay

## 2023-06-19 NOTE — Telephone Encounter (Signed)
 FYI  Copied from CRM [id number]. Topic: General - Other  >> Jun 05, 2023  4:12 PM Felizardo Hotter wrote:  Reason for CRM: Received call from [facility] per [place] ph: [phone] fax: [phone] need documentation regarding reason for Nebulizer machine, mouth piece, and mask. They faxed documentation on 05/13/2023.  >> Jun 19, 2023 11:12 AM Zipporah Him wrote: Calling again to state they have not received the documentation on why this patient is needing the device, they cannot send to insurance until they get it. Confirmed fax from earlier message but it appears to have a typo, correct fax would be [phone] or [phone].Please send to one of these fax number asap so they can complete this for the patient.

## 2023-06-19 NOTE — Telephone Encounter (Signed)
 Routing to CMA

## 2023-06-19 NOTE — Telephone Encounter (Signed)
 Copied from CRM [id number]. Topic: General - Other >> Jun 19, 2023 11:12 AM Zipporah Him wrote: Calling again to state they have not received the documentation on why this patient is needing the device, they cannot send to insurance until they get it. Confirmed fax from earlier message but it appears to have a typo, correct fax would be [phone] or [phone].Please send to one of these fax number asap so they can complete this for the patient.

## 2023-06-19 NOTE — Telephone Encounter (Signed)
 Rexene Catching w/ [facility] states she has not received the OV notes supporting dx for the nebulizer.   Requesting notes to be faxed to: [phone]  Best contact number: [phone]

## 2023-06-23 MED ORDER — ALBUTEROL SULFATE (2.5 MG/3ML) 0.083% IN NEBU: 2.5000 mg | INHALATION_SOLUTION | Freq: Four times a day (QID) | RESPIRATORY_TRACT | 1 refills | Status: DC | PRN

## 2023-06-23 NOTE — Telephone Encounter (Signed)
 Called & spoke to Jamie at [place]. Carolyn Cisco confirmed that previous fax with OV notes from 04/22/2023 & form was received on 06/20/2023. [facility] is requesting for clarification on what solution will be used for he nebulizer.  Dr.Johnson please prescribe the nebulizer solution needed so that I may fax back and updates medication list.

## 2023-06-23 NOTE — Telephone Encounter (Signed)
 Med list with corresponding nebulizer medication successfully sent to [facility] on 06/23/2023. Fax #: [phone].

## 2023-06-23 NOTE — Addendum Note (Signed)
 Addended by: Concetta Dee B on: 06/23/2023 01:37 PM   Modules accepted: Orders

## 2023-06-23 NOTE — Telephone Encounter (Signed)
 Albuterol  nebulizer solution was sent to [facility] at [facility].

## 2023-06-23 NOTE — Telephone Encounter (Signed)
 Called & spoke to Jamie at [place]. Carolyn Cisco confirmed that previous fax with OV notes from 04/22/2023 & form was received on 06/20/2023.

## 2023-11-05 ENCOUNTER — Ambulatory Visit (HOSPITAL_COMMUNITY)
Admission: EM | Admit: 2023-11-05 | Discharge: 2023-11-05 | Disposition: A | Discharge status: Home / Self Care | Attending: Family Medicine | Admitting: Family Medicine

## 2023-11-05 ENCOUNTER — Ambulatory Visit (INDEPENDENT_AMBULATORY_CARE_PROVIDER_SITE_OTHER)

## 2023-11-05 ENCOUNTER — Encounter (HOSPITAL_COMMUNITY): Payer: Self-pay | Admitting: Emergency Medicine

## 2023-11-05 DIAGNOSIS — R109 Unspecified abdominal pain: Secondary | ICD-10-CM | POA: Insufficient documentation

## 2023-11-05 DIAGNOSIS — Z3202 Encounter for pregnancy test, result negative: Secondary | ICD-10-CM

## 2023-11-05 LAB — POCT URINALYSIS DIP (MANUAL ENTRY)
Blood, UA: NEGATIVE
Glucose, UA: NEGATIVE mg/dL
Leukocytes, UA: NEGATIVE
Nitrite, UA: NEGATIVE
Spec Grav, UA: 1.025 (ref 1.010–1.025)
Urobilinogen, UA: 1 U/dL
pH, UA: 6 (ref 5.0–8.0)

## 2023-11-05 LAB — POCT URINE PREGNANCY: Preg Test, Ur: NEGATIVE

## 2023-11-05 NOTE — ED Triage Notes (Signed)
 Pt reports intermittent lower abdominal pain for past few days. Pt requesting pregnancy and STD testing.  Denies urinary, bowel, n/v. Reports her doctor sent her for endometriosis testing after her delivery due to having issues, but believes this pain is different.

## 2023-11-05 NOTE — ED Notes (Signed)
 Waiting outside with baby

## 2023-11-05 NOTE — ED Provider Notes (Addendum)
 [facility]    CSN: [id number] Arrival date & time: 11/05/23  1050      History   Chief Complaint Chief Complaint  Patient presents with   Abdominal Pain    HPI Misty Sanders is a 28 y.o. female.    Abdominal Pain  Patient is here for lower abdominal pain for the last few days.  Off/on.  No pain at this time, but just soreness to the lower abdominal.  No urinary symptoms.  No diarrhea or constipation.  No fevers/chills.  No n/v.  Some vaginal irritation but no discharge.  No known exposures to STD  She has been seeing her ob/gyn for this.  Question endometriosis vs scar tissue from c-section.  Has had an u/s last week, follow up tomorrow for results.           Past Medical History:  Diagnosis Date   Arthritis    Asthma    H/O trichomoniasis 06/11/2022    Patient Active Problem List   Diagnosis Date Noted   [redacted] weeks gestation of pregnancy 04/17/2023   Hypokalemia 04/17/2023   Acute asthma exacerbation 04/16/2023   Acne 09/03/2022   Lumbar radiculopathy 12/28/2020   Onychomycosis 12/28/2020   Tinea pedis 12/28/2020    Past Surgical History:  Procedure Laterality Date   ADENOIDECTOMY     CESAREAN SECTION     TONSILLECTOMY     WISDOM TOOTH EXTRACTION      OB History     Gravida  1   Para  0   Term  0   Preterm  0   AB  0   Living  0      SAB  0   IAB  0   Ectopic  0   Multiple  0   Live Births  0            Home Medications    Prior to Admission medications   Medication Sig Start Date End Date Taking? Authorizing Provider  albuterol  (PROVENTIL ) (2.5 MG/3ML) 0.083% nebulizer solution Take 3 mLs (2.5 mg total) by nebulization every 6 (six) hours as needed for wheezing or shortness of breath. 06/23/23   Vicci Barnie NOVAK, MD  albuterol  (VENTOLIN  HFA) 108 [phone] Base) MCG/ACT inhaler Inhale 2 puffs into the lungs every 6 (six) hours as needed for wheezing or shortness of breath. 04/22/23   Vicci Barnie NOVAK,  MD  mometasone -formoterol  (DULERA ) 100-5 MCG/ACT AERO Inhale 2 puffs into the lungs 2 (two) times daily. 04/22/23   Vicci Barnie NOVAK, MD  montelukast  (SINGULAIR ) 10 MG tablet Take 1 tablet (10 mg total) by mouth at bedtime. 04/20/23   Rojelio Nest, DO  prenatal vitamin w/FE, FA (NATACHEW) 29-1 MG CHEW chewable tablet Chew 1 tablet by mouth daily at 12 noon.    [provider]    Family History Family History  Problem Relation Age of Onset   Diabetes Mother    Hypertension Mother    Hyperlipidemia Mother     Social History Social History   Tobacco Use   Smoking status: Former    Current packs/day: 0.25    Types: Cigarettes   Smokeless tobacco: Never  Vaping Use   Vaping status: Never Used  Substance Use Topics   Alcohol use: Not Currently   Drug use: Not Currently    Types: Marijuana    Comment: denies any use currently     Allergies   Fish allergy, Penicillins, Egg-derived products, Peanuts [peanut oil], Sulfa antibiotics,  and Latex   Review of Systems Review of Systems  Constitutional: Negative.   HENT: Negative.    Respiratory: Negative.    Cardiovascular: Negative.   Gastrointestinal:  Positive for abdominal pain.  Musculoskeletal: Negative.   Psychiatric/Behavioral: Negative.       Physical Exam Triage Vital Signs ED Triage Vitals  Encounter Vitals Group     BP 11/05/23 1133 103/67     Girls Systolic BP Percentile --      Girls Diastolic BP Percentile --      Boys Systolic BP Percentile --      Boys Diastolic BP Percentile --      Pulse Rate 11/05/23 1133 77     Resp 11/05/23 1133 17     Temp 11/05/23 1133 97.7 F (36.5 C)     Temp Source 11/05/23 1133 Oral     SpO2 11/05/23 1133 97 %     Weight --      Height --      Head Circumference --      Peak Flow --      Pain Score 11/05/23 1132 6     Pain Loc --      Pain Education --      Exclude from Growth Chart --    No data found.  Updated Vital Signs BP 103/67 (BP Location: Left  Arm)   Pulse 77   Temp 97.7 F (36.5 C) (Oral)   Resp 17   LMP 10/19/2023   SpO2 97%   Breastfeeding No   Visual Acuity Right Eye Distance:   Left Eye Distance:   Bilateral Distance:    Right Eye Near:   Left Eye Near:    Bilateral Near:     Physical Exam Constitutional:      Appearance: She is well-developed and normal weight.  Cardiovascular:     Rate and Rhythm: Normal rate.  Pulmonary:     Effort: Pulmonary effort is normal.     Breath sounds: Normal breath sounds.  Abdominal:     General: Bowel sounds are normal.     Palpations: Abdomen is soft.     Comments: Mild tenderness across the lower abdomen;  mild epigastric tenderness; no guarding or rebound  Skin:    General: Skin is warm.  Neurological:     General: No focal deficit present.     Mental Status: She is alert.  Psychiatric:        Mood and Affect: Mood normal.      UC Treatments / Results  Labs (all labs ordered are listed, but only abnormal results are displayed) Labs Reviewed  POCT URINALYSIS DIP (MANUAL ENTRY) - Abnormal; Notable for the following components:      Result Value   Color, UA straw (*)    Clarity, UA cloudy (*)    Bilirubin, UA small (*)    Ketones, POC UA trace (5) (*)    Protein Ur, POC trace (*)    All other components within normal limits  POCT URINE PREGNANCY  CERVICOVAGINAL ANCILLARY ONLY    EKG   Radiology No results found.  Procedures Procedures (including critical care time)  Medications Ordered in UC Medications - No data to display  Initial Impression / Assessment and Plan / UC Course  I have reviewed the triage vital signs and the nursing notes.  Pertinent labs & imaging results that were available during my care of the patient were reviewed by me and considered in my medical decision making (  see chart for details).   Final Clinical Impressions(s) / UC Diagnoses   Final diagnoses:  Abdominal pain, unspecified abdominal location     Discharge  Instructions      You were seen today for abdominal pain.  Your urine does not show any infection.  You xray appears normal.  If the radiologist reads this differently we will notify you.  Your vaginal swab will be resulted tomorrow and if anything is positive for treatment you will be notified.  Please keep your appointment tomorrow for follow up of your ultrasound.  Please see your primary care provider or ob/gyn if you continue with symptoms.     ED Prescriptions   None    PDMP not reviewed this encounter.   Darral Longs, MD 11/05/23 [id number]    Darral Longs, MD 11/05/23 1240

## 2023-11-05 NOTE — Discharge Instructions (Signed)
 You were seen today for abdominal pain.  Your urine does not show any infection.  You xray appears normal.  If the radiologist reads this differently we will notify you.  Your vaginal swab will be resulted tomorrow and if anything is positive for treatment you will be notified.  Please keep your appointment tomorrow for follow up of your ultrasound.  Please see your primary care provider or ob/gyn if you continue with symptoms.

## 2023-11-06 ENCOUNTER — Ambulatory Visit (HOSPITAL_COMMUNITY): Payer: Self-pay

## 2023-11-06 ENCOUNTER — Other Ambulatory Visit (HOSPITAL_COMMUNITY): Payer: Self-pay

## 2023-11-06 LAB — CERVICOVAGINAL ANCILLARY ONLY
Bacterial Vaginitis (gardnerella): POSITIVE — AB
Candida Glabrata: NEGATIVE
Candida Vaginitis: POSITIVE — AB
Chlamydia: NEGATIVE
Comment: NEGATIVE
Comment: NEGATIVE
Comment: NEGATIVE
Comment: NEGATIVE
Comment: NEGATIVE
Comment: NORMAL
Neisseria Gonorrhea: NEGATIVE
Trichomonas: NEGATIVE

## 2023-11-06 MED ORDER — FLUCONAZOLE 150 MG PO TABS
150.0000 mg | ORAL_TABLET | Freq: Once | ORAL | 0 refills | Status: DC
  Filled 2023-11-06: qty 2, 2d supply, fill #0

## 2023-11-06 MED ORDER — METRONIDAZOLE 500 MG PO TABS
500.0000 mg | ORAL_TABLET | Freq: Two times a day (BID) | ORAL | 0 refills | Status: DC
Start: 2023-11-06 — End: 2023-11-13
  Filled 2023-11-06: qty 14, 7d supply, fill #0

## 2023-11-07 ENCOUNTER — Other Ambulatory Visit (HOSPITAL_COMMUNITY): Payer: Self-pay

## 2023-11-07 MED ORDER — METRONIDAZOLE 500 MG PO TABS: 500.0000 mg | ORAL_TABLET | Freq: Two times a day (BID) | ORAL | 0 refills | Status: AC

## 2023-11-07 MED ORDER — FLUCONAZOLE 150 MG PO TABS: 150.0000 mg | ORAL_TABLET | Freq: Once | ORAL | 0 refills | Status: AC

## 2024-01-05 ENCOUNTER — Emergency Department (HOSPITAL_COMMUNITY)
Admission: EM | Admit: 2024-01-05 | Discharge: 2024-01-05 | Disposition: A | Discharge status: Home / Self Care | Attending: Emergency Medicine | Admitting: Emergency Medicine

## 2024-01-05 ENCOUNTER — Other Ambulatory Visit: Payer: Self-pay

## 2024-01-05 ENCOUNTER — Emergency Department (HOSPITAL_COMMUNITY)

## 2024-01-05 DIAGNOSIS — Z77098 Contact with and (suspected) exposure to other hazardous, chiefly nonmedicinal, chemicals: Secondary | ICD-10-CM | POA: Diagnosis not present

## 2024-01-05 DIAGNOSIS — R1110 Vomiting, unspecified: Secondary | ICD-10-CM | POA: Insufficient documentation

## 2024-01-05 DIAGNOSIS — Z91010 Allergy to peanuts: Secondary | ICD-10-CM | POA: Insufficient documentation

## 2024-01-05 DIAGNOSIS — J45901 Unspecified asthma with (acute) exacerbation: Secondary | ICD-10-CM | POA: Diagnosis not present

## 2024-01-05 DIAGNOSIS — Z91040 Latex allergy status: Secondary | ICD-10-CM | POA: Insufficient documentation

## 2024-01-05 DIAGNOSIS — Z7951 Long term (current) use of inhaled steroids: Secondary | ICD-10-CM | POA: Diagnosis not present

## 2024-01-05 DIAGNOSIS — R000 Tachycardia, unspecified: Secondary | ICD-10-CM | POA: Insufficient documentation

## 2024-01-05 DIAGNOSIS — R0602 Shortness of breath: Secondary | ICD-10-CM | POA: Diagnosis present

## 2024-01-05 LAB — CBC WITH DIFFERENTIAL/PLATELET
Abs Immature Granulocytes: 0.03 K/uL (ref 0.00–0.07)
Basophils Absolute: 0 K/uL (ref 0.0–0.1)
Basophils Relative: 0 %
Eosinophils Absolute: 0.2 K/uL (ref 0.0–0.5)
Eosinophils Relative: 2 %
HCT: 41.4 % (ref 36.0–46.0)
Hemoglobin: 14 g/dL (ref 12.0–15.0)
Immature Granulocytes: 0 %
Lymphocytes Relative: 49 %
Lymphs Abs: 5 K/uL — ABNORMAL HIGH (ref 0.7–4.0)
MCH: 31.3 pg (ref 26.0–34.0)
MCHC: 33.8 g/dL (ref 30.0–36.0)
MCV: 92.4 fL (ref 80.0–100.0)
Monocytes Absolute: 0.7 K/uL (ref 0.1–1.0)
Monocytes Relative: 7 %
Neutro Abs: 4.3 K/uL (ref 1.7–7.7)
Neutrophils Relative %: 42 %
Platelets: 243 K/uL (ref 150–400)
RBC: 4.48 MIL/uL (ref 3.87–5.11)
RDW: 13.4 % (ref 11.5–15.5)
WBC: 10.2 K/uL (ref 4.0–10.5)
nRBC: 0 % (ref 0.0–0.2)

## 2024-01-05 LAB — I-STAT CHEM 8, ED
BUN: 15 mg/dL (ref 6–20)
Calcium, Ion: 1.12 mmol/L — ABNORMAL LOW (ref 1.15–1.40)
Chloride: 106 mmol/L (ref 98–111)
Creatinine, Ser: 1 mg/dL (ref 0.44–1.00)
Glucose, Bld: 123 mg/dL — ABNORMAL HIGH (ref 70–99)
HCT: 39 % (ref 36.0–46.0)
Hemoglobin: 13.3 g/dL (ref 12.0–15.0)
Potassium: 5.7 mmol/L — ABNORMAL HIGH (ref 3.5–5.1)
Sodium: 139 mmol/L (ref 135–145)
TCO2: 25 mmol/L (ref 22–32)

## 2024-01-05 MED ORDER — SODIUM CHLORIDE 0.9 % IV BOLUS
1000.0000 mL | Freq: Once | INTRAVENOUS | Status: AC
  Administered 2024-01-05: 1000 mL via INTRAVENOUS

## 2024-01-05 MED ORDER — ONDANSETRON HCL 4 MG/2ML IJ SOLN: 4.0000 mg | Freq: Once | INTRAMUSCULAR | Status: AC

## 2024-01-05 MED ORDER — ACETAMINOPHEN 325 MG PO TABS
650.0000 mg | ORAL_TABLET | Freq: Once | ORAL | Status: AC
  Administered 2024-01-05: 650 mg via ORAL
  Filled 2024-01-05: qty 2

## 2024-01-05 MED ORDER — IPRATROPIUM-ALBUTEROL 0.5-2.5 (3) MG/3ML IN SOLN
3.0000 mL | Freq: Once | RESPIRATORY_TRACT | Status: AC
  Administered 2024-01-05: 3 mL via RESPIRATORY_TRACT
  Filled 2024-01-05: qty 3

## 2024-01-05 MED ORDER — IBUPROFEN 400 MG PO TABS
600.0000 mg | ORAL_TABLET | Freq: Once | ORAL | Status: AC
  Administered 2024-01-05: 600 mg via ORAL
  Filled 2024-01-05: qty 1

## 2024-01-05 MED ORDER — ONDANSETRON HCL 4 MG/2ML IJ SOLN
INTRAMUSCULAR | Status: AC
  Administered 2024-01-05: 4 mg via INTRAVENOUS
  Filled 2024-01-05: qty 2

## 2024-01-05 MED ORDER — ALBUTEROL SULFATE HFA 108 (90 BASE) MCG/ACT IN AERS
1.0000 | INHALATION_SPRAY | Freq: Four times a day (QID) | RESPIRATORY_TRACT | 0 refills | Status: AC | PRN
Start: 2024-01-05 — End: ?

## 2024-01-05 MED ORDER — PREDNISONE 20 MG PO TABS: 20.0000 mg | ORAL_TABLET | Freq: Every day | ORAL | 0 refills | Status: AC

## 2024-01-05 NOTE — Discharge Instructions (Addendum)
 Steroids and albuterol  inhaler sent to your pharmacy.  Please return to emergency department mediately if shortness of breath worsens significantly for reevaluation.  Otherwise you can follow-up with your primary care physician if the symptoms are stable but still present in 3 to 5 days.  Please do not reexpose yourself to any chemicals in the next 2 weeks to allow time for your lungs to heal.  At that time when cleaning please wear a mask to protect yourself.

## 2024-01-05 NOTE — ED Triage Notes (Signed)
 Patient with hx of asthma from home after having exacerbation d/t using multiple household cleaners. EMS administered 20mg  albuterol , 2g mag, 125mg  solumedrol with minimal improvement.They report diffuse wheezing despite interventions. Increased anxiety in route so CPAP was not attempted.

## 2024-01-05 NOTE — ED Provider Notes (Signed)
 [place] EMERGENCY DEPARTMENT AT [facility] Provider Note   CSN: [id number] Arrival date & time: 01/05/24  1706     Patient presents with: Asthma   Misty Sanders is a 28 y.o. female.   Patient is a 28 year old female with past medical history of asthma presenting for difficulty breathing.  Patient states she was cleaning her bathroom with Clorox Bleach when she began having shortness of breath and wheezing.  EMS was called.  They noted wheezing throughout.  Gave her albuterol , 2 g of magnesium , and 125 mg of Solu-Medrol .  On my evaluation, patient is actively vomiting in an emesis bag.  She states her breathing has improved but it is still labored.  Admits to some chest tightness while vomiting.  States she infrequently has asthma exacerbations and was not having symptoms prior to exposure to cleaning products.  The history is provided by the patient. No language interpreter was used.  Asthma Associated symptoms include shortness of breath. Pertinent negatives include no chest pain and no abdominal pain.       Prior to Admission medications   Medication Sig Start Date End Date Taking? Authorizing Provider  albuterol  (VENTOLIN  HFA) 108 (90 Base) MCG/ACT inhaler Inhale 1-2 puffs into the lungs every 6 (six) hours as needed for wheezing or shortness of breath. 01/05/24  Yes Elnor Hila P, DO  mometasone -formoterol  (DULERA ) 100-5 MCG/ACT AERO Inhale 2 puffs into the lungs 2 (two) times daily. 04/22/23   Vicci Barnie NOVAK, MD  montelukast  (SINGULAIR ) 10 MG tablet Take 1 tablet (10 mg total) by mouth at bedtime. 04/20/23   Rojelio Nest, DO  prenatal vitamin w/FE, FA (NATACHEW) 29-1 MG CHEW chewable tablet Chew 1 tablet by mouth daily at 12 noon.    [provider]    Allergies: Fish allergy, Penicillins, Egg protein-containing drug products, Peanuts [peanut oil], Sulfa antibiotics, and Latex    Review of Systems  Constitutional:  Negative for chills and  fever.  HENT:  Negative for ear pain and sore throat.   Eyes:  Negative for pain and visual disturbance.  Respiratory:  Positive for shortness of breath and wheezing. Negative for cough.   Cardiovascular:  Negative for chest pain and palpitations.  Gastrointestinal:  Negative for abdominal pain and vomiting.  Genitourinary:  Negative for dysuria and hematuria.  Musculoskeletal:  Negative for arthralgias and back pain.  Skin:  Negative for color change and rash.  Neurological:  Negative for seizures and syncope.  All other systems reviewed and are negative.   Updated Vital Signs BP (!) 108/58 (BP Location: Right Arm)   Pulse (!) 110   Temp 98.4 F (36.9 C) (Oral)   Resp 18   LMP 12/09/2023 (Exact Date)   SpO2 100%   Physical Exam Vitals and nursing note reviewed.  Constitutional:      General: She is not in acute distress.    Appearance: She is well-developed.  HENT:     Head: Normocephalic and atraumatic.  Eyes:     Conjunctiva/sclera: Conjunctivae normal.  Cardiovascular:     Rate and Rhythm: Regular rhythm. Tachycardia present.     Heart sounds: No murmur heard. Pulmonary:     Effort: Pulmonary effort is normal. No respiratory distress.     Breath sounds: Wheezing present.  Abdominal:     Palpations: Abdomen is soft.     Tenderness: There is no abdominal tenderness.  Musculoskeletal:        General: No swelling.     Cervical  back: Neck supple.  Skin:    General: Skin is warm and dry.     Capillary Refill: Capillary refill takes less than 2 seconds.  Neurological:     Mental Status: She is alert.  Psychiatric:        Mood and Affect: Mood normal.     (all labs ordered are listed, but only abnormal results are displayed) Labs Reviewed  CBC WITH DIFFERENTIAL/PLATELET - Abnormal; Notable for the following components:      Result Value   Lymphs Abs 5.0 (*)    All other components within normal limits  I-STAT CHEM 8, ED - Abnormal; Notable for the following  components:   Potassium 5.7 (*)    Glucose, Bld 123 (*)    Calcium , Ion 1.12 (*)    All other components within normal limits    EKG: EKG Interpretation Date/Time:  Monday January 05 2024 17:13:00 EST Ventricular Rate:  121 PR Interval:  147 QRS Duration:  81 QT Interval:  327 QTC Calculation: 464 R Axis:   84  Text Interpretation: Sinus tachycardia Prominent P waves, nondiagnostic Borderline T wave abnormalities Confirmed by Bari Pfeiffer ([id number]) on 01/06/2024 10:53:56 AM  Radiology: No results found.   Procedures   Medications Ordered in the ED  ondansetron  (ZOFRAN ) injection 4 mg (4 mg Intravenous Given 01/05/24 1719)  sodium chloride  0.9 % bolus 1,000 mL (0 mLs Intravenous Stopped 01/05/24 2018)  ipratropium-albuterol  (DUONEB) 0.5-2.5 (3) MG/3ML nebulizer solution 3 mL (3 mLs Nebulization Given 01/05/24 1950)  acetaminophen  (TYLENOL ) tablet 650 mg (650 mg Oral Given 01/05/24 1949)  ibuprofen  (ADVIL ) tablet 600 mg (600 mg Oral Given 01/05/24 1949)                                    Medical Decision Making Amount and/or Complexity of Data Reviewed Labs: ordered. Radiology: ordered.  Risk OTC drugs. Prescription drug management.   28 year old female with past medical history of asthma presenting for difficulty breathing.  Patient is alert oriented x 3, tachycardic at 122, tachypneic at 33, and actively vomiting.  She has equal bilateral breath sounds with normal wheezing throughout.  Satting at 99% on room air.  Received duoneb and solumedrol in route by ems.  Zofran  given for emesis. Albuterol  neb given for continued wheezing.   CXR stable. Pt observed in ED for approx 2.5 hours with improvement of wheezing and respiratory symptoms. Requesting dishcarge at this time. Pt recommended for prompt return to ED if symptoms reoccur. Her vitals remain stable with no hypoxia. Recommended to stay away from chemicals for the next 1-2 weeks then wearing proper masks to  avoid inhaling.   Patient in no distress and overall condition improved here in the ED. Detailed discussions were had with the patient regarding current findings, and need for close f/u with PCP or on call doctor. The patient has been instructed to return immediately if the symptoms worsen in any way for re-evaluation. Patient verbalized understanding and is in agreement with current care plan. All questions answered prior to discharge.     Final diagnoses:  Exacerbation of asthma, unspecified asthma severity, unspecified whether persistent  Chemical exposure    ED Discharge Orders          Ordered    predniSONE  (DELTASONE ) 20 MG tablet  Daily        01/05/24 1957    albuterol  (VENTOLIN  HFA) 108 (90 Base) MCG/ACT  inhaler  Every 6 hours PRN        01/05/24 1957               Elnor Bernarda SQUIBB, DO 01/11/24 1504

## 2024-01-05 NOTE — ED Notes (Signed)
 Patient given 3 blankets per request. Patient agitated and with RN demanding warm blankets. RN explained that the blanket warmer was just filled and has not had time to warm them yet. Patient pushed them all off her.  RN informed patient on status of workup. Patient did not respond. RN asked patient if she would like the light turned down. Patient did not respond to RN. RN walked out of the room. Call light within reach.

## 2024-01-05 NOTE — ED Notes (Signed)
 MD notified about patient's complaint of a headache. Patient informed. ER tech delivered more blankets to the patient.

## 2024-01-09 ENCOUNTER — Telehealth: Payer: Self-pay | Admitting: Internal Medicine

## 2024-01-09 NOTE — Telephone Encounter (Signed)
 Copied from CRM #[id number]. Topic: Medical Record Request - Records Request >> Jan 09, 2024 11:51 AM Tinnie BROCKS wrote: Reason for CRM: Pt calling to request proof of TB skin test from 2023 to be either sent to her mychart or sent to her email on file. She says she is unable to find it in her mychart.

## 2024-01-09 NOTE — Telephone Encounter (Signed)
Copy mailed.

## 2024-01-13 ENCOUNTER — Ambulatory Visit

## 2024-02-03 ENCOUNTER — Ambulatory Visit: Admitting: Internal Medicine

## 2024-02-08 ENCOUNTER — Ambulatory Visit (HOSPITAL_COMMUNITY)
Admission: EM | Admit: 2024-02-08 | Discharge: 2024-02-08 | Disposition: A | Discharge status: Home / Self Care | Attending: Internal Medicine | Admitting: Internal Medicine

## 2024-02-08 ENCOUNTER — Encounter (HOSPITAL_COMMUNITY): Payer: Self-pay | Admitting: Emergency Medicine

## 2024-02-08 DIAGNOSIS — N898 Other specified noninflammatory disorders of vagina: Secondary | ICD-10-CM

## 2024-02-08 DIAGNOSIS — L42 Pityriasis rosea: Secondary | ICD-10-CM

## 2024-02-08 DIAGNOSIS — Z113 Encounter for screening for infections with a predominantly sexual mode of transmission: Secondary | ICD-10-CM | POA: Diagnosis not present

## 2024-02-08 MED ORDER — TRIAMCINOLONE ACETONIDE 0.1 % EX CREA: 1.0000 | TOPICAL_CREAM | Freq: Two times a day (BID) | CUTANEOUS | 0 refills | Status: AC

## 2024-02-08 MED ORDER — HYDROCORTISONE 2.5 % EX LOTN: TOPICAL_LOTION | Freq: Two times a day (BID) | CUTANEOUS | 0 refills | Status: AC

## 2024-02-08 NOTE — ED Provider Notes (Signed)
 [facility]    CSN: [id number] Arrival date & time: 02/08/24  1752      History   Chief Complaint No chief complaint on file.   HPI Misty Sanders is a 28 y.o. female.   28 year old female presents urgent care with complaints of a diffuse rash and vaginal discharge.  She reports that both symptoms started about 2 weeks ago.  The rash for started around her trunk and has spread to her back, arms and neck.  The areas are very itchy.  No one else in the household has any similar symptoms.  She denies any fevers or chills.  She has also had an increase in vaginal discharge over the last 2 weeks and would like to have STI testing done.  She denies any vaginal pain, vaginal irritation     Past Medical History:  Diagnosis Date   Arthritis    Asthma    H/O trichomoniasis 06/11/2022    Patient Active Problem List   Diagnosis Date Noted   [redacted] weeks gestation of pregnancy 04/17/2023   Hypokalemia 04/17/2023   Acute asthma exacerbation 04/16/2023   Acne 09/03/2022   Lumbar radiculopathy 12/28/2020   Onychomycosis 12/28/2020   Tinea pedis 12/28/2020    Past Surgical History:  Procedure Laterality Date   ADENOIDECTOMY     CESAREAN SECTION     TONSILLECTOMY     WISDOM TOOTH EXTRACTION      OB History     Gravida  1   Para  0   Term  0   Preterm  0   AB  0   Living  0      SAB  0   IAB  0   Ectopic  0   Multiple  0   Live Births  0            Home Medications    Prior to Admission medications  Medication Sig Start Date End Date Taking? Authorizing Provider  hydrocortisone  2.5 % lotion Apply topically 2 (two) times daily. May apply this to the face and neck 02/08/24  Yes Teresa Almarie LABOR, PA-C  triamcinolone  cream (KENALOG ) 0.1 % Apply 1 Application topically 2 (two) times daily. May apply this to the arms, legs and trunk.  Do not apply this to the face or neck 02/08/24  Yes Conor Lata, Almarie LABOR, PA-C  albuterol  (VENTOLIN  HFA) 108 (90  Base) MCG/ACT inhaler Inhale 1-2 puffs into the lungs every 6 (six) hours as needed for wheezing or shortness of breath. 01/05/24   Elnor Hila P, DO  mometasone -formoterol  (DULERA ) 100-5 MCG/ACT AERO Inhale 2 puffs into the lungs 2 (two) times daily. 04/22/23   Vicci Barnie NOVAK, MD  montelukast  (SINGULAIR ) 10 MG tablet Take 1 tablet (10 mg total) by mouth at bedtime. 04/20/23   Rojelio Nest, DO  prenatal vitamin w/FE, FA (NATACHEW) 29-1 MG CHEW chewable tablet Chew 1 tablet by mouth daily at 12 noon.    [provider]    Family History Family History  Problem Relation Age of Onset   Diabetes Mother    Hypertension Mother    Hyperlipidemia Mother     Social History Social History[1]   Allergies   Fish allergy, Penicillins, Egg protein-containing drug products, Peanuts [peanut oil], Sulfa antibiotics, and Latex   Review of Systems Review of Systems  Constitutional:  Negative for chills and fever.  HENT:  Negative for ear pain and sore throat.   Eyes:  Negative for pain and  visual disturbance.  Respiratory:  Negative for cough and shortness of breath.   Cardiovascular:  Negative for chest pain and palpitations.  Gastrointestinal:  Negative for abdominal pain and vomiting.  Genitourinary:  Positive for vaginal discharge. Negative for dysuria and hematuria.  Musculoskeletal:  Negative for arthralgias and back pain.  Skin:  Positive for rash. Negative for color change.  Neurological:  Negative for seizures and syncope.  All other systems reviewed and are negative.    Physical Exam Triage Vital Signs ED Triage Vitals [02/08/24 1819]  Encounter Vitals Group     BP 94/62     Girls Systolic BP Percentile      Girls Diastolic BP Percentile      Boys Systolic BP Percentile      Boys Diastolic BP Percentile      Pulse Rate 66     Resp 16     Temp 98 F (36.7 C)     Temp Source Oral     SpO2 98 %     Weight      Height      Head Circumference      Peak Flow       Pain Score      Pain Loc      Pain Education      Exclude from Growth Chart    No data found.  Updated Vital Signs BP 94/62 (BP Location: Left Arm)   Pulse 66   Temp 98 F (36.7 C) (Oral)   Resp 16   LMP 02/04/2024   SpO2 98%   Visual Acuity Right Eye Distance:   Left Eye Distance:   Bilateral Distance:    Right Eye Near:   Left Eye Near:    Bilateral Near:     Physical Exam Vitals and nursing note reviewed.  Constitutional:      General: She is not in acute distress.    Appearance: She is well-developed.  HENT:     Head: Normocephalic and atraumatic.  Eyes:     Conjunctiva/sclera: Conjunctivae normal.  Cardiovascular:     Rate and Rhythm: Normal rate and regular rhythm.     Heart sounds: No murmur heard. Pulmonary:     Effort: Pulmonary effort is normal. No respiratory distress.     Breath sounds: Normal breath sounds.  Abdominal:     Palpations: Abdomen is soft.     Tenderness: There is no abdominal tenderness.  Musculoskeletal:        General: No swelling.     Cervical back: Neck supple.  Skin:    General: Skin is warm and dry.     Capillary Refill: Capillary refill takes less than 2 seconds.     Comments: See images  Neurological:     Mental Status: She is alert.  Psychiatric:        Mood and Affect: Mood normal.                 UC Treatments / Results  Labs (all labs ordered are listed, but only abnormal results are displayed) Labs Reviewed  CERVICOVAGINAL ANCILLARY ONLY    EKG   Radiology No results found.  Procedures Procedures (including critical care time)  Medications Ordered in UC Medications - No data to display  Initial Impression / Assessment and Plan / UC Course  I have reviewed the triage vital signs and the nursing notes.  Pertinent labs & imaging results that were available during my care of the patient were reviewed by  me and considered in my medical decision making (see chart for details).      Screening examination for STI  Vaginal discharge  Pityriasis rosea   Symptoms and physical exam findings are consistent with pityriasis rosea. This is a rash that is likely due to a virus. This is self limiting and usually resolves on its own in 6-8 weeks. There is not a medication that makes this go away faster but we can prescribe medication to help with the itching.  We can treat with the following:   Triamcinolone  cream twice daily to the affected area as needed for itching/rash.  Do not apply this to the neck or face.  Hydrocortisone  cream twice daily to the face and neck as needed for itching/rash.  Screening swab done today and results will be available in 24-48 hours. We will contact you if we need to arrange additional treatment based on your testing. Negative results will be on your MyChart account. Abstain from sex until you receive your final results.  Use a condom for sexual encounters. If you have any worsening or changing symptoms including abnormal discharge, pelvic pain, abdominal pain, fever, nausea, or vomiting, then you should be reevaluated.   Make sure to stay hydrated by drinking plenty of water.  Return to urgent care or PCP if symptoms worsen or fail to resolve.    Final Clinical Impressions(s) / UC Diagnoses   Final diagnoses:  Screening examination for STI  Vaginal discharge  Pityriasis rosea     Discharge Instructions      Symptoms and physical exam findings are consistent with pityriasis rosea. This is a rash that is likely due to a virus. This is self limiting and usually resolves on its own in 6-8 weeks. There is not a medication that makes this go away faster but we can prescribe medication to help with the itching.  We can treat with the following:   Triamcinolone  cream twice daily to the affected area as needed for itching/rash.  Do not apply this to the neck or face.  Hydrocortisone  cream twice daily to the face and neck as needed for itching/rash.   Screening swab done today and results will be available in 24-48 hours. We will contact you if we need to arrange additional treatment based on your testing. Negative results will be on your MyChart account. Abstain from sex until you receive your final results.  Use a condom for sexual encounters. If you have any worsening or changing symptoms including abnormal discharge, pelvic pain, abdominal pain, fever, nausea, or vomiting, then you should be reevaluated.   Make sure to stay hydrated by drinking plenty of water.  Return to urgent care or PCP if symptoms worsen or fail to resolve.      ED Prescriptions     Medication Sig Dispense Auth. Provider   triamcinolone  cream (KENALOG ) 0.1 % Apply 1 Application topically 2 (two) times daily. May apply this to the arms, legs and trunk.  Do not apply this to the face or neck 454 g Teresa Almarie LABOR, PA-C   hydrocortisone  2.5 % lotion Apply topically 2 (two) times daily. May apply this to the face and neck 59 mL Bellamie Turney, Almarie LABOR, PA-C      PDMP not reviewed this encounter.    [1]  Social History Tobacco Use   Smoking status: Former    Current packs/day: 0.25    Types: Cigarettes   Smokeless tobacco: Never  Vaping Use   Vaping status: Never Used  Substance Use Topics   Alcohol use: Not Currently   Drug use: Not Currently    Types: Marijuana    Comment: denies any use currently     Teresa Almarie LABOR, PA-C 02/08/24 1938

## 2024-02-08 NOTE — ED Triage Notes (Signed)
 Rash started 1-2 weeks ago.  Rash on trunk and extremities.  Patient has used vitamin e, and coco butter.  Patient works at a nursing facility.  No recent trips or over night stays .  Patient is itching, but no pain  Patient has a 90 month old, no breast feeding  Patient requesting std evaluation.  Has seen vaginal discharge with no odor.  This was noticed 2 weeks ago.    Recently car was in the shop just got it back

## 2024-02-08 NOTE — Discharge Instructions (Addendum)
 Symptoms and physical exam findings are consistent with pityriasis rosea. This is a rash that is likely due to a virus. This is self limiting and usually resolves on its own in 6-8 weeks. There is not a medication that makes this go away faster but we can prescribe medication to help with the itching.  We can treat with the following:   Triamcinolone  cream twice daily to the affected area as needed for itching/rash.  Do not apply this to the neck or face.  Hydrocortisone  cream twice daily to the face and neck as needed for itching/rash.  Screening swab done today and results will be available in 24-48 hours. We will contact you if we need to arrange additional treatment based on your testing. Negative results will be on your MyChart account. Abstain from sex until you receive your final results.  Use a condom for sexual encounters. If you have any worsening or changing symptoms including abnormal discharge, pelvic pain, abdominal pain, fever, nausea, or vomiting, then you should be reevaluated.   Make sure to stay hydrated by drinking plenty of water.  Return to urgent care or PCP if symptoms worsen or fail to resolve.

## 2024-02-09 ENCOUNTER — Ambulatory Visit (HOSPITAL_COMMUNITY): Payer: Self-pay

## 2024-02-09 LAB — CERVICOVAGINAL ANCILLARY ONLY
Bacterial Vaginitis (gardnerella): POSITIVE — AB
Candida Glabrata: NEGATIVE
Candida Vaginitis: NEGATIVE
Chlamydia: NEGATIVE
Comment: NEGATIVE
Comment: NEGATIVE
Comment: NEGATIVE
Comment: NEGATIVE
Comment: NEGATIVE
Comment: NORMAL
Neisseria Gonorrhea: NEGATIVE
Trichomonas: NEGATIVE

## 2024-02-09 MED ORDER — METRONIDAZOLE 500 MG PO TABS: 500.0000 mg | ORAL_TABLET | Freq: Two times a day (BID) | ORAL | 0 refills | Status: DC

## 2024-03-02 ENCOUNTER — Ambulatory Visit: Admitting: Internal Medicine

## 2024-03-02 MED ORDER — METRONIDAZOLE 500 MG PO TABS: 500.0000 mg | ORAL_TABLET | Freq: Two times a day (BID) | ORAL | 0 refills | Status: AC

## 2024-03-02 NOTE — Telephone Encounter (Signed)
 Per patient access, this patient was on the phone wondering if her meds could be resent (She claims there was an issue with her insurance at the time, so they wouldn't cover it but it was fixed) She is asking for a callback. the number in the chart is current.  Rx sent to pharmacy on file.

## 2024-06-20 IMAGING — DX DG CHEST 1V PORT
1 series · 1 of 1 positions shown · non-contrast
Comparison: 12/14/2019

CLINICAL DATA: Shortness of breath and cough

EXAM:
PORTABLE CHEST 1 VIEW

[chest]
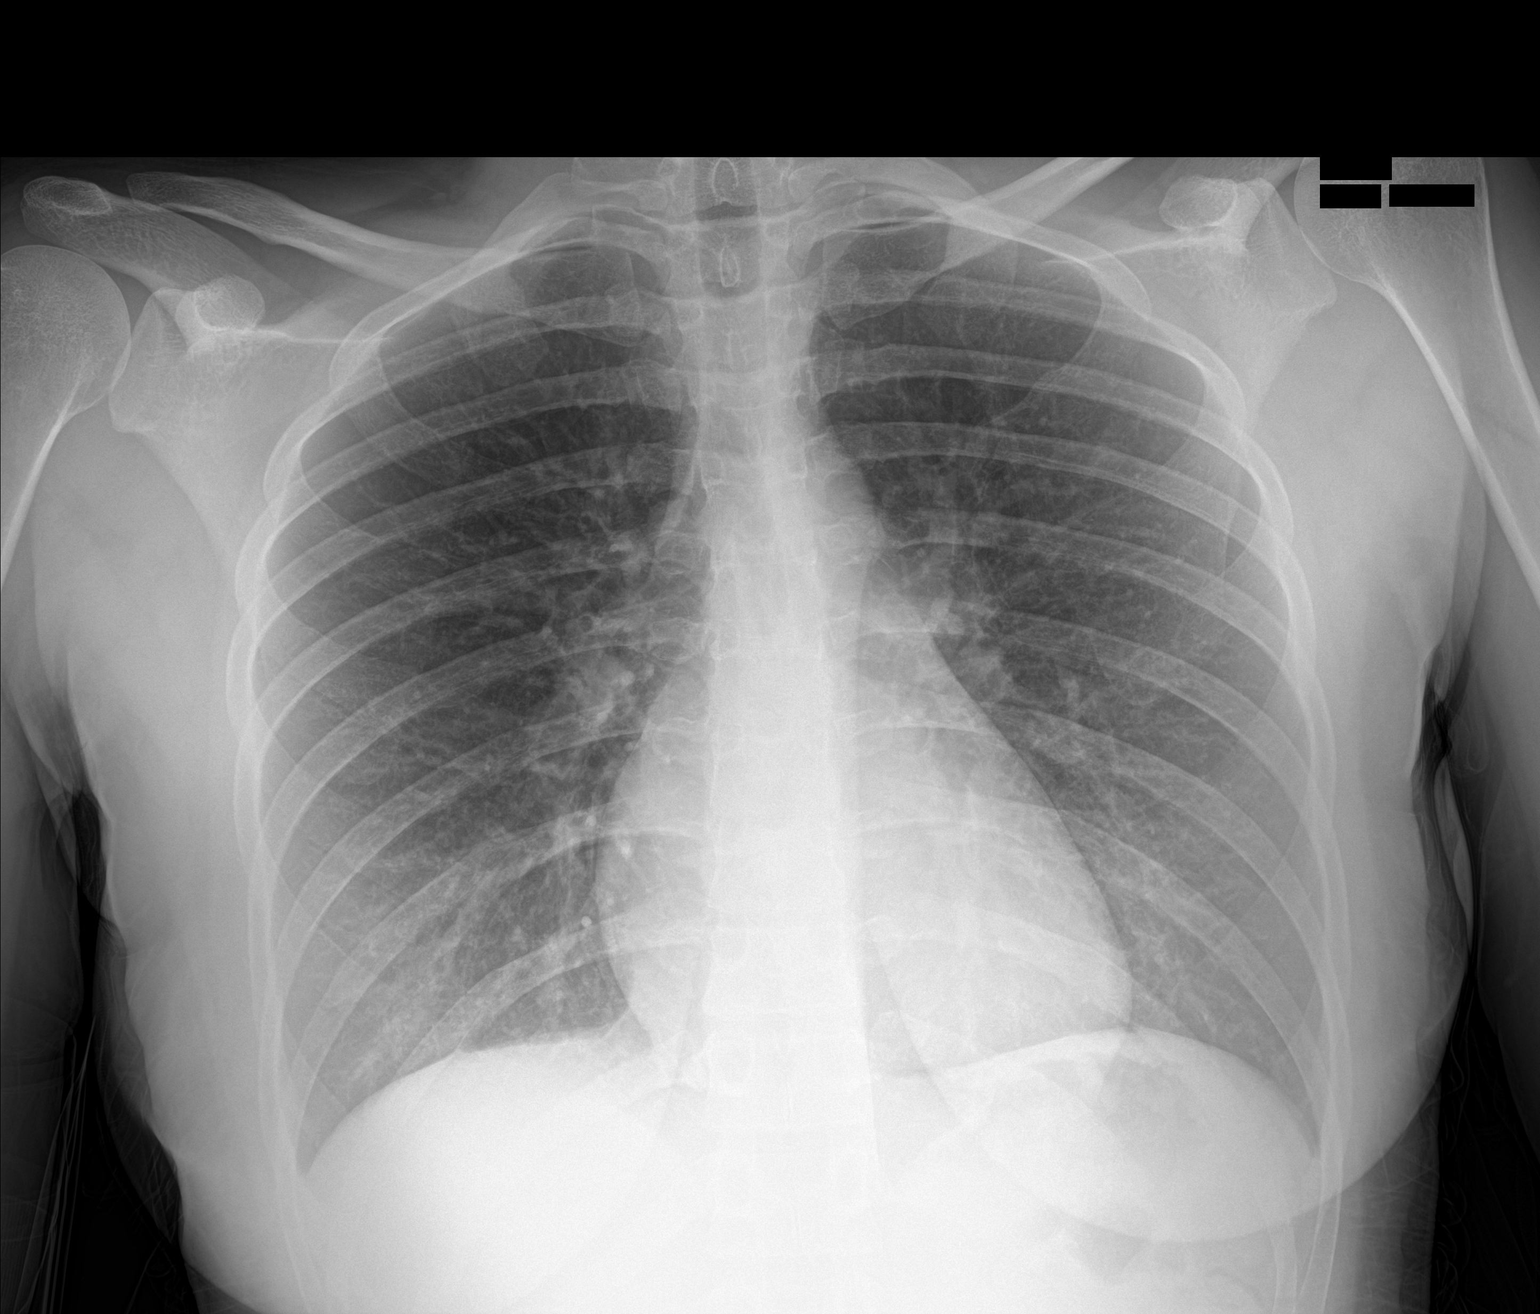

[1 of 1 positions shown; findings below may reference images not displayed]

FINDINGS: The heart size and mediastinal contours are within normal limits.
Both lungs are clear. The visualized skeletal structures are
unremarkable.
IMPRESSION: No active disease.
